# Patient Record
Sex: Male | Born: 1940 | Race: White | Hispanic: No | Marital: Married | Smoking: Current some day smoker
Health system: Southern US, Community
[De-identification: ages and names within clinical notes are randomized; demographics above are authoritative.]

## PROBLEM LIST (undated history)

## (undated) DIAGNOSIS — T8859XA Other complications of anesthesia, initial encounter: Secondary | ICD-10-CM

## (undated) DIAGNOSIS — T4145XA Adverse effect of unspecified anesthetic, initial encounter: Secondary | ICD-10-CM

## (undated) DIAGNOSIS — E119 Type 2 diabetes mellitus without complications: Secondary | ICD-10-CM

## (undated) DIAGNOSIS — G4730 Sleep apnea, unspecified: Secondary | ICD-10-CM

## (undated) DIAGNOSIS — K219 Gastro-esophageal reflux disease without esophagitis: Secondary | ICD-10-CM

## (undated) DIAGNOSIS — F32A Depression, unspecified: Secondary | ICD-10-CM

## (undated) DIAGNOSIS — M1990 Unspecified osteoarthritis, unspecified site: Secondary | ICD-10-CM

## (undated) DIAGNOSIS — I2510 Atherosclerotic heart disease of native coronary artery without angina pectoris: Secondary | ICD-10-CM

## (undated) DIAGNOSIS — F329 Major depressive disorder, single episode, unspecified: Secondary | ICD-10-CM

## (undated) DIAGNOSIS — M549 Dorsalgia, unspecified: Secondary | ICD-10-CM

## (undated) DIAGNOSIS — I214 Non-ST elevation (NSTEMI) myocardial infarction: Secondary | ICD-10-CM

## (undated) DIAGNOSIS — G8929 Other chronic pain: Secondary | ICD-10-CM

## (undated) DIAGNOSIS — I10 Essential (primary) hypertension: Secondary | ICD-10-CM

## (undated) DIAGNOSIS — M797 Fibromyalgia: Secondary | ICD-10-CM

## (undated) DIAGNOSIS — IMO0001 Reserved for inherently not codable concepts without codable children: Secondary | ICD-10-CM

## (undated) DIAGNOSIS — Z87891 Personal history of nicotine dependence: Secondary | ICD-10-CM

## (undated) DIAGNOSIS — E785 Hyperlipidemia, unspecified: Secondary | ICD-10-CM

## (undated) DIAGNOSIS — K760 Fatty (change of) liver, not elsewhere classified: Secondary | ICD-10-CM

## (undated) DIAGNOSIS — F419 Anxiety disorder, unspecified: Secondary | ICD-10-CM

## (undated) HISTORY — PX: OTHER SURGICAL HISTORY: SHX169

## (undated) HISTORY — PX: SHOULDER SURGERY: SHX246

## (undated) HISTORY — PX: ROTATOR CUFF REPAIR: SHX139

## (undated) HISTORY — DX: Depression, unspecified: F32.A

## (undated) HISTORY — PX: CHOLECYSTECTOMY: SHX55

## (undated) HISTORY — DX: Atherosclerotic heart disease of native coronary artery without angina pectoris: I25.10

## (undated) HISTORY — PX: PAROTIDECTOMY: SUR1003

## (undated) HISTORY — DX: Hyperlipidemia, unspecified: E78.5

## (undated) HISTORY — PX: TRANSURETHRAL RESECTION OF PROSTATE: SHX73

## (undated) HISTORY — PX: NASAL SINUS SURGERY: SHX719

## (undated) HISTORY — DX: Fatty (change of) liver, not elsewhere classified: K76.0

## (undated) HISTORY — DX: Unspecified osteoarthritis, unspecified site: M19.90

## (undated) HISTORY — DX: Major depressive disorder, single episode, unspecified: F32.9

---

## 2004-07-07 ENCOUNTER — Ambulatory Visit (HOSPITAL_COMMUNITY): Admission: RE | Admit: 2004-07-07 | Discharge: 2004-07-07 | Payer: Self-pay | Admitting: Orthopedic Surgery

## 2004-08-18 ENCOUNTER — Ambulatory Visit (HOSPITAL_COMMUNITY): Admission: RE | Admit: 2004-08-18 | Discharge: 2004-08-18 | Payer: Self-pay | Admitting: Orthopedic Surgery

## 2004-09-29 ENCOUNTER — Encounter: Admission: RE | Admit: 2004-09-29 | Discharge: 2004-09-29 | Payer: Self-pay | Admitting: Orthopedic Surgery

## 2004-10-12 ENCOUNTER — Encounter: Admission: RE | Admit: 2004-10-12 | Discharge: 2004-10-12 | Payer: Self-pay | Admitting: Orthopedic Surgery

## 2004-10-29 ENCOUNTER — Encounter: Admission: RE | Admit: 2004-10-29 | Discharge: 2004-10-29 | Payer: Self-pay | Admitting: Orthopedic Surgery

## 2005-04-01 ENCOUNTER — Ambulatory Visit (HOSPITAL_COMMUNITY): Admission: RE | Admit: 2005-04-01 | Discharge: 2005-04-01 | Payer: Self-pay | Admitting: Orthopedic Surgery

## 2006-01-03 ENCOUNTER — Observation Stay (HOSPITAL_COMMUNITY): Admission: AD | Admit: 2006-01-03 | Discharge: 2006-01-04 | Payer: Self-pay | Admitting: Neurological Surgery

## 2006-10-07 ENCOUNTER — Emergency Department (HOSPITAL_COMMUNITY): Admission: EM | Admit: 2006-10-07 | Discharge: 2006-10-07 | Payer: Self-pay | Admitting: Emergency Medicine

## 2007-04-05 ENCOUNTER — Ambulatory Visit: Payer: Self-pay | Admitting: Internal Medicine

## 2007-04-11 ENCOUNTER — Ambulatory Visit: Payer: Self-pay | Admitting: Gastroenterology

## 2007-04-11 ENCOUNTER — Encounter: Payer: Self-pay | Admitting: Gastroenterology

## 2007-04-15 ENCOUNTER — Emergency Department (HOSPITAL_COMMUNITY): Admission: EM | Admit: 2007-04-15 | Discharge: 2007-04-15 | Payer: Self-pay | Admitting: Family Medicine

## 2007-08-31 ENCOUNTER — Encounter (INDEPENDENT_AMBULATORY_CARE_PROVIDER_SITE_OTHER): Payer: Self-pay | Admitting: *Deleted

## 2007-08-31 ENCOUNTER — Observation Stay (HOSPITAL_COMMUNITY): Admission: EM | Admit: 2007-08-31 | Discharge: 2007-08-31 | Payer: Self-pay | Admitting: Emergency Medicine

## 2008-03-08 ENCOUNTER — Ambulatory Visit (HOSPITAL_BASED_OUTPATIENT_CLINIC_OR_DEPARTMENT_OTHER): Admission: RE | Admit: 2008-03-08 | Discharge: 2008-03-08 | Payer: Self-pay | Admitting: Otolaryngology

## 2008-03-08 ENCOUNTER — Encounter (INDEPENDENT_AMBULATORY_CARE_PROVIDER_SITE_OTHER): Payer: Self-pay | Admitting: Otolaryngology

## 2009-06-02 ENCOUNTER — Ambulatory Visit: Payer: Self-pay | Admitting: Surgery

## 2009-06-19 ENCOUNTER — Encounter: Admission: RE | Admit: 2009-06-19 | Discharge: 2009-06-19 | Payer: Self-pay | Admitting: Internal Medicine

## 2009-06-25 ENCOUNTER — Encounter: Admission: RE | Admit: 2009-06-25 | Discharge: 2009-06-25 | Payer: Self-pay | Admitting: Internal Medicine

## 2009-10-06 ENCOUNTER — Encounter: Admission: RE | Admit: 2009-10-06 | Discharge: 2009-10-06 | Payer: Self-pay | Admitting: Neurological Surgery

## 2010-04-16 ENCOUNTER — Encounter: Admission: RE | Admit: 2010-04-16 | Discharge: 2010-04-16 | Payer: Self-pay | Admitting: Internal Medicine

## 2010-08-12 ENCOUNTER — Emergency Department (HOSPITAL_COMMUNITY)
Admission: EM | Admit: 2010-08-12 | Discharge: 2010-08-12 | Disposition: A | Discharge status: Home / Self Care | Payer: Medicare Other | Attending: Emergency Medicine | Admitting: Emergency Medicine

## 2010-08-12 ENCOUNTER — Emergency Department (HOSPITAL_COMMUNITY): Payer: Medicare Other

## 2010-08-12 DIAGNOSIS — I4460 Unspecified fascicular block: Secondary | ICD-10-CM | POA: Insufficient documentation

## 2010-08-12 DIAGNOSIS — IMO0001 Reserved for inherently not codable concepts without codable children: Secondary | ICD-10-CM | POA: Insufficient documentation

## 2010-08-12 DIAGNOSIS — G319 Degenerative disease of nervous system, unspecified: Secondary | ICD-10-CM | POA: Insufficient documentation

## 2010-08-12 DIAGNOSIS — G4730 Sleep apnea, unspecified: Secondary | ICD-10-CM | POA: Insufficient documentation

## 2010-08-12 DIAGNOSIS — R42 Dizziness and giddiness: Secondary | ICD-10-CM | POA: Insufficient documentation

## 2010-08-12 DIAGNOSIS — E860 Dehydration: Secondary | ICD-10-CM | POA: Insufficient documentation

## 2010-08-12 LAB — CBC
HCT: 43.5 % (ref 39.0–52.0)
Hemoglobin: 15 g/dL (ref 13.0–17.0)
MCV: 90.6 fL (ref 78.0–100.0)
RDW: 13.3 % (ref 11.5–15.5)
WBC: 5.4 10*3/uL (ref 4.0–10.5)

## 2010-08-12 LAB — BASIC METABOLIC PANEL
BUN: 24 mg/dL — ABNORMAL HIGH (ref 6–23)
Calcium: 9.1 mg/dL (ref 8.4–10.5)
Chloride: 105 mEq/L (ref 96–112)
Creatinine, Ser: 0.95 mg/dL (ref 0.4–1.5)
GFR calc Af Amer: >60 mL/min (ref >60)
GFR calc non Af Amer: >60 mL/min (ref >60)

## 2010-08-12 LAB — URINALYSIS, ROUTINE W REFLEX MICROSCOPIC
Ketones, ur: NEGATIVE mg/dL
Protein, ur: NEGATIVE mg/dL
Urobilinogen, UA: 1 mg/dL (ref 0.0–1.0)

## 2010-08-12 LAB — DIFFERENTIAL
Basophils Absolute: 0 10*3/uL (ref 0.0–0.1)
Eosinophils Relative: 3 % (ref 0–5)
Lymphocytes Relative: 29 % (ref 12–46)
Lymphs Abs: 1.6 10*3/uL (ref 0.7–4.0)
Neutro Abs: 3.1 10*3/uL (ref 1.7–7.7)

## 2010-08-12 LAB — POCT CARDIAC MARKERS
CKMB, poc: <1 ng/mL — ABNORMAL LOW (ref 1.0–8.0)
Troponin i, poc: <0.05 ng/mL (ref 0.00–0.09)

## 2010-08-12 LAB — PROTIME-INR: INR: 0.97 (ref 0.00–1.49)

## 2010-08-12 LAB — GLUCOSE, CAPILLARY: Glucose-Capillary: 109 mg/dL — ABNORMAL HIGH (ref 70–99)

## 2010-10-13 NOTE — Discharge Summary (Signed)
NAME:  Timothy Cervantes, Timothy Cervantes              ACCOUNT NO.:  [id number]   MEDICAL RECORD NO.:  [id number]          PATIENT TYPE:  INP   LOCATION:  4729                         FACILITY:  [facility]   PHYSICIAN:  Thomasenia Bottoms, MDDATE OF BIRTH:  [birth year]   DATE OF ADMISSION:  08/31/2007  DATE OF DISCHARGE:  08/31/2007                               DISCHARGE SUMMARY   ADDENDUM   FOLLOWUP INSTRUCTIONS:  We will ask the patient's primary care physician  to follow up on the patient's echocardiogram, which is still pending at  the time of discharge.  The patient says he does have a known leaky  heart valve.      Thomasenia Bottoms, MD  Electronically Signed     CVC/MEDQ  D:  08/31/2007  T:  09/01/2007  Job:  [id number]

## 2010-10-13 NOTE — Op Note (Signed)
NAME:  Timothy Cervantes, Timothy Cervantes              ACCOUNT NO.:  [id number]   MEDICAL RECORD NO.:  [id number]          PATIENT TYPE:  AMB   LOCATION:  DSC                          FACILITY:  [facility]   PHYSICIAN:  Christopher E. 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   POSTOPERATIVE DIAGNOSIS:  Hoarseness with left true vocal cord lesion.   OPERATION PERFORMED:  Microlaryngoscopy with excision of left vocal cord  lesion.   SURGEON:  Kristine Garbe. Ezzard Standing, MD   ANESTHESIA:  General endotracheal.   COMPLICATIONS:  None.   CLINICAL NOTE:  The Timothy Cervantes is a 70 year old gentleman who  presents with sore throat and intermittent hoarseness.  He has a  significant smoking history.  On examination in the office, he has a  lesion arising from the left mid true vocal cord.  Remaining exam is  clear.  He does have history of reflux problems.  He is taken to the  operating room at this time for microlaryngoscopy and excision of left  vocal cord lesion.   DESCRIPTION OF PROCEDURE:  After adequate endotracheal anesthesia,  direct laryngoscopy was performed.  On direct laryngoscopy, the  endotracheal tube was partially extruded and the patient was reintubated  over a guide without difficulty.  Following this, direct laryngoscopy  was performed.  The laryngoscope was suspended.  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  This was excised from the vocal core using scissors.  It was sent  to pathology.  Hemostasis was obtained with cotton pledgets soaked in  adrenaline.  Remaining AE folds, false cords were clear.  The right  vocal cord was normal.  Anterior commissure was clear.  Subglottic area  was clear.  Both pyriform sinuses were clear.  Palpation at the base of  tongue  was soft.  Epiglottis and the base of tongue appeared normal.  This completed the procedure.  Haeden was awoke from anesthesia and  transferred to the recovery room, postop doing well.   DISPOSITION:  Timothy Cervantes is discharged home later this morning on antacid,  Nexium b.i.d. for the next month and Tylenol p.r.n. pain.  Timothy Cervantes will  follow up in my office in 10 days for recheck.           ______________________________  Kristine Garbe. Ezzard Standing, M.D.     CEN/MEDQ  D:  03/08/2008  T:  03/08/2008  Job:  [id number]

## 2010-10-13 NOTE — Discharge Summary (Signed)
NAME:  Timothy Cervantes, Timothy Cervantes              ACCOUNT NO.:  [id number]   MEDICAL RECORD NO.:  [id number]          PATIENT TYPE:  INP   LOCATION:  4729                         FACILITY:  [facility]   PHYSICIAN:  Thomasenia Bottoms, MDDATE OF BIRTH:  1941/01/27   DATE OF ADMISSION:  08/31/2007  DATE OF DISCHARGE:  08/31/2007                               DISCHARGE SUMMARY   SUMMARY OF HOSPITAL COURSE:  Mr. [name] is a 70 year old gentleman who  had some chest pressure on arrival, the patient was admitted to a  telemetry unit.  He was ruled out for MI by cardiac enzymes.  He had no  further chest pressure and did quite well from the cardiac standpoint.  He did not have any arrhythmias on telemetry.  The patient's second  complaint was testicular pain and ultrasound of his testicles was  ordered.  The ultrasound revealed a small right hydrocele and a small  right varicocele.  This was discussed with the urologist.  It was  recommended to the patient to take 3 weeks of p.o. Cipro and then  followup with urology for a followup ultrasound.  A low level  epididymitis could be the cause.  The ultrasound was negative for any  torsion or masses.  The patient did have a slight testicular injury  recently.   DISCHARGE DIAGNOSES:  1. Chest pressure, ruled out for MI.  2. Right testicular hydrocele and varicocele, possible mild      epididymitis.  3. Known sleep apnea.  4. Tobacco abuse.  5. Fibromyalgia.   MEDICATIONS:  At the time of discharge include:  1. Cipro extended release 500 mg for 3 weeks.  2. Wellbutrin XR 150 mg daily, which he takes for fibromyalgia.  3. Ambien 10 mg p.o. nightly.   DISCHARGE INSTRUCTIONS:  The patient was advised to quit smoking.   FOLLOWUP:  He should follow up with his [facility] physician  within 3 weeks to have a repeat ultrasound.  He should follow up with  Dr. Ricki Miller, his primary care physician.      Thomasenia Bottoms, MD  Electronically Signed     CVC/MEDQ  D:  08/31/2007  T:  09/01/2007  Job:  [id number]   cc:   [name], M.D.
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
## 2010-10-13 NOTE — H&P (Signed)
NAME:  Timothy Cervantes, [name]              ACCOUNT NO.:  [id number]   MEDICAL RECORD NO.:  [id number]          PATIENT TYPE:  INP   LOCATION:  4729                         FACILITY:  [facility]   PHYSICIAN:  Beckey Rutter, MD  DATE OF BIRTH:  02-14-1941   DATE OF ADMISSION:  08/31/2007  DATE OF DISCHARGE:  06/23/2007                              HISTORY & PHYSICAL   PRIMARY CARE PHYSICIAN:  Dr. Brennan Bailey.   CHIEF COMPLAINT:  Chest pain   HISTORY OF PRESENT ILLNESS:  This is a 70 year old Caucasian male with  past medical history significant for fibromyalgia and sleep apnea who  came today because of chest discomfort.  The chest pain started around  four hours ago and continued for three hours.  The pressure started  towards back going forwards.  This discomfort and pressure is associated  with some warm sensation on the upper part of his body.  No palpitation,  no sweating.  The pain is relieved currently, but the pain has not  changed after nitroglycerin was given.  There is no radiation factor.  The patient does not remember any significant activity he was doing when  the pain was started.  No similar complaint or pain like this  previously.   PAST MEDICAL HISTORY:  1. Fibromyalgia.  2. Sleep apnea.   MEDICATION ALLERGIES:  Not known to have medication allergy.   MEDICATIONS:  Wellbutrin and Ambien h.s.   SOCIAL HISTORY:  Current smoker.  No drug abuse.  Occasional drinker.   FAMILY HISTORY:  Noncontributory.   REVIEW OF SYSTEMS:  A 12-point review of systems is not significant  otherwise as per HPI.   PHYSICAL EXAMINATION:  VITAL SIGNS:  Temperature 98.4, blood pressure  116/65, pulse 58, respiratory rate is 18.  HEENT:  Head atraumatic, normocephalic.  Eyes: PERRL.  Mouth moist.  No  ulcer.  NECK:  Supple, no JVD.  Precordium first and second heart sound.  No  added sound appreciated.  LUNGS:  Bilateral decreased air entry.  ABDOMEN:  Soft, nontender.  EXTREMITIES:  No lower  extremity edema.  NEUROLOGICALLY:  Alert, oriented x3.  Given history, moving all his  extremities spontaneously.   LABORATORY DATA:  Labs and x-ray:  EKG showing sinus rhythm with  ventricular rate of 70 beats per minute.  Left axis deviation and  incomplete right bundle branch block.  The patient had nonspecific ST  and T-wave changes.  There is a previous EKG dated March 29, 2008.  That EKG was showing nonspecific ST and T-wave change.  D-dimer is 0.48.  chest x-ray showing mild airway thickening, suggest bronchitis or  reactive airway disease.  No pneumonia is identified.  White blood count  is 6.6, hemoglobin is 15.6, hematocrit is 45.0, platelet count is 264.  Troponin is less than 0.05, CK-MB is 2.1 and myoglobin is 51.1.  Sodium  is 138, potassium 3.8, chloride is 104, bicarb is 22, BUN is 22,  creatinine is 1.2, glucose 101.   ASSESSMENT:  This is 70 year old male with history of fibromyalgia and  sleep apnea who presented today with chest pressure.  The differential  include bronchitis or acute coronary syndromes.   PLAN:  1. The patient will be admitted for further assessment and management.  2. Will admit the patient to telemetry floor and he will have EKG      every 8 hours.  3. The patient will have cardiac enzymes cycled.  4. The patient will be given aspirin and sublingual nitroglycerin on a      p.r.n. basis.  5. We will obtain 2-D echo during hospitalization.  6. The patient will be continued on Wellbutrin during the hospital      stay.  7. For DVT prophylaxis will start Lovenox.  8. For GI prophylaxis will start Protonix.      Beckey Rutter, MD  Electronically Signed     EME/MEDQ  D:  08/31/2007  T:  08/31/2007  Job:  [id number]

## 2010-10-13 NOTE — Discharge Summary (Signed)
NAME:  Timothy Cervantes, EKSTEIN              ACCOUNT NO.:  [id number]   MEDICAL RECORD NO.:  [id number]          PATIENT TYPE:  INP   LOCATION:  4729                         FACILITY:  [facility]   PHYSICIAN:  Thomasenia Bottoms, MDDATE OF BIRTH:  1940-08-07   DATE OF ADMISSION:  08/31/2007  DATE OF DISCHARGE:  08/31/2007                               DISCHARGE SUMMARY   ADDENDUM   The patient did have a echocardiogram done during this hospital stay,  but the results are still pending at the time of discharge.  We will ask  Dr. Ricki Miller to follow up on his echocardiogram results at the time of post  discharge.  The patient does have a   Dictation ended at this point.      Thomasenia Bottoms, MD  Electronically Signed     CVC/MEDQ  D:  08/31/2007  T:  09/01/2007  Job:  [id number]

## 2010-10-16 NOTE — H&P (Signed)
NAME:  Timothy Cervantes              ACCOUNT NO.:  [id number]   MEDICAL RECORD NO.:  [id number]          PATIENT TYPE:  INP   LOCATION:  3027                         FACILITY:  [facility]   PHYSICIAN:  Stefani Dama, M.D.  DATE OF BIRTH:  07-Apr-1941   DATE OF ADMISSION:  01/03/2006  DATE OF DISCHARGE:                                HISTORY & PHYSICAL   ADMISSION DIAGNOSIS:  Thoracic and lumbar spondylosis, nephrolithiasis.   HISTORY OF PRESENT ILLNESS:  Timothy Cervantes is a 70 year old right-handed  individual who has been followed by me for problems with lumbar spondylosis  and lumbar radiculopathy involving the right lower extremity.  A week ago a  underwent an extraforaminal nerve block, which gave him good relief of his  right lumbar radicular pain.  On Saturday he developed rather acute onset of  spasm in the region of the right flank and he notes that the spasms have  been occurring in waves since that time.  He notes that he holds perfectly  still he can be comfortable for a brief period of time but then when the  spasms grasp him, they radiate up and down the flank on the right side.  He  notes that he has had some nausea but has not vomited.  Pain has been severe  and unrelenting.  It has been unresponsive to the use of cyclobenzaprine.  He contacted me this morning and I visited with him in the office and  obtained an AP and lateral radiograph of the thoracolumbar spine.  Radiographs demonstrate that he has spondylitic changes in the upper  thoracic spine, namely at T7, T8, T9 with some slight collapse that appears  chronic in nature.  There is spondylitic ridging from the ventral aspect of  the vertebral bodies.  I do not appreciate an acute fracture on these  radiographs.  In the lumbar spine off the lateral edge of the L2 vertebrae  there may be either a small stone or possibly a bony spur.  His alignment in  the coronal and sagittal planes is quite good otherwise, and I do not  see  any other acute process.  The patient is in severe and unrelenting pain.  He  is unable to stand straight and erect without great difficulty.  He has  acute tenderness to percussion.  He is now being admitted to undergo further  workup.   PAST MEDICAL HISTORY:  He has the recent diagnosis of diabetes mellitus that  is largely diet-controlled.  He has been on some oral hypoglycemics in the  recent past.  He tells me that he had about a 60-pound weight loss in the  past 8 months because of the diagnosis.  He does smoke about a half pack of  cigarettes a day, does not drink alcohol.   His systems review is notable for the pain in the right flank, chronic pain  in a right radicular distribution of the L5 nerve root, which is treated  intermittently with nerve blocks.   PHYSICAL EXAMINATION:  MUSCULOSKELETAL/NEUROLOGIC:  His physical exam at  this time reveals that he is  alert, oriented and in moderate to severe  distress, hunched over with back pain.  When he has a spasm of back pain, it  literally brings him to his knees.  The patient writhes with pain until the  spasm passes, which may last anywhere 15 seconds to a minute or so.  In  between spasms he notes that there is a chronic undulating pain in the  region of the right flank.  There is no direct tenderness to palpation  either along the spinous processes of the lumbar spine, but on percussion  there is direct tenderness in the flank on the right side.  The Left side  has no such tenderness.  His motor strength in the lower extremities is good  in the iliopsoas, the quadriceps, tibialis anterior and the gastrocs.  The  deep tendon reflexes are 2+ in the patellae, 1+ in the Achilles.  Babinski  is downgoing.  Upper extremity strength and reflexes are within limits of  normal.  LUNGS:  Clear to auscultation.  CARDIAC:  Heart has regular rate and rhythm.  ABDOMEN:  Soft.  Bowel sounds positive.  No masses are noted.  EXTREMITIES:   No cyanosis, clubbing or edema.   IMPRESSION:  The patient has evidence of right flank pain, which is quite  severe.  I strongly suspect this is related to nephrolithiasis.  The patient  will be admitted now for pain control with parenteral medications in  addition to undergoing a CT scan of the abdomen and pelvis to rule out a  kidney stone.  A UA will also be performed.  Will also obtain some baseline  blood work including a CBC and a CMET.  [facility].  The patient did have a visit with Dr. Retta Diones about 2 years ago for an  episode of painless hematuria.  Apparently his workup was negative at that  time.  We will see how he does with the parenteral control of pain and CT  scan.      Stefani Dama, M.D.  Electronically Signed     HJE/MEDQ  D:  01/03/2006  T:  01/03/2006  Job:  [id number]

## 2010-10-16 NOTE — Op Note (Signed)
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   PROCEDURE:  Left shoulder arthroscopy with debridement of the flap tear,  debridement of rotator cuff tear, debridement of partial subscapularis tear  and subacromial decompression.   SURGEON:  Nadara Mustard, MD   ANESTHESIA:  General plus interscalene block.   ESTIMATED BLOOD LOSS:  Minimal.   DISPOSITION:  To PACU in stable condition.  Plan for discharge to home and  followup in office in 2 weeks.   HISTORY OF PRESENT ILLNESS:  The patient is a 70 year old gentleman with  impingement symptoms of his left shoulder.  The patient has failed  conservative care presents at this time for arthroscopic intervention.  The  risks and benefits were discussed including infection, neurovascular injury,  persistent pain, and need for additional surgery.  The patient states he  understands and wishes proceed at this time.   DESCRIPTION OF PROCEDURE:  The patient was brought to OR room #5 and  underwent a general anesthetic after an interscalene block.  After adequate  level of anesthesia was obtained, the patient was placed in the beach-chair  position and his left upper extremity was prepped using DuraPrep and draped  into a sterile field.  The scope was inserted through the posterior portal  and an anterior portal was established from an outside-in technique  with an  18-gauge spinal and a plastic cannula was placed through the anterior  portal.  Visualization showed multiple areas of synovitis and partial  tearing of the rotator cuff.  This was debrided.  There was a good  attachment at the humeral head of the rotator cuff and this was stable.  The  patient had some tearing of the attachment of the biceps as well as a SLAP  lesion; this was debrided back to a healthy viable edge.  There was also  some partial tearing of the subscapularis and this was also debrided.  The  rotator interval was debrided of synovitis.  After hemostasis was obtained  with the electrocautery, the instruments were removed and the lateral portal  was established and a posterior portal was established for the subacromial  space.  Visualization showed a very hooked type 3 acromion with very tight  subacromial space.  A bursectomy was performed.  A subacromial decompression  was performed.  The electrocautery was used for hemostasis.  The instruments  were removed.  The portals were closed using 2-0 nylon.  The wounds were covered with Adaptic, orthopedic sponges, ABD dressing  and  Hypafix tape.  The patient was extubated, placed in a sling and taken to the  PACU in stable condition.  Plan to follow up in office in 2 weeks and change  the dressing in 2 days.      Nadara Mustard, MD  Electronically Signed     MVD/MEDQ  D:  04/01/2005  T:  04/02/2005  Job:  [id number]

## 2010-10-16 NOTE — Consult Note (Signed)
NAME:  Timothy Cervantes, Timothy Cervantes              ACCOUNT NO.:  [id number]   MEDICAL RECORD NO.:  [id number]          PATIENT TYPE:  INP   LOCATION:  3027                         FACILITY:  [facility]   PHYSICIAN:  Bertram Millard. Dahlstedt, M.D.DATE OF BIRTH:  10-03-1940   DATE OF CONSULTATION:  01/03/2006  DATE OF DISCHARGE:                                   CONSULTATION   REASON FOR CONSULTATION:  Right-sided back pain.   BRIEF HISTORY AND PHYSICAL:  A 70 year old male who was admitted to the  hospital today with significant severe right flank pain.  Has been 3 days in  duration.  It is crampy at times.  It is rarely associate with nausea and he  has not had any vomiting.  He has had no fever, chills, no gross hematuria,  no change in voiding pattern.   I saw him about 2 years ago.  At that time he had pain with gross hematuria.  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  These were dilated in the office.  He has actually done well since that time and has not had any recurrent  urinary symptomatology (he was having a slow stream).  He was admitted to  the hospital today by Dr. Danielle Dess for significant pain.  He has been placed  on a PCA pump.  He has had some resolution of his pain.  CT urogram was  performed to rule out renal calculi.   PAST MEDICAL HISTORY:  Significant for BPH.  He is status post TURP in  [place] many, many years ago.  He is left with a bladder neck  contracture.  This was dilated at the last time 2 years ago.  He has had  shoulder surgery on both rotator cuffs, cholecystectomy, right leg surgery  several times in the past, and prior lumbar laminectomy.  He has spondylosis  of the lumbar spine.  He has just completed a series of epidural steroids by  Dr. Danielle Dess.   MEDICATIONS:  At the time of this dictation include Toradol, morphine, PCA,  and Valium.   SOCIAL HISTORY:  The patient does not use alcohol.  He  does use tobacco.  He  is married.   He denies any current nausea or vomiting.  He has had no change in his  urinary pattern.  He has not had any change in his bowel pattern.   EXAMINATION:  Pleasant, middle-aged male.  He was slightly groggy but in no  distress.  He had no CVA tenderness.  There were no flank masses.  Abdomen  was flat, soft, nondistended, nontender.  No mass, no organomegaly.  No  rebound or guarding.  His skin was warm.   LABORATORY DATA:  Urinalysis was clear, BMET, and CBC were basically normal.   I reviewed the patient's CT urogram.  This revealed no evident  hydronephrosis, no renal or ureteral calculi.  Bladder appeared normal.  Perhaps some calcifications in his prostate.   IMPRESSION:  1. Right flank pain, not of genitourinary etiology.  2.  History of a BPH, status post transurethral resection of the prostate,      with history of bladder neck contracture, dilated in the past and      voiding well.   PLAN:  1. I reassured the patient that no urologic evaluation or intervention is      needed at the present time.  2. He will come back to the office p.r.n. time.      Bertram Millard. Dahlstedt, M.D.  Electronically Signed     SMD/MEDQ  D:  01/03/2006  T:  01/04/2006  Job:  [id number]   cc:   [name], M.D.  Stefani Dama, M.D.

## 2011-02-23 LAB — COMPREHENSIVE METABOLIC PANEL
BUN: 16
CO2: 25
Calcium: 9
Chloride: 107
Creatinine, Ser: 0.95
GFR calc non Af Amer: >60
Glucose, Bld: 104 — ABNORMAL HIGH
Total Bilirubin: 0.7

## 2011-02-23 LAB — CK TOTAL AND CKMB (NOT AT ARMC): Total CK: 156

## 2011-02-23 LAB — APTT: aPTT: 34

## 2011-02-23 LAB — PROTIME-INR
INR: 1
Prothrombin Time: 13.2

## 2011-02-23 LAB — DIFFERENTIAL
Basophils Absolute: 0
Eosinophils Absolute: 0.2
Eosinophils Relative: 2
Lymphocytes Relative: 36
Lymphs Abs: 2.3
Neutrophils Relative %: 54

## 2011-02-23 LAB — POCT I-STAT, CHEM 8
BUN: 22
Creatinine, Ser: 1.2
Hemoglobin: 16.3
Potassium: 3.8
Sodium: 138
TCO2: 24

## 2011-02-23 LAB — POCT CARDIAC MARKERS
CKMB, poc: 2.1
Myoglobin, poc: 51.1
Operator id: 192351

## 2011-02-23 LAB — CBC
HCT: 43.7
MCV: 91.8
Platelets: 233
Platelets: 264
RDW: 13
RDW: 13.1
WBC: 5.3
WBC: 6.6

## 2011-02-23 LAB — CARDIAC PANEL(CRET KIN+CKTOT+MB+TROPI): CK, MB: 3.1

## 2011-02-23 LAB — TROPONIN I: Troponin I: <0.01

## 2011-05-05 IMAGING — CT CT ABD-PELV W/ CM
1 series · 16 of 32 positions shown, 20 images · IV contrast (omnipaque)
Comparison: 01/03/2006

CLINICAL DATA: Severe abdominal pain.  Right lower quadrant pain.
Evaluate for diverticulitis.

CT ABDOMEN AND PELVIS WITH CONTRAST
TECHNIQUE: Multidetector CT imaging of the abdomen and pelvis was
performed following the standard protocol during bolus
administration of intravenous contrast.
Contrast: 125 ml Omnipaque 300 IV.

[Series 2: renal delay · axial · delayed · 0.86mm/px · z∈[-199,-19]mm · 16 of 41 slices shown, 20 images]
[im 3/41  soft-tissue]
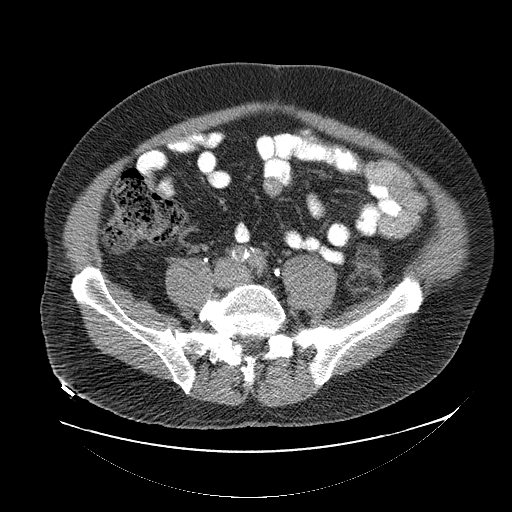
[im 3/41  bone]
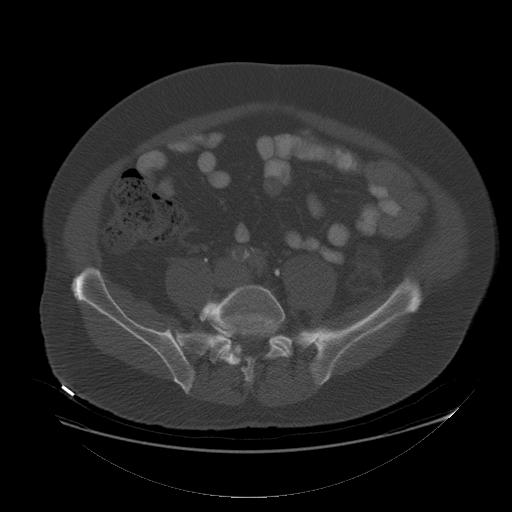
[im 6/41  soft-tissue]
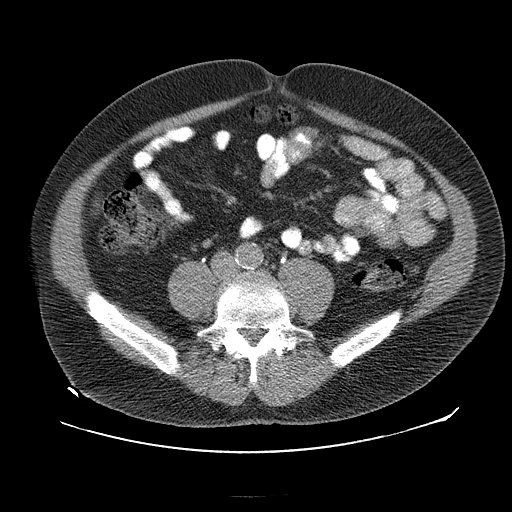
[im 8/41  soft-tissue]
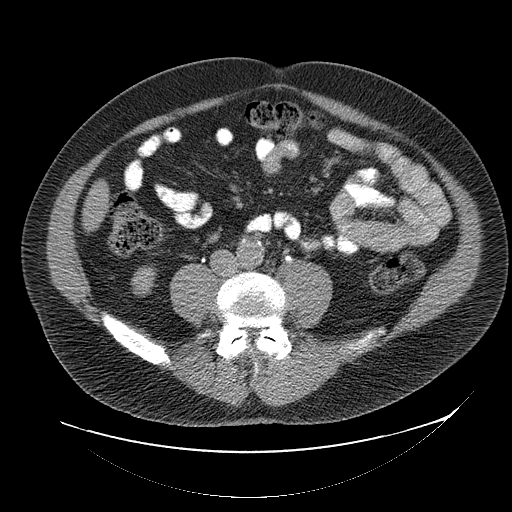
[im 11/41  soft-tissue]
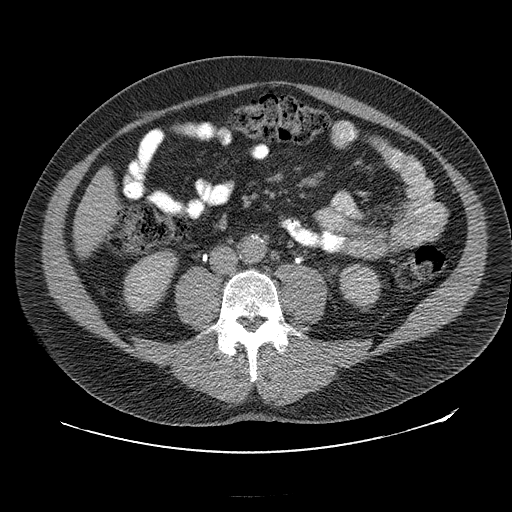
[im 13/41  soft-tissue]
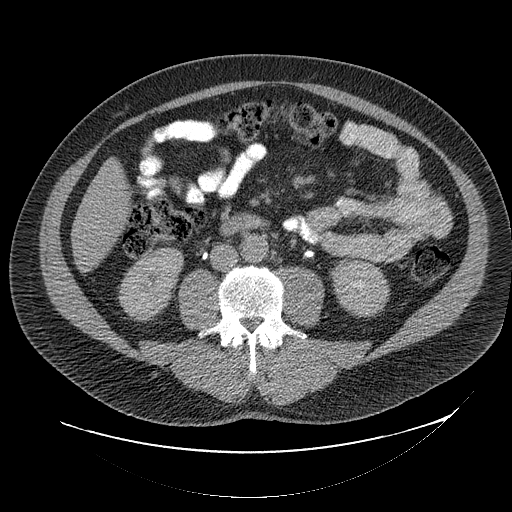
[im 16/41  soft-tissue]
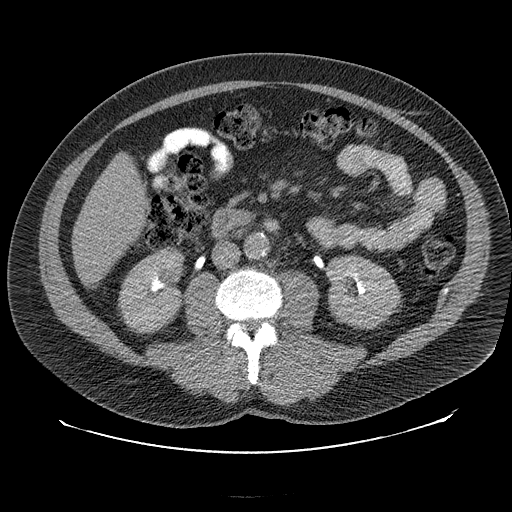
[im 19/41  soft-tissue]
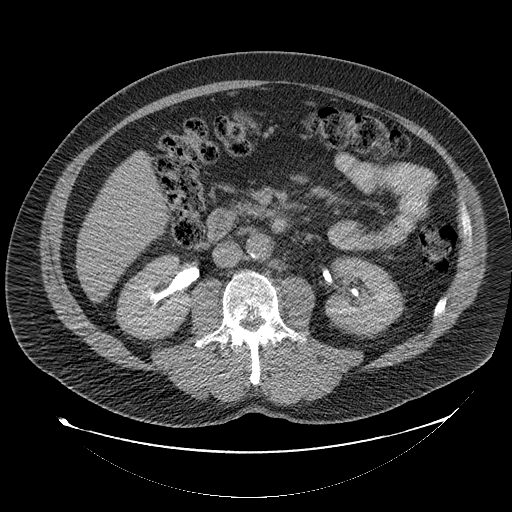
[im 22/41  soft-tissue]
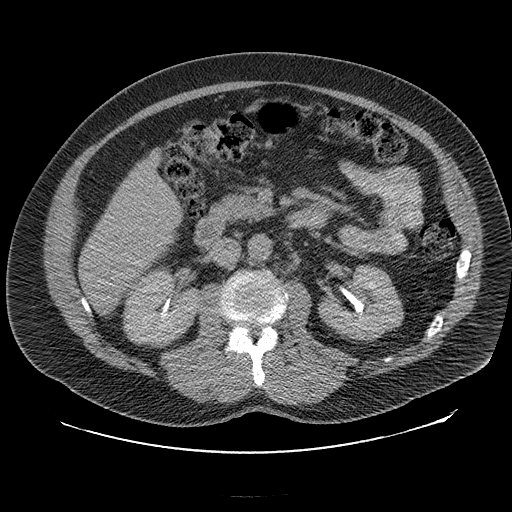
[im 25/41  soft-tissue]
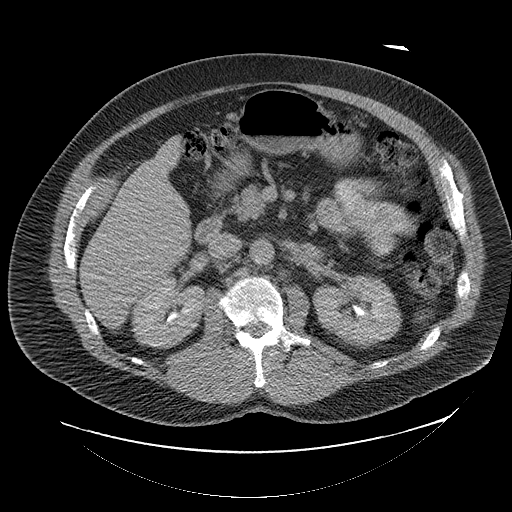
[im 25/41  bone]
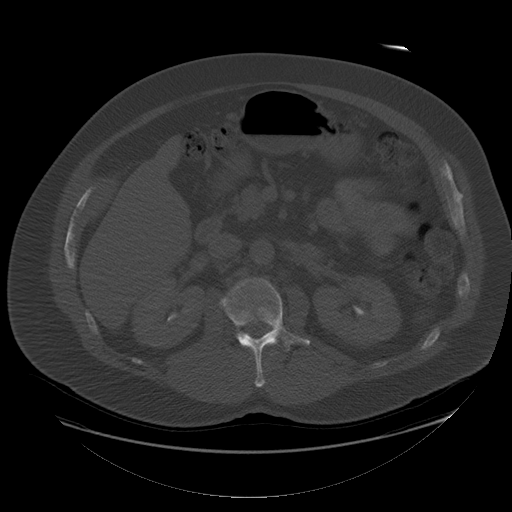
[im 28/41  soft-tissue]
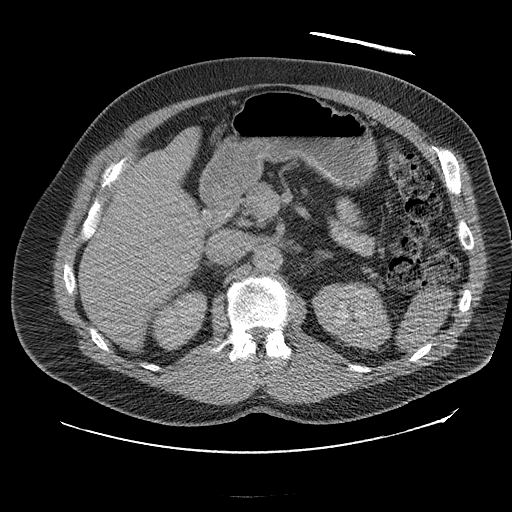
[im 30/41  soft-tissue]
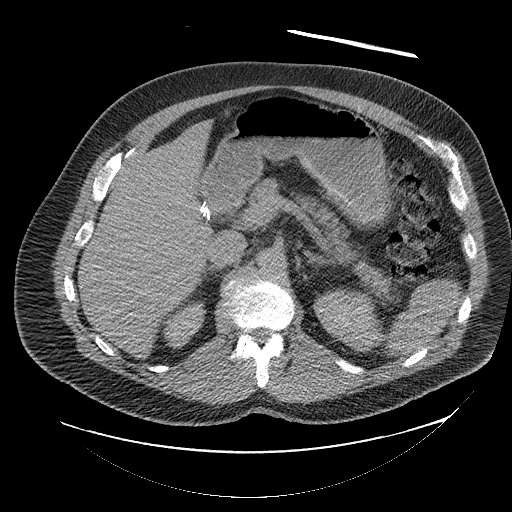
[im 33/41  soft-tissue]
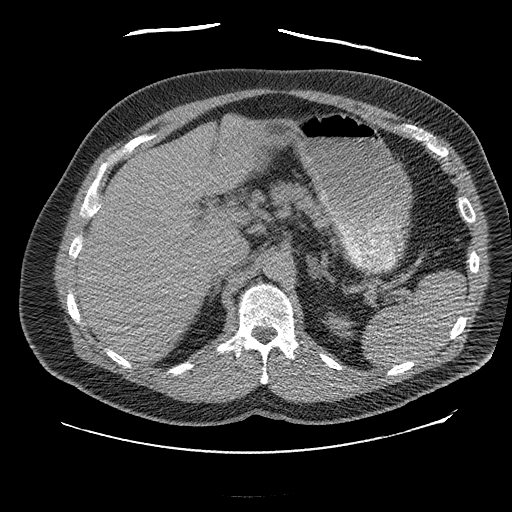
[im 35/41  soft-tissue]
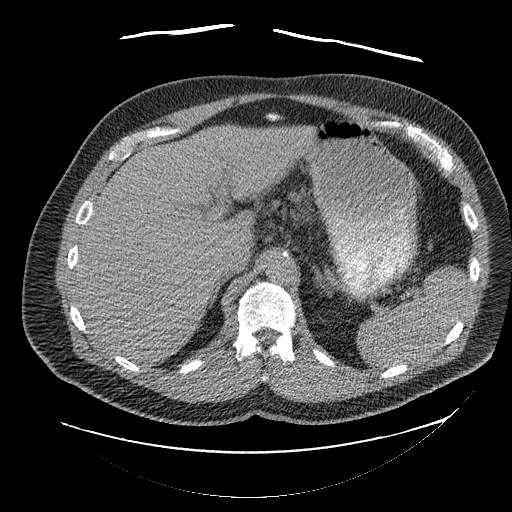
[im 35/41  lung]
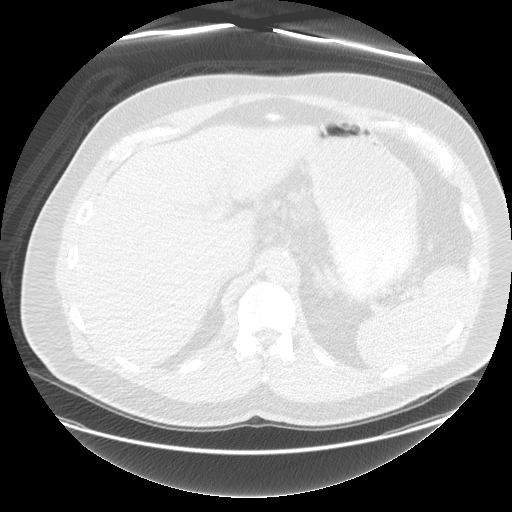
[im 37/41  lung]
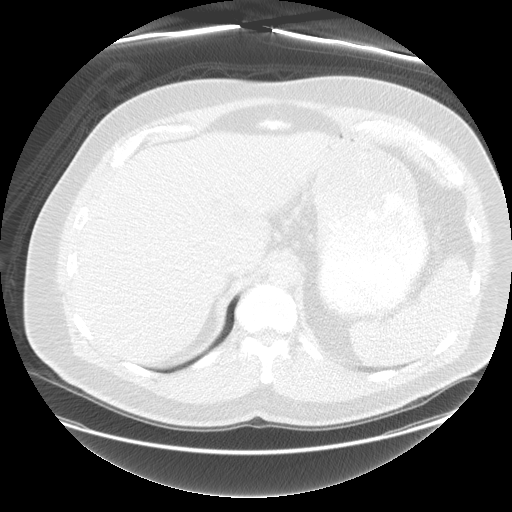
[im 38/41  soft-tissue]
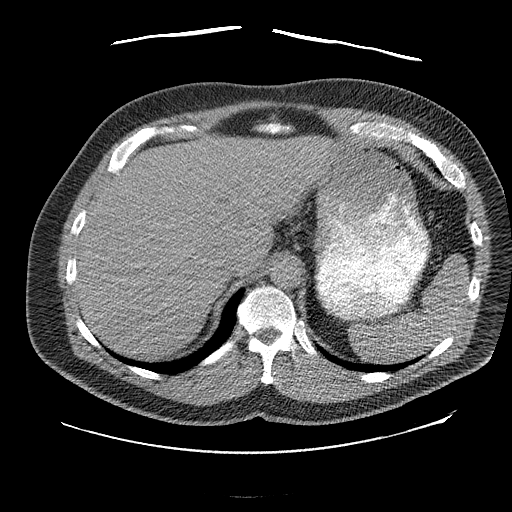
[im 38/41  lung]
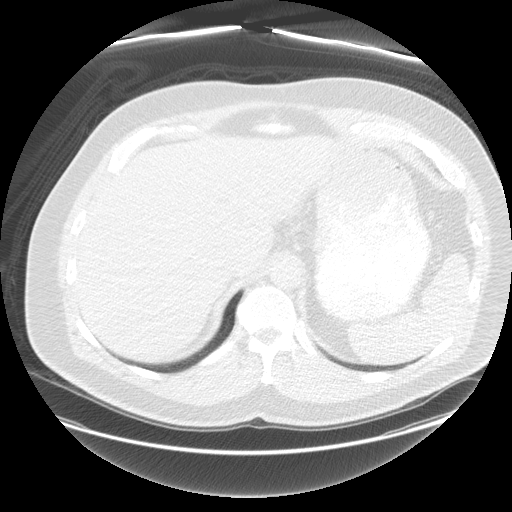
[im 39/41  lung]
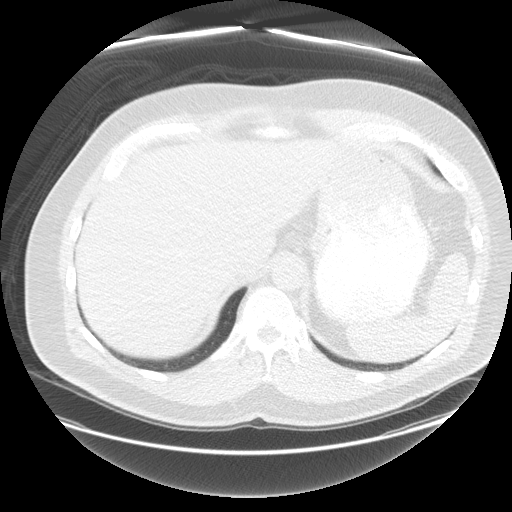

[16 of 32 positions shown; findings below may reference images not displayed]

FINDINGS: Lung bases are clear.  No effusions.  Heart is normal
size.

Prior cholecystectomy.  Slight fatty infiltration of the liver
suspected.  No focal lesion.  Spleen, pancreas, adrenals and
kidneys are unremarkable.

Appendix is visualized and is normal.  There is moderate stool
throughout the colon.  Colon otherwise unremarkable.  No
diverticulosis or diverticulitis.  Small bowel is unremarkable.

Urinary bladder, seminal vesicles and prostate are unremarkable.
No free fluid, free air or adenopathy.  Aorta is normal caliber.

No acute bony abnormality.
IMPRESSION: Prior cholecystectomy.

Moderate stool burden.

Suspect slight fatty infiltration of the liver.

No acute findings.

## 2012-03-31 ENCOUNTER — Encounter: Payer: Self-pay | Admitting: Gastroenterology

## 2012-04-14 ENCOUNTER — Other Ambulatory Visit: Payer: Self-pay | Admitting: Neurological Surgery

## 2012-04-14 DIAGNOSIS — M47816 Spondylosis without myelopathy or radiculopathy, lumbar region: Secondary | ICD-10-CM

## 2012-04-19 ENCOUNTER — Ambulatory Visit
Admission: RE | Admit: 2012-04-19 | Discharge: 2012-04-19 | Disposition: A | Discharge status: Home / Self Care | Payer: Medicare Other | Source: Ambulatory Visit | Attending: Neurological Surgery | Admitting: Neurological Surgery

## 2012-04-19 DIAGNOSIS — M47816 Spondylosis without myelopathy or radiculopathy, lumbar region: Secondary | ICD-10-CM

## 2012-04-19 MED ORDER — GADOBENATE DIMEGLUMINE 529 MG/ML IV SOLN
20.0000 mL | Freq: Once | INTRAVENOUS | Status: AC | PRN
  Administered 2012-04-19: 20 mL via INTRAVENOUS

## 2012-04-20 ENCOUNTER — Other Ambulatory Visit: Payer: Medicare Other

## 2012-05-10 ENCOUNTER — Encounter: Payer: Self-pay | Admitting: Cardiovascular Disease

## 2012-07-29 DIAGNOSIS — I214 Non-ST elevation (NSTEMI) myocardial infarction: Secondary | ICD-10-CM

## 2012-07-29 HISTORY — DX: Non-ST elevation (NSTEMI) myocardial infarction: I21.4

## 2012-08-23 ENCOUNTER — Other Ambulatory Visit: Payer: Self-pay

## 2012-08-23 ENCOUNTER — Emergency Department (HOSPITAL_COMMUNITY): Payer: Medicare Other

## 2012-08-23 ENCOUNTER — Encounter (HOSPITAL_COMMUNITY)
Admission: EM | Disposition: A | Discharge status: Home / Self Care | Payer: Self-pay | Source: Home / Self Care | Attending: Cardiothoracic Surgery

## 2012-08-23 ENCOUNTER — Inpatient Hospital Stay (HOSPITAL_COMMUNITY)
Admission: EM | Admit: 2012-08-23 | Discharge: 2012-08-31 | DRG: 234 | Disposition: A | Discharge status: Home / Self Care | Payer: Medicare Other | Attending: Cardiothoracic Surgery | Admitting: Cardiothoracic Surgery

## 2012-08-23 ENCOUNTER — Other Ambulatory Visit: Payer: Self-pay | Admitting: *Deleted

## 2012-08-23 ENCOUNTER — Encounter (HOSPITAL_COMMUNITY): Payer: Self-pay | Admitting: *Deleted

## 2012-08-23 DIAGNOSIS — I2510 Atherosclerotic heart disease of native coronary artery without angina pectoris: Secondary | ICD-10-CM

## 2012-08-23 DIAGNOSIS — Y9210 Unspecified residential institution as the place of occurrence of the external cause: Secondary | ICD-10-CM | POA: Diagnosis not present

## 2012-08-23 DIAGNOSIS — G8929 Other chronic pain: Secondary | ICD-10-CM | POA: Diagnosis present

## 2012-08-23 DIAGNOSIS — I200 Unstable angina: Secondary | ICD-10-CM | POA: Diagnosis present

## 2012-08-23 DIAGNOSIS — I472 Ventricular tachycardia, unspecified: Secondary | ICD-10-CM | POA: Diagnosis not present

## 2012-08-23 DIAGNOSIS — Z87891 Personal history of nicotine dependence: Secondary | ICD-10-CM

## 2012-08-23 DIAGNOSIS — M1990 Unspecified osteoarthritis, unspecified site: Secondary | ICD-10-CM | POA: Diagnosis present

## 2012-08-23 DIAGNOSIS — I959 Hypotension, unspecified: Secondary | ICD-10-CM | POA: Diagnosis not present

## 2012-08-23 DIAGNOSIS — Z8249 Family history of ischemic heart disease and other diseases of the circulatory system: Secondary | ICD-10-CM

## 2012-08-23 DIAGNOSIS — K929 Disease of digestive system, unspecified: Secondary | ICD-10-CM | POA: Diagnosis not present

## 2012-08-23 DIAGNOSIS — I498 Other specified cardiac arrhythmias: Secondary | ICD-10-CM | POA: Diagnosis not present

## 2012-08-23 DIAGNOSIS — R110 Nausea: Secondary | ICD-10-CM | POA: Diagnosis not present

## 2012-08-23 DIAGNOSIS — I2582 Chronic total occlusion of coronary artery: Secondary | ICD-10-CM | POA: Diagnosis present

## 2012-08-23 DIAGNOSIS — Z8052 Family history of malignant neoplasm of bladder: Secondary | ICD-10-CM

## 2012-08-23 DIAGNOSIS — I10 Essential (primary) hypertension: Secondary | ICD-10-CM | POA: Diagnosis present

## 2012-08-23 DIAGNOSIS — E119 Type 2 diabetes mellitus without complications: Secondary | ICD-10-CM | POA: Diagnosis present

## 2012-08-23 DIAGNOSIS — M797 Fibromyalgia: Secondary | ICD-10-CM | POA: Diagnosis present

## 2012-08-23 DIAGNOSIS — I4949 Other premature depolarization: Secondary | ICD-10-CM | POA: Diagnosis not present

## 2012-08-23 DIAGNOSIS — Z801 Family history of malignant neoplasm of trachea, bronchus and lung: Secondary | ICD-10-CM

## 2012-08-23 DIAGNOSIS — M549 Dorsalgia, unspecified: Secondary | ICD-10-CM | POA: Diagnosis present

## 2012-08-23 DIAGNOSIS — I255 Ischemic cardiomyopathy: Secondary | ICD-10-CM | POA: Diagnosis present

## 2012-08-23 DIAGNOSIS — F329 Major depressive disorder, single episode, unspecified: Secondary | ICD-10-CM | POA: Diagnosis present

## 2012-08-23 DIAGNOSIS — F32A Depression, unspecified: Secondary | ICD-10-CM | POA: Diagnosis present

## 2012-08-23 DIAGNOSIS — R079 Chest pain, unspecified: Secondary | ICD-10-CM

## 2012-08-23 DIAGNOSIS — I4729 Other ventricular tachycardia: Secondary | ICD-10-CM | POA: Diagnosis not present

## 2012-08-23 DIAGNOSIS — D62 Acute posthemorrhagic anemia: Secondary | ICD-10-CM | POA: Diagnosis not present

## 2012-08-23 DIAGNOSIS — K5900 Constipation, unspecified: Secondary | ICD-10-CM | POA: Diagnosis not present

## 2012-08-23 DIAGNOSIS — I491 Atrial premature depolarization: Secondary | ICD-10-CM | POA: Diagnosis present

## 2012-08-23 DIAGNOSIS — Z79899 Other long term (current) drug therapy: Secondary | ICD-10-CM

## 2012-08-23 DIAGNOSIS — I214 Non-ST elevation (NSTEMI) myocardial infarction: Principal | ICD-10-CM | POA: Diagnosis present

## 2012-08-23 DIAGNOSIS — Y832 Surgical operation with anastomosis, bypass or graft as the cause of abnormal reaction of the patient, or of later complication, without mention of misadventure at the time of the procedure: Secondary | ICD-10-CM | POA: Diagnosis not present

## 2012-08-23 DIAGNOSIS — I2584 Coronary atherosclerosis due to calcified coronary lesion: Secondary | ICD-10-CM | POA: Diagnosis present

## 2012-08-23 DIAGNOSIS — Z951 Presence of aortocoronary bypass graft: Secondary | ICD-10-CM

## 2012-08-23 DIAGNOSIS — Z7982 Long term (current) use of aspirin: Secondary | ICD-10-CM

## 2012-08-23 DIAGNOSIS — K560 Paralytic ileus: Secondary | ICD-10-CM | POA: Diagnosis not present

## 2012-08-23 HISTORY — DX: Essential (primary) hypertension: I10

## 2012-08-23 HISTORY — DX: Unspecified osteoarthritis, unspecified site: M19.90

## 2012-08-23 HISTORY — DX: Other chronic pain: G89.29

## 2012-08-23 HISTORY — DX: Personal history of nicotine dependence: Z87.891

## 2012-08-23 HISTORY — DX: Dorsalgia, unspecified: M54.9

## 2012-08-23 HISTORY — DX: Fibromyalgia: M79.7

## 2012-08-23 HISTORY — PX: LEFT HEART CATHETERIZATION WITH CORONARY ANGIOGRAM: SHX5451

## 2012-08-23 LAB — CBC
HCT: 42.4 % (ref 39.0–52.0)
HCT: 39.6 % (ref 39.0–52.0)
Hemoglobin: 14.5 g/dL (ref 13.0–17.0)
Hemoglobin: 13.3 g/dL (ref 13.0–17.0)
MCH: 30.1 pg (ref 26.0–34.0)
MCH: 30.3 pg (ref 26.0–34.0)
MCHC: 33.6 g/dL (ref 30.0–36.0)
MCV: 88 fL (ref 78.0–100.0)
MCV: 90.2 fL (ref 78.0–100.0)
Platelets: 200 10*3/uL (ref 150–400)
RBC: 4.82 MIL/uL (ref 4.22–5.81)
RBC: 4.39 MIL/uL (ref 4.22–5.81)
RDW: 13.5 % (ref 11.5–15.5)
WBC: 9.3 10*3/uL (ref 4.0–10.5)

## 2012-08-23 LAB — GLUCOSE, CAPILLARY
Glucose-Capillary: 244 mg/dL — ABNORMAL HIGH (ref 70–99)
Glucose-Capillary: 147 mg/dL — ABNORMAL HIGH (ref 70–99)
Glucose-Capillary: 122 mg/dL — ABNORMAL HIGH (ref 70–99)
Glucose-Capillary: 161 mg/dL — ABNORMAL HIGH (ref 70–99)

## 2012-08-23 LAB — POCT I-STAT TROPONIN I: Troponin i, poc: 0.01 ng/mL (ref 0.00–0.08)

## 2012-08-23 LAB — COMPREHENSIVE METABOLIC PANEL
ALT: 58 U/L — ABNORMAL HIGH (ref 0–53)
BUN: 26 mg/dL — ABNORMAL HIGH (ref 6–23)
CO2: 24 mEq/L (ref 19–32)
Calcium: 9.7 mg/dL (ref 8.4–10.5)
Creatinine, Ser: 1.06 mg/dL (ref 0.50–1.35)
GFR calc Af Amer: 81 mL/min — ABNORMAL LOW (ref >90)
GFR calc non Af Amer: 70 mL/min — ABNORMAL LOW (ref >90)
Glucose, Bld: 259 mg/dL — ABNORMAL HIGH (ref 70–99)

## 2012-08-23 LAB — TROPONIN I: Troponin I: <0.3 ng/mL (ref <0.30)

## 2012-08-23 LAB — APTT: aPTT: 28 seconds (ref 24–37)

## 2012-08-23 LAB — PROTIME-INR: INR: 0.94 (ref 0.00–1.49)

## 2012-08-23 SURGERY — LEFT HEART CATHETERIZATION WITH CORONARY ANGIOGRAM
Anesthesia: LOCAL

## 2012-08-23 MED ORDER — ASPIRIN EC 81 MG PO TBEC
81.0000 mg | DELAYED_RELEASE_TABLET | Freq: Every day | ORAL | Status: DC
Start: 2012-08-24 — End: 2012-08-25
  Administered 2012-08-24: 81 mg via ORAL
  Filled 2012-08-23 (×2): qty 1

## 2012-08-23 MED ORDER — SODIUM CHLORIDE 0.9 % IJ SOLN
3.0000 mL | Freq: Two times a day (BID) | INTRAMUSCULAR | Status: DC
  Administered 2012-08-24: 3 mL via INTRAVENOUS

## 2012-08-23 MED ORDER — PANTOPRAZOLE SODIUM 40 MG PO TBEC
40.0000 mg | DELAYED_RELEASE_TABLET | Freq: Every day | ORAL | Status: DC
  Administered 2012-08-23 – 2012-08-24 (×2): 40 mg via ORAL
  Filled 2012-08-23 (×2): qty 1

## 2012-08-23 MED ORDER — HEPARIN (PORCINE) IN NACL 2-0.9 UNIT/ML-% IJ SOLN
INTRAMUSCULAR | Status: AC
  Filled 2012-08-23: qty 1000

## 2012-08-23 MED ORDER — ZOLPIDEM TARTRATE 5 MG PO TABS
5.0000 mg | ORAL_TABLET | Freq: Every evening | ORAL | Status: DC | PRN
  Administered 2012-08-23 – 2012-08-24 (×2): 5 mg via ORAL
  Filled 2012-08-23 (×2): qty 1

## 2012-08-23 MED ORDER — ONDANSETRON HCL 4 MG/2ML IJ SOLN
4.0000 mg | Freq: Once | INTRAMUSCULAR | Status: DC
  Filled 2012-08-23: qty 2

## 2012-08-23 MED ORDER — NITROGLYCERIN 0.4 MG SL SUBL: 0.4000 mg | SUBLINGUAL_TABLET | SUBLINGUAL | Status: DC | PRN

## 2012-08-23 MED ORDER — VILAZODONE HCL 40 MG PO TABS
40.0000 mg | ORAL_TABLET | Freq: Every day | ORAL | Status: DC
  Administered 2012-08-23 – 2012-08-24 (×2): 40 mg via ORAL
  Filled 2012-08-23 (×3): qty 1

## 2012-08-23 MED ORDER — VERAPAMIL HCL 2.5 MG/ML IV SOLN
INTRAVENOUS | Status: AC
  Filled 2012-08-23: qty 2

## 2012-08-23 MED ORDER — SODIUM CHLORIDE 0.9 % IV SOLN: 1000.0000 mL | INTRAVENOUS | Status: DC

## 2012-08-23 MED ORDER — TRAZODONE HCL 100 MG PO TABS
100.0000 mg | ORAL_TABLET | Freq: Every evening | ORAL | Status: DC | PRN
  Filled 2012-08-23: qty 1

## 2012-08-23 MED ORDER — ONDANSETRON HCL 4 MG/2ML IJ SOLN: 4.0000 mg | Freq: Four times a day (QID) | INTRAMUSCULAR | Status: DC | PRN

## 2012-08-23 MED ORDER — ACETAMINOPHEN 325 MG PO TABS: 650.0000 mg | ORAL_TABLET | ORAL | Status: DC | PRN

## 2012-08-23 MED ORDER — NITROGLYCERIN IN D5W 200-5 MCG/ML-% IV SOLN: 5.0000 ug/min | INTRAVENOUS | Status: DC

## 2012-08-23 MED ORDER — SODIUM CHLORIDE 0.9 % IV SOLN
250.0000 mL | INTRAVENOUS | Status: DC | PRN
  Administered 2012-08-23: 250 mL via INTRAVENOUS

## 2012-08-23 MED ORDER — OXYCODONE HCL 5 MG PO TABS: 5.0000 mg | ORAL_TABLET | ORAL | Status: DC | PRN

## 2012-08-23 MED ORDER — HEPARIN (PORCINE) IN NACL 100-0.45 UNIT/ML-% IJ SOLN
1950.0000 [IU]/h | INTRAMUSCULAR | Status: DC
  Administered 2012-08-23: 1300 [IU]/h via INTRAVENOUS
  Filled 2012-08-23 (×4): qty 250

## 2012-08-23 MED ORDER — MORPHINE SULFATE 2 MG/ML IJ SOLN
2.0000 mg | Freq: Once | INTRAMUSCULAR | Status: AC
  Administered 2012-08-23: 2 mg via INTRAVENOUS
  Filled 2012-08-23: qty 1

## 2012-08-23 MED ORDER — SODIUM CHLORIDE 0.9 % IV SOLN: INTRAVENOUS | Status: AC

## 2012-08-23 MED ORDER — HEPARIN BOLUS VIA INFUSION
4000.0000 [IU] | Freq: Once | INTRAVENOUS | Status: AC
  Administered 2012-08-23: 4000 [IU] via INTRAVENOUS

## 2012-08-23 MED ORDER — ATROPINE SULFATE 1 MG/ML IJ SOLN
INTRAMUSCULAR | Status: AC
  Filled 2012-08-23: qty 1

## 2012-08-23 MED ORDER — OXYCODONE-ACETAMINOPHEN 10-325 MG PO TABS: 1.0000 | ORAL_TABLET | ORAL | Status: DC | PRN

## 2012-08-23 MED ORDER — LIDOCAINE HCL (PF) 1 % IJ SOLN
INTRAMUSCULAR | Status: AC
  Filled 2012-08-23: qty 30

## 2012-08-23 MED ORDER — ASPIRIN 81 MG PO CHEW: 324.0000 mg | CHEWABLE_TABLET | Freq: Once | ORAL | Status: DC

## 2012-08-23 MED ORDER — METOPROLOL TARTRATE 12.5 MG HALF TABLET
12.5000 mg | ORAL_TABLET | Freq: Two times a day (BID) | ORAL | Status: DC
  Administered 2012-08-24 (×2): 12.5 mg via ORAL
  Filled 2012-08-23 (×5): qty 1

## 2012-08-23 MED ORDER — SODIUM CHLORIDE 0.9 % IJ SOLN: 3.0000 mL | INTRAMUSCULAR | Status: DC | PRN

## 2012-08-23 MED ORDER — OXYCODONE-ACETAMINOPHEN 5-325 MG PO TABS
1.0000 | ORAL_TABLET | ORAL | Status: DC | PRN
  Administered 2012-08-23 – 2012-08-24 (×3): 2 via ORAL
  Filled 2012-08-23 (×4): qty 2

## 2012-08-23 MED ORDER — MORPHINE SULFATE 4 MG/ML IJ SOLN: 4.0000 mg | Freq: Once | INTRAMUSCULAR | Status: DC

## 2012-08-23 MED ORDER — BUPROPION HCL ER (XL) 300 MG PO TB24
300.0000 mg | ORAL_TABLET | Freq: Every day | ORAL | Status: DC
  Administered 2012-08-24: 300 mg via ORAL
  Filled 2012-08-23 (×3): qty 1

## 2012-08-23 MED ORDER — FENTANYL CITRATE 0.05 MG/ML IJ SOLN
INTRAMUSCULAR | Status: AC
  Filled 2012-08-23: qty 2

## 2012-08-23 MED ORDER — HEPARIN (PORCINE) IN NACL 100-0.45 UNIT/ML-% IJ SOLN
1000.0000 [IU]/h | INTRAMUSCULAR | Status: DC
  Administered 2012-08-23: 1000 [IU]/h via INTRAVENOUS
  Filled 2012-08-23: qty 250

## 2012-08-23 MED ORDER — VITAMIN D3 25 MCG (1000 UNIT) PO TABS
1000.0000 [IU] | ORAL_TABLET | Freq: Every day | ORAL | Status: DC
  Administered 2012-08-24: 1000 [IU] via ORAL
  Filled 2012-08-23 (×3): qty 1

## 2012-08-23 MED ORDER — INSULIN ASPART 100 UNIT/ML ~~LOC~~ SOLN
0.0000 [IU] | Freq: Three times a day (TID) | SUBCUTANEOUS | Status: DC
  Administered 2012-08-23 – 2012-08-24 (×2): 2 [IU] via SUBCUTANEOUS
  Administered 2012-08-24: 3 [IU] via SUBCUTANEOUS

## 2012-08-23 MED ORDER — DOPAMINE-DEXTROSE 3.2-5 MG/ML-% IV SOLN
INTRAVENOUS | Status: AC
  Filled 2012-08-23: qty 250

## 2012-08-23 MED ORDER — OXYCODONE-ACETAMINOPHEN 5-325 MG PO TABS: 1.0000 | ORAL_TABLET | ORAL | Status: DC | PRN

## 2012-08-23 MED ORDER — NITROGLYCERIN IN D5W 200-5 MCG/ML-% IV SOLN
2.0000 ug/min | Freq: Once | INTRAVENOUS | Status: AC
  Administered 2012-08-23: 10 ug/min via INTRAVENOUS
  Filled 2012-08-23: qty 250

## 2012-08-23 MED ORDER — SODIUM CHLORIDE 0.9 % IV SOLN
INTRAVENOUS | Status: DC
  Administered 2012-08-24: 16:00:00 via INTRAVENOUS

## 2012-08-23 MED ORDER — ATORVASTATIN CALCIUM 80 MG PO TABS
80.0000 mg | ORAL_TABLET | Freq: Every day | ORAL | Status: DC
  Administered 2012-08-23 – 2012-08-24 (×2): 80 mg via ORAL
  Filled 2012-08-23 (×3): qty 1

## 2012-08-23 MED ORDER — SODIUM CHLORIDE 0.9 % IV SOLN: INTRAVENOUS | Status: DC

## 2012-08-23 MED ORDER — ATROPINE SULFATE 0.4 MG/ML IJ SOLN
0.4000 mg | Freq: Once | INTRAMUSCULAR | Status: AC
  Administered 2012-08-23: 0.4 mg via INTRAVENOUS
  Filled 2012-08-23: qty 1

## 2012-08-23 MED ORDER — MIDAZOLAM HCL 2 MG/2ML IJ SOLN
INTRAMUSCULAR | Status: AC
  Filled 2012-08-23: qty 2

## 2012-08-23 MED ORDER — DOPAMINE-DEXTROSE 3.2-5 MG/ML-% IV SOLN
2.0000 ug/kg/min | INTRAVENOUS | Status: DC
Start: 2012-08-23 — End: 2012-08-25
  Administered 2012-08-23: 2 ug/kg/min via INTRAVENOUS

## 2012-08-23 NOTE — ED Provider Notes (Signed)
History     CSN: [id number]  Arrival date & time 08/23/12  1151   First MD Initiated Contact with Patient 08/23/12 1152      Chief Complaint  Patient presents with  . Chest Pain    (Consider location/radiation/quality/duration/timing/severity/associated sxs/prior treatment) HPI Comments: Patient is a 72 year old man who had started to clean out a. He felt badly and went inside, and had profuse sweating. Then he developed substernal chest pain that radiated into his jaw and left arm. EMS was clamped and they gave him aspirin 324 mg and 2 sublingual nitros. He had some relief of pain, but still feels substernal chest pain. There's no prior history of heart disease. He does have diabetes and hypertension, followed by Dr. [name].  Patient is a 72 y.o. male presenting with chest pain. The history is provided by the patient. No language interpreter was used.  Chest Pain Pain location:  Substernal area Pain quality comment:  Severe Pain radiates to:  L jaw, R jaw, L shoulder and L arm Pain radiates to the back: no   Pain severity:  Severe Timing:  Intermittent Progression:  Worsening Chronicity:  New Relieved by:  Nitroglycerin, aspirin and oxygen Worsened by:  Nothing tried Associated symptoms: diaphoresis, shortness of breath and weakness   Associated symptoms comment:  Sweating    Past Medical History  Diagnosis Date  . Diabetes mellitus without complication   . Hypertension   . Back pain, chronic     No past surgical history on file.  No family history on file.  History  Substance Use Topics  . Smoking status: Former Games developer  . Smokeless tobacco: Not on file  . Alcohol Use: No      Review of Systems  Constitutional: Positive for diaphoresis.  HENT: Negative.   Eyes: Negative.   Respiratory: Positive for shortness of breath.   Cardiovascular: Positive for chest pain.  Gastrointestinal: Negative.   Genitourinary: Negative.   Musculoskeletal: Negative.    Skin: Negative.   Neurological: Positive for weakness.  Psychiatric/Behavioral: Negative.     Allergies  Review of patient's allergies indicates not on file.  Home Medications  No current outpatient prescriptions on file.  BP 90/58  Pulse 66  Temp(Src) 97.6 F (36.4 C) (Oral)  Resp 20  SpO2 100%  Physical Exam  Nursing note and vitals reviewed. Constitutional: He is oriented to person, place, and time. He appears well-developed and well-nourished.  In acute distress with chest pain.  HENT:  Head: Normocephalic and atraumatic.  Right Ear: External ear normal.  Left Ear: External ear normal.  Mouth/Throat: Oropharynx is clear and moist.  Eyes: Conjunctivae and EOM are normal. Pupils are equal, round, and reactive to light.  Neck: Normal range of motion. Neck supple.  Cardiovascular: Normal rate, regular rhythm and normal heart sounds.   Pulmonary/Chest: Effort normal and breath sounds normal.  Abdominal: Soft. Bowel sounds are normal.  Musculoskeletal: Normal range of motion. He exhibits no edema and no tenderness.  Neurological: He is alert and oriented to person, place, and time. He has normal reflexes.  No sensory or motor deficit.  Skin: Skin is warm and dry.  Psychiatric: He has a normal mood and affect. His behavior is normal.    ED Course  CRITICAL CARE Performed by: Osvaldo Human Authorized by: Osvaldo Human Total critical care time: 30 minutes Critical care was necessary to treat or prevent imminent or life-threatening deterioration of the following conditions: 72 yo man with severe chest  pain, Hx diabetes and hypertension, treated with IV morpine and Zofran, IV nitroglycerin and IV heparin, call to Cardiology to take pt to Cath Lab. Critical care was time spent personally by me on the following activities: discussions with consultants, evaluation of patient's response to treatment, examination of patient, obtaining history from patient or surrogate,  ordering and performing treatments and interventions, ordering and review of laboratory studies, ordering and review of radiographic studies, pulse oximetry and re-evaluation of patient's condition.   (including critical care time)  Labs Reviewed  CBC WITH DIFFERENTIAL  BASIC METABOLIC PANEL    Date: 08/23/2012  Rate:63  Rhythm: normal sinus rhythm  QRS Axis: left  Intervals: normal  ST/T Wave abnormalities: normal  Conduction Disutrbances:left anterior fascicular block  Narrative Interpretation: Abnormal EKG  Old EKG Reviewed: unchanged  12:35 PM Pt seen --> physical exam performed.  EKG was non-acute, but pt's symptoms suggest NSTEMI.  IV NTG, IV heparin, IV morphine and Zofran ordered.  Lab workup ordered.  PCXR shows no widening of mediastinum, doubt aortic dissection.  12:50 PM Call to Daun Peacock of [facility].  They will be down to see pt.  1:17 PM Pain getting better, but still in pain.  Repeat Morphine 4 mg IV ordered.  Ward Givens, PA-C for [facility] is here with pt, arranging for trip to cath lab.  TNI 0.01.   1. Chest pain   2. Unstable angina          Carleene Cooper III, MD 08/23/12 1321

## 2012-08-23 NOTE — Progress Notes (Signed)
Utilization Review Completed.Timothy Cervantes T3/26/2014  

## 2012-08-23 NOTE — H&P (Signed)
Patient ID: Timothy Cervantes MRN: [id number], DOB/AGE: 1940-08-14   Admit date: 08/23/2012  Primary Physician: [name], MD Primary Cardiologist: New - seen by C. Clifton James, MD  Pt. Profile:  72 y/o male w/o prior cardiac hx who presents with severe chest pain.  Problem List  Past Medical History  Diagnosis Date  . Diabetes mellitus without complication     a. Dx in 2010  . Hypertension     a. Dx in 2010  . Back pain, chronic   . DJD (degenerative joint disease)   . History of tobacco abuse     a. 57 year hx, quit 03/2012.  [name] Depression   . Chronic pain   . Fibromyalgia     Past Surgical History  Procedure Laterality Date  . Right ankle surgery    . Cholecystectomy    . Bilateral rotator cuff repair    . Transurethral resection of prostate    . Vocal cord tumor resection    . Skin cancer of left face - resected      Allergies  No Known Allergies  HPI  72 y/o male with the above problem list.  He has no prior cardiac hx.  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 He took off his jacket and continue to work but remained very diaphoretic and subsequently developed jaw discomfort which moved into his chest and subsequently his left arm. He went inside and took 2 baby aspirin and then sat down however discomfort persisted. He then called 911. Upon EMS arrival, he was given additional aspirin as well as sublingual nitroglycerin without significant improvement in symptoms. He was taken to the [facility] and placed on nitroglycerin infusion which is been titrated to 30 mcg per minute period he initially got some relief however discomfort now is rated as a 6/10. ECG throughout his discomfort has been without acute ST or T changes. His initial troponin is normal. Chest x-ray is without acute findings.  Home Medications  Prior to Admission medications   Medication Sig Start Date End Date  Taking? Authorizing Provider  amLODipine-valsartan (EXFORGE) 5-320 MG per tablet Take 1 tablet by mouth daily.   Yes Historical Provider, MD  buprenorphine (BUTRANS - DOSED MCG/HR) 10 MCG/HR PTWK Place 10 mcg onto the skin once a week.   Yes Historical Provider, MD  buPROPion (WELLBUTRIN XL) 300 MG 24 hr tablet Take 300 mg by mouth daily.   Yes Historical Provider, MD  cholecalciferol (VITAMIN D) 1000 UNITS tablet Take 1,000 Units by mouth daily.   Yes Historical Provider, MD  metFORMIN (GLUCOPHAGE) 500 MG tablet Take 500 mg by mouth daily.   Yes Historical Provider, MD  oxyCODONE-acetaminophen (PERCOCET) 10-325 MG per tablet Take 1 tablet by mouth every 4 (four) hours as needed for pain.   Yes Historical Provider, MD  pantoprazole (PROTONIX) 40 MG tablet Take 40 mg by mouth daily.   Yes Historical Provider, MD  TRAZODONE HCL PO Take by mouth. Patient takes 1 table at night for sleep   Yes Historical Provider, MD  Vilazodone HCl (VIIBRYD) 40 MG TABS Take 40 mg by mouth daily.   Yes Historical Provider, MD   Family History  Family History  Problem Relation Age of Onset  . Heart attack Father     died @ 53  . Lung cancer Mother     died @ 51  .  Other Sister     A&[employer] in her 45's  . Bladder Cancer Brother     alive   Social History  History   Social History  . Marital Status: Married    Spouse Name: N/A    Number of Children: N/A  . Years of Education: N/A   Occupational History  . Not on file.   Social History Main Topics  . Smoking status: Former Smoker -- 1.00 packs/day for 57 years  . Smokeless tobacco: Not on file     Comment: smoked from the age of 46.  Quit 03/2012  . Alcohol Use: No  . Drug Use: No  . Sexually Active: Not on file   Other Topics Concern  . Not on file   Social History Narrative   Lives in [place] with wife.  Retired.    Review of Systems General:  No chills, fever, night sweats or weight changes.  Cardiovascular:  +++ chest pain, dyspnea, jaw pain,  diaphoresis.  No edema, orthopnea, palpitations, paroxysmal nocturnal dyspnea. Dermatological: No rash, lesions/masses Respiratory: No cough, +++ dyspnea Urologic: No hematuria, dysuria Abdominal:   No nausea, vomiting, diarrhea, bright red blood per rectum, melena, or hematemesis Neurologic:  No visual changes, wkns, changes in mental status. All other systems reviewed and are otherwise negative except as noted above.  Physical Exam  Blood pressure 106/65, pulse 74, temperature 97.6 F (36.4 C), temperature source Oral, resp. rate 25, height 6\' 1"  (1.854 m), weight 250 lb (113.399 kg), SpO2 100.00%.  General: Pleasant, c/o c/p. Psych: Normal affect. Neuro: Alert and oriented X 3. Moves all extremities spontaneously. HEENT: Normal  Neck: Supple without bruits or JVD. Lungs:  Resp regular and unlabored, CTA. Heart: RRR no s3, s4, or murmurs. Abdomen: Soft, non-tender, non-distended, BS + x 4.  Extremities: No clubbing, cyanosis or edema. DP/PT/Radials 2+ and equal bilaterally.  Labs  Troponin i, poc: 0.01  Radiology/Studies  Dg Chest Portable 1 View  08/23/2012  *RADIOLOGY REPORT*  Clinical Data: Chest pain.  Labored breathing.  PORTABLE CHEST - 1 VIEW  Comparison: 08/31/2007  Findings: Heart is mildly enlarged.  No confluent airspace opacities or effusions.  No acute bony abnormality.  IMPRESSION: Borderline heart size.   Original Report Authenticated By: Charlett Nose, M.D.    ECG  rsr 63, lad, no acute st/t changes.  ASSESSMENT AND PLAN  1. Unstable angina: Patient presents with a 2-1/2 hour history of ongoing chest discomfort with associated symptoms and radiation to the left arm. Although his ECG is nonischemic in initial troponin is normal, with his history of hypertension, diabetes, and family history of coronary artery disease, as well as significant symptoms we are taking him to the cardiac catheterization laboratory urgently for diagnostic catheterization. We'll plan to  add aspirin, high-dose statin, beta blocker, plus/minus P2Y12 inhibitor pending anatomy.  2. Hypertension: Blood pressure currently soft in the setting of IV nitroglycerin ongoing symptoms. Follow.  3. Lipid status: Currently unknown. At high-dose statin.  4. Diabetes mellitus: He uses an oral agent at home. Will add sliding scale insulin.  5. Degenerative joint disease with chronic back pain and fibromyalgia: Continue home pain medications.  Signed, Nicolasa Ducking, NP 08/23/2012, 1:43 PM  I have personally seen and examined this patient with Ward Givens, NP. I agree with the assessment and plan as outlined above. Urgent cath now for ongoing chest/jaw pain. EKG without ischemic changes.   Adaleah Forget 1:51 PM 08/23/2012

## 2012-08-23 NOTE — Progress Notes (Signed)
eLink Physician-Brief Progress Note Patient Name: Timothy Cervantes DOB: Jan 28, 1941 MRN: [id number]  Date of Service  08/23/2012   HPI/Events of Note   Bradycardia / hypotension  eICU Interventions   Nitroglycerin gtt off NS 500 x 1 Atropine 0.4 mg x 1  Dopamine gtt Cardiology paged by RN    Intervention Category Major Interventions: Arrhythmia - evaluation and management  Connie Lasater 08/23/2012, 6:04 PM

## 2012-08-23 NOTE — Interval H&P Note (Signed)
History and Physical Interval Note:  08/23/2012 1:52 PM  Timothy Cervantes  has presented today for urgent cardiac cath with the diagnosis of Chest pain  The various methods of treatment have been discussed with the patient and family. After consideration of risks, benefits and other options for treatment, the patient has consented to  Procedure(s): LEFT HEART CATHETERIZATION WITH CORONARY ANGIOGRAM (N/A) as a surgical intervention .  The patient's history has been reviewed, patient examined, no change in status, stable for surgery.  I have reviewed the patient's chart and labs.  Questions were answered to the patient's satisfaction.     MCALHANY,CHRISTOPHER

## 2012-08-23 NOTE — Progress Notes (Addendum)
Called to see patient due to episode of hypotension and HR down into the 50's. Patient developed diaphoresis and nausea just like had at home earlier today. Hed was noted to have HR down to 50 (running 70-80s earlier) and BP in the 60's. ELink rx'd atropine 0.4mg , NS bolus, stopped NTG gtt. BP came up to 95-110 systolic and patient currently without symptoms. He denies any chest pain. Tele tracings reviewed - no heart block or pauses, just sinus bradycardia. Pt examined by Dr. Patty Sermons without acute findings. Will continue to monitor, run fluids at 114ml/hr for 6 hours then drop back down to 86ml/hour (this will need to be saline locked in the AM if BP/HR remain stable). Continue IV heparin, ASA, BB (as BP allows), statin.   Janey Petron PA-C  Exam presently unremarkable, no chest pain or diaphoresis now. Agree with plan as outlined above.

## 2012-08-23 NOTE — ED Notes (Signed)
Per report from [facility] pt was at home vacuuming a boat.  He states that he began to have Central chest pain radiating to bilateral jaws and L arm.  He was noted to be diaphoretic on [facility] arrival.  EMS completed an EKG which was negative for STEMI.  Aspirin 324 mg PO, and 2 SL NTG and a 16 ga IV L forearm.  On arrival pt denies CP 0/10 (initially 8/10 prior to meds) but states that his L arm and jaws still hurt.

## 2012-08-23 NOTE — Progress Notes (Addendum)
ANTICOAGULATION CONSULT NOTE - Initial Consult  Pharmacy Consult for Heparin Indication: 3 vessel CAD, pending TCTS assessment  No Known Allergies  Patient Measurements: Height: 6\' 1"  (185.4 cm) Weight: 255 lb 11.7 oz (116 kg) IBW/kg (Calculated) : 79.9 Heparin Dosing Weight: 105 Kg  Vital Signs: Temp: 98.2 F (36.8 C) (03/26 1612) Temp src: Oral (03/26 1612) BP: 129/74 mmHg (03/26 1612) Pulse Rate: 87 (03/26 1612)  Labs:  Recent Labs  08/23/12 1242  HGB 14.5  HCT 42.4  PLT 253  APTT 28  LABPROT 12.5  INR 0.94  CREATININE 1.06    Estimated Creatinine Clearance: 87.7 ml/min (by C-G formula based on Cr of 1.06).   Medical History: Past Medical History  Diagnosis Date  . Diabetes mellitus without complication     a. Dx in 2010  . Hypertension     a. Dx in 2010  . Back pain, chronic   . DJD (degenerative joint disease)   . History of tobacco abuse     a. 57 year hx, quit 03/2012.  [name] Chronic pain   . Fibromyalgia   . Anginal pain   . Chronic back pain     Medications:  Prescriptions prior to admission  Medication Sig Dispense Refill  . amLODipine-valsartan (EXFORGE) 5-320 MG per tablet Take 1 tablet by mouth daily.      . buprenorphine (BUTRANS - DOSED MCG/HR) 10 MCG/HR PTWK Place 10 mcg onto the skin once a week.      [name] buPROPion (WELLBUTRIN XL) 300 MG 24 hr tablet Take 300 mg by mouth daily.      . cholecalciferol (VITAMIN D) 1000 UNITS tablet Take 1,000 Units by mouth daily.      . metFORMIN (GLUCOPHAGE) 500 MG tablet Take 500 mg by mouth daily.      [name] oxyCODONE-acetaminophen (PERCOCET) 10-325 MG per tablet Take 1 tablet by mouth every 4 (four) hours as needed for pain.      . pantoprazole (PROTONIX) 40 MG tablet Take 40 mg by mouth daily.      . TRAZODONE HCL PO Take by mouth. Patient takes 1 table at night for sleep      . Vilazodone HCl (VIIBRYD) 40 MG TABS Take 40 mg by mouth daily.        Assessment: Timothy Cervantes was admitted with chest pain,  and is now s/p radial cath, which revealed severe triple vessel CAD. Noted plans for CABG assessment, and orders to start heparin 8h after TR band off. Spoke with RN who informed me the band was off ~1700, and there was no bleeding at the site. Scr is nml, H/H and plts are nml at baseline. Noted patient received Heparin 4000 units followed by a 1000 unit/hr infusion pre-cath, no heparin levels were done d/t patient taken to cath lab. No prior use of anticoagulants noted, INR 0.94 at baseline.   Goal of Therapy:  Heparin level 0.3-0.7 units/ml Monitor platelets by anticoagulation protocol: Yes   Plan:  Start heparin infusion at 1300 units/hr at 0100 3/27 Check anti-Xa level in 6 hours and daily while on heparin Continue to monitor H&H and platelets  Thanks, Rohen Kimes K. Allena Katz, PharmD, BCPS.  Clinical Pharmacist Pager [phone]. 08/23/2012 4:38 PM

## 2012-08-23 NOTE — Progress Notes (Signed)
Pt with sudden onset diaphoresis, HR down to 50['s bp down, elink MD notified, denies any chest pain. Question vagal response

## 2012-08-23 NOTE — Consult Note (Signed)
[address]          [place] [address]       [phone]       Timothy Cervantes [facility] Medical Record #[id number] Date of Birth: 1941/02/01  Referring: No ref. provider found Primary Care: [name], MD  Chief Complaint:    Chief Complaint  Patient presents with  . Chest Pain    History of Present Illness:     72 yo WM with history of DM, HTN, long term tobacco abuse who presented to the ED with c/o chest and jaw pain. Patient noted the onset of symptoms while exerting himself this morning cleaning out a boat. He was especially concerned because of the pain in his arm and severe diaphoresis. He said a few episodes of similar discomfort but not as severe over the past several months. EKG without ischemic changes. First troponin negative despite ongoing severe chest pain for 4 hours. Urgent cardiac cath was performed today.    the patient is now in intensive care unit resting comfortably without chest pain.    Current Activity/ Functional Status: Patient is independent with mobility/ambulation, transfers, ADL's, IADL's.   Zubrod Score: At the time of surgery this patient's most appropriate activity status/level should be described as: [x]  Normal activity, no symptoms []  Symptoms, fully ambulatory []  Symptoms, in bed less than or equal to 50% of the time []  Symptoms, in bed greater than 50% of the time but less than 100% []  Bedridden []  Moribund  Past Medical History  Diagnosis Date  . Diabetes mellitus without complication     a. Dx in 2010  . Hypertension     a. Dx in 2010  . Back pain, chronic   . DJD (degenerative joint disease)   . History of tobacco abuse     a. 57 year hx, quit 03/2012.  [name] Chronic pain   . Fibromyalgia   . Anginal pain   . Chronic back pain     Past Surgical History  Procedure Laterality Date  . Right ankle surgery    . Cholecystectomy    . Bilateral rotator cuff repair    . Transurethral  resection of prostate    . Vocal cord tumor resection    . Skin cancer of left face - resected Left     parotid gland resection-non cancerous    History  Smoking status  . Former Smoker -- 1.00 packs/day for 57 years  . Types: Cigarettes  Smokeless tobacco  . Current User  . Types: Chew    Comment: smoked from the age of 89.  Quit 03/2012    History  Alcohol Use No    History   Social History  . Marital Status: Married    Spouse Name: N/A    Number of Children: N/A  . Years of Education: N/A   Occupational History  . Retiresd, Systems developer   Social History Main Topics  . Smoking status: Former Smoker -- 1.00 packs/day for 57 years    Types: Cigarettes  . Smokeless tobacco: Current User    Types: Chew     Comment: smoked from the age of 54.  Quit 03/2012  . Alcohol Use: No  . Drug Use: No  . Sexually Active: Not on file          Social History Narrative   Lives in [place] with wife.  Retired.  No Known Allergies  Current Facility-Administered Medications  Medication Dose Route Frequency Provider Last Rate Last Dose  . 0.9 %  sodium chloride infusion  250 mL Intravenous PRN Ok Anis, NP      . 0.9 %  sodium chloride infusion   Intravenous Continuous Kathleene Hazel, MD 75 mL/hr at 08/23/12 1510 300 mL at 08/23/12 1510  . acetaminophen (TYLENOL) tablet 650 mg  650 mg Oral Q4H PRN Ok Anis, NP      . acetaminophen (TYLENOL) tablet 650 mg  650 mg Oral Q4H PRN Kathleene Hazel, MD      . Melene Muller ON 08/24/2012] aspirin EC tablet 81 mg  81 mg Oral Daily Ok Anis, NP      . atorvastatin (LIPITOR) tablet 80 mg  80 mg Oral q1800 Ok Anis, NP      . buPROPion (WELLBUTRIN XL) 24 hr tablet 300 mg  300 mg Oral Daily Ok Anis, NP      . cholecalciferol (VITAMIN D) tablet 1,000 Units  1,000 Units Oral Daily Ok Anis, NP      . Melene Muller ON 08/24/2012] heparin ADULT infusion 100 units/mL (25000  units/250 mL)  1,300 Units/hr Intravenous Continuous Christella Hartigan, RPH 13 mL/hr at 08/23/12 1717 1,300 Units/hr at 08/23/12 1717  . insulin aspart (novoLOG) injection 0-15 Units  0-15 Units Subcutaneous TID WC Ok Anis, NP   2 Units at 08/23/12 1716  . metoprolol tartrate (LOPRESSOR) tablet 12.5 mg  12.5 mg Oral BID Ok Anis, NP      . nitroGLYCERIN (NITROSTAT) SL tablet 0.4 mg  0.4 mg Sublingual Q5 Min x 3 PRN Ok Anis, NP      . nitroGLYCERIN 0.2 mg/mL in dextrose 5 % infusion  5 mcg/min Intravenous Titrated Carleene Cooper III, MD      . ondansetron [facility]) injection 4 mg  4 mg Intravenous Q6H PRN Ok Anis, NP      . ondansetron (ZOFRAN) injection 4 mg  4 mg Intravenous Q6H PRN Kathleene Hazel, MD      . oxyCODONE-acetaminophen (PERCOCET/ROXICET) 5-325 MG per tablet 1 tablet  1 tablet Oral Q4H PRN Ok Anis, NP       And  . oxyCODONE (Oxy IR/ROXICODONE) immediate release tablet 5 mg  5 mg Oral Q4H PRN Ok Anis, NP      . oxyCODONE-acetaminophen (PERCOCET/ROXICET) 5-325 MG per tablet 1-2 tablet  1-2 tablet Oral Q4H PRN Kathleene Hazel, MD      . pantoprazole (PROTONIX) EC tablet 40 mg  40 mg Oral Daily Ok Anis, NP   40 mg at 08/23/12 1716  . sodium chloride 0.9 % injection 3 mL  3 mL Intravenous Q12H Ok Anis, NP      . sodium chloride 0.9 % injection 3 mL  3 mL Intravenous PRN Ok Anis, NP      . traZODone (DESYREL) tablet 100 mg  100 mg Oral QHS PRN Ok Anis, NP      . Vilazodone HCl (VIIBRYD) TABS 40 mg  40 mg Oral Daily Ok Anis, NP      . zolpidem (AMBIEN) tablet 5 mg  5 mg Oral QHS PRN Kathleene Hazel, MD        Prescriptions prior to admission  Medication Sig Dispense Refill  . amLODipine-valsartan (EXFORGE) 5-320 MG per tablet Take 1 tablet by mouth daily.      [name]  buprenorphine (BUTRANS - DOSED MCG/HR) 10 MCG/HR PTWK Place 10 mcg onto the skin  once a week.      [name] buPROPion (WELLBUTRIN XL) 300 MG 24 hr tablet Take 300 mg by mouth daily.      . cholecalciferol (VITAMIN D) 1000 UNITS tablet Take 1,000 Units by mouth daily.      . metFORMIN (GLUCOPHAGE) 500 MG tablet Take 500 mg by mouth daily.      [name] oxyCODONE-acetaminophen (PERCOCET) 10-325 MG per tablet Take 1 tablet by mouth every 4 (four) hours as needed for pain.      . pantoprazole (PROTONIX) 40 MG tablet Take 40 mg by mouth daily.      . TRAZODONE HCL PO Take by mouth. Patient takes 1 table at night for sleep      . Vilazodone HCl (VIIBRYD) 40 MG TABS Take 40 mg by mouth daily.        Family History  Problem Relation Age of Onset  . Heart attack Father     died @ 89  . Lung cancer Mother     died @ 56  . Other Sister     A&W in her 33's  . Bladder Cancer Brother     alive     Review of Systems:     Cardiac Review of Systems: Y or N  Chest Pain [  y  ]  Resting SOB [ n  ] Exertional SOB  [n  ]  Orthopnea [ n ]   Pedal Edema [n   ]    Palpitations [ n ] Syncope  [ n ]   Presyncope [ n  ]  General Review of Systems: [Y] = yes [  ]=no Constitional: recent weight change [ n ]; anorexia [  ]; fatigue [  ]; nausea [  ]; night sweats [  ]; fever [  ]; or chills [  ] diaphoresis Mahler.Beck ]                                                             Dental: poor dentition[ n ]; Last Dentist visit:   Eye : blurred vision [  ]; diplopia [   ]; vision changes [  ];  Amaurosis fugax[  ]; Resp: cough [  ];  wheezing[  ];  hemoptysis[ n ]; shortness of breath[ n ]; paroxysmal nocturnal dyspnea[  ]; dyspnea on exertion[  ]; or orthopnea[  ];  GI:  gallstones[  ], vomiting[  ];  dysphagia[  ]; melena[  ];  hematochezia [  ]; heartburn[  ];   Hx of  Colonoscopy[ y ]; GU: kidney stones [  ]; hematuria[history of  ];   dysuria [  ];  nocturia[  ];  history of     obstruction [  ]; urinary frequency [yes with nocturia  ]             Skin: rash, swelling[  ];, hair loss[  ];  peripheral edema[   ];  or itching[  ]; Musculosketetal: myalgias[  ];  joint swelling[  ];  joint erythema[  ];  joint pain[  ];  back pain[  ];  Heme/Lymph: bruising[  ];  bleeding[  ];  anemia[  ];  Neuro: TIA[  ];  headaches[  ];  stroke[  ];  vertigo[  ];  seizures[  ];   paresthesias[  ];  difficulty walking[  n];  Psych:depression[  ]; anxiety[  ];  Endocrine: diabetes[ y ];  thyroid dysfunction[n  ];  Immunizations: Flu Cove.Etienne  ]; Pneumococcal[ n ];  Other:  Physical Exam: BP 112/67  Pulse 86  Temp(Src) 98.2 F (36.8 C) (Oral)  Resp 20  Ht 6\' 1"  (1.854 m)  Wt 255 lb 11.7 oz (116 kg)  BMI 33.75 kg/m2  SpO2 100%  General appearance: alert, cooperative, appears stated age and no distress Neurologic: intact Heart: regular rate and rhythm, S1, S2 normal, no murmur, click, rub or gallop and normal apical impulse Lungs: clear to auscultation bilaterally and normal percussion bilaterally Abdomen: soft, non-tender; bowel sounds normal; no masses,  no organomegaly Extremities: extremities normal, atraumatic, no cyanosis or edema and Homans sign is negative, no sign of DVT no carotid briuts, full dp, pt pulses bilaterial, rt radial cath site ok   Diagnostic Studies & Laboratory data:     Recent Radiology Findings:   Dg Chest Portable 1 View  08/23/2012  *RADIOLOGY REPORT*  Clinical Data: Chest pain.  Labored breathing.  PORTABLE CHEST - 1 VIEW  Comparison: 08/31/2007  Findings: Heart is mildly enlarged.  No confluent airspace opacities or effusions.  No acute bony abnormality.  IMPRESSION: Borderline heart size.   Original Report Authenticated By: Charlett Nose, M.D.       Recent Lab Findings: Lab Results  Component Value Date   WBC 9.3 08/23/2012   HGB 13.3 08/23/2012   HCT 39.6 08/23/2012   PLT 200 08/23/2012   GLUCOSE 259* 08/23/2012   ALT 58* 08/23/2012   AST 42* 08/23/2012   NA 138 08/23/2012   K 4.5 08/23/2012   CL 102 08/23/2012   CREATININE 1.06 08/23/2012   BUN 26* 08/23/2012   CO2 24 08/23/2012    INR 0.94 08/23/2012   Lab Results  Component Value Date   CKTOTAL 124 08/31/2007   CKMB 3.1 08/31/2007   TROPONINI  Value: 0.01        NO INDICATION OF MYOCARDIAL INJURY. 08/31/2007   CATH:  Hemodynamic Findings:  Central aortic pressure: 108/68  Left ventricular pressure: 110/3/8  Angiographic Findings:  Left main: No obstructive disease.  Left Anterior Descending Artery: Large caliber vessel that courses to the apex. The proximal vessel is severely calcified and has diffuse 70% stenosis. The mid vessel is calcified and has a focal 90% stenosis. The remainder of the mid and distal vessel. The diagonal is moderate in caliber and has ostial 70% stenosis.  Circumflex Artery: Large caliber vessel with mild proximal stenosis. The first obtuse marginal branch is moderate in caliber with 99% proximal stenosis. There is a moderate caliber posterolateral branch with no disease noted.  Right Coronary Artery: 100% proximal occlusion, chronic appearing.  Left Ventricular Angiogram: LVEF=55-60%.  Impression:  1. Severe triple vessel CAD  2. Unstable angina  3. Preserved LV systolic function   Assessment / Plan:    Preinfarction angina with chest pain and diaphoresis with 3 vessel coronary artery disease negative troponin Diabetes mellitus without complication Hypertension History of tobacco abuse a. 57 year hx, quit 03/2012. Fibromyalgia  I discussed with the patient the findings of significant 3 vessel coronary artery disease and unstable anginal symptoms, because of the significant stenoses which are markedly symptomatic I agree with recommendation by cardiology to proceed with coronary artery bypass grafting. The risks and options  have been discussed with the patient in detail. He and his wife's questions have been answered we'll plan to proceed on Friday morning.  The goals risks and alternatives of the planned surgical procedure CABG  have been discussed with the patient in detail. The risks  of the procedure including death, infection, stroke, myocardial infarction, bleeding, blood transfusion have all been discussed specifically.  I have quoted Mellody Memos a 3 % of perioperative mortality and a complication rate as high as 25%. The patient's questions have been answered.ESAI STECKLEIN is willing  to proceed with the planned procedure.  Delight Ovens MD  Beeper [phone] Office [phone] 08/23/2012 5:18 PM

## 2012-08-23 NOTE — CV Procedure (Signed)
    Cardiac Catheterization Operative Report  Timothy Cervantes [id number] 3/26/20142:42 PM [name], MD  Procedure Performed:  1. Left Heart Catheterization 2. Selective Coronary Angiography 3. Left ventricular angiogram  Operator: Verne Carrow, MD  Arterial access site:  Right radial artery.   Indication:  72 yo WM with history of DM, HTN, long term tobacco abuse who presented to the ED with c/o chest and jaw pain. EKG without ischemic changes. First troponin negative despite ongoing severe chest pain for 4 hours. Urgent cardiac cath to exclude obstructive CAD.                            Procedure Details: The risks, benefits, complications, treatment options, and expected outcomes were discussed with the patient. The patient and/or family concurred with the proposed plan, giving informed consent. The patient was brought to the cath lab after IV hydration was begun and oral premedication was given. The patient was further sedated with Versed and Fentanyl. The right wrist was assessed with an Allens test which was positive. The right wrist was prepped and draped in a sterile fashion. 1% lidocaine was used for local anesthesia. Using the modified Seldinger access technique, a 6 French sheath was placed in the right radial artery. 3 mg Verapamil was given through the sheath. He had been given 4000 units IV heparin in the ED. Standard diagnostic catheters were used to perform selective coronary angiography. A pigtail catheter was used to perform a left ventricular angiogram. The sheath was removed from the right radial artery and a Terumo hemostasis band was applied at the arteriotomy site on the right wrist.    There were no immediate complications. The patient was taken to the recovery area in stable condition.   Hemodynamic Findings: Central aortic pressure: 108/68 Left ventricular pressure: 110/3/8  Angiographic Findings:  Left main:  No obstructive disease.   Left  Anterior Descending Artery: Large caliber vessel that courses to the apex. The proximal vessel is severely calcified and has diffuse 70% stenosis. The mid vessel is calcified and has a focal 90% stenosis. The remainder of the mid and distal vessel. The diagonal is moderate in caliber and has ostial 70% stenosis.   Circumflex Artery: Large caliber vessel with mild proximal stenosis. The first obtuse marginal branch is moderate in caliber with 99% proximal stenosis. There is a moderate caliber posterolateral branch with no disease noted.   Right Coronary Artery: 100% proximal occlusion, chronic appearing.   Left Ventricular Angiogram: LVEF=55-60%.   Impression: 1. Severe triple vessel CAD 2. Unstable angina 3. Preserved LV systolic function  Recommendations: He will need consideration for surgical revascularization. Will consult CT surgery for CABG. Will start IV heparin. Will start metoprolol, ASA, statin. Echocardiogram.        Complications:  None. The patient tolerated the procedure well.

## 2012-08-24 ENCOUNTER — Inpatient Hospital Stay (HOSPITAL_COMMUNITY): Payer: Medicare Other

## 2012-08-24 DIAGNOSIS — I369 Nonrheumatic tricuspid valve disorder, unspecified: Secondary | ICD-10-CM

## 2012-08-24 DIAGNOSIS — I10 Essential (primary) hypertension: Secondary | ICD-10-CM

## 2012-08-24 DIAGNOSIS — E119 Type 2 diabetes mellitus without complications: Secondary | ICD-10-CM

## 2012-08-24 DIAGNOSIS — I2510 Atherosclerotic heart disease of native coronary artery without angina pectoris: Secondary | ICD-10-CM

## 2012-08-24 DIAGNOSIS — Z01810 Encounter for preprocedural cardiovascular examination: Secondary | ICD-10-CM

## 2012-08-24 DIAGNOSIS — I214 Non-ST elevation (NSTEMI) myocardial infarction: Secondary | ICD-10-CM

## 2012-08-24 LAB — HEMOGLOBIN A1C
Hgb A1c MFr Bld: 7.4 % — ABNORMAL HIGH (ref <5.7)
Mean Plasma Glucose: 166 mg/dL — ABNORMAL HIGH (ref <117)

## 2012-08-24 LAB — CBC
MCH: 30.2 pg (ref 26.0–34.0)
Platelets: 195 10*3/uL (ref 150–400)
RBC: 4.2 MIL/uL — ABNORMAL LOW (ref 4.22–5.81)
WBC: 9.6 10*3/uL (ref 4.0–10.5)

## 2012-08-24 LAB — COMPREHENSIVE METABOLIC PANEL
ALT: 51 U/L (ref 0–53)
ALT: 49 U/L (ref 0–53)
AST: 61 U/L — ABNORMAL HIGH (ref 0–37)
Albumin: 3 g/dL — ABNORMAL LOW (ref 3.5–5.2)
Alkaline Phosphatase: 40 U/L (ref 39–117)
Alkaline Phosphatase: 44 U/L (ref 39–117)
BUN: 24 mg/dL — ABNORMAL HIGH (ref 6–23)
BUN: 20 mg/dL (ref 6–23)
CO2: 23 mEq/L (ref 19–32)
CO2: 23 mEq/L (ref 19–32)
Calcium: 8.5 mg/dL (ref 8.4–10.5)
Calcium: 8.6 mg/dL (ref 8.4–10.5)
Chloride: 106 mEq/L (ref 96–112)
Creatinine, Ser: 0.83 mg/dL (ref 0.50–1.35)
GFR calc Af Amer: >90 mL/min (ref >90)
GFR calc Af Amer: >90 mL/min (ref >90)
GFR calc non Af Amer: 87 mL/min — ABNORMAL LOW (ref >90)
GFR calc non Af Amer: 88 mL/min — ABNORMAL LOW (ref >90)
Glucose, Bld: 150 mg/dL — ABNORMAL HIGH (ref 70–99)
Glucose, Bld: 166 mg/dL — ABNORMAL HIGH (ref 70–99)
Potassium: 4.1 mEq/L (ref 3.5–5.1)
Potassium: 4 mEq/L (ref 3.5–5.1)
Sodium: 137 mEq/L (ref 135–145)
Sodium: 139 mEq/L (ref 135–145)
Total Bilirubin: 1.4 mg/dL — ABNORMAL HIGH (ref 0.3–1.2)
Total Protein: 5.9 g/dL — ABNORMAL LOW (ref 6.0–8.3)

## 2012-08-24 LAB — URINALYSIS, ROUTINE W REFLEX MICROSCOPIC
Bilirubin Urine: NEGATIVE
Glucose, UA: NEGATIVE mg/dL
Hgb urine dipstick: NEGATIVE
Ketones, ur: NEGATIVE mg/dL
Leukocytes, UA: NEGATIVE
Nitrite: NEGATIVE
Protein, ur: NEGATIVE mg/dL
Specific Gravity, Urine: 1.022 (ref 1.005–1.030)
Urobilinogen, UA: 1 mg/dL (ref 0.0–1.0)
pH: 6.5 (ref 5.0–8.0)

## 2012-08-24 LAB — POCT I-STAT 3, ART BLOOD GAS (G3+)
Bicarbonate: 21.9 mEq/L (ref 20.0–24.0)
TCO2: 23 mmol/L (ref 0–100)
pCO2 arterial: 33.2 mmHg — ABNORMAL LOW (ref 35.0–45.0)
pH, Arterial: 7.429 (ref 7.350–7.450)

## 2012-08-24 LAB — TYPE AND SCREEN
ABO/RH(D): A POS
Antibody Screen: NEGATIVE

## 2012-08-24 LAB — HEPARIN LEVEL (UNFRACTIONATED)
Heparin Unfractionated: 0.2 IU/mL — ABNORMAL LOW (ref 0.30–0.70)
Heparin Unfractionated: 0.59 IU/mL (ref 0.30–0.70)

## 2012-08-24 LAB — LIPID PANEL: Cholesterol: 159 mg/dL (ref 0–200)

## 2012-08-24 LAB — ABO/RH: ABO/RH(D): A POS

## 2012-08-24 MED ORDER — PHENYLEPHRINE HCL 10 MG/ML IJ SOLN
30.0000 ug/min | INTRAVENOUS | Status: AC
  Administered 2012-08-25: 10 ug/min via INTRAVENOUS
  Filled 2012-08-24: qty 2

## 2012-08-24 MED ORDER — SODIUM CHLORIDE 0.9 % IV SOLN
INTRAVENOUS | Status: AC
  Administered 2012-08-25: 12:00:00 via INTRAVENOUS
  Administered 2012-08-25: 69.8 mL/h via INTRAVENOUS
  Filled 2012-08-24: qty 40

## 2012-08-24 MED ORDER — CHLORHEXIDINE GLUCONATE 4 % EX LIQD
60.0000 mL | Freq: Once | CUTANEOUS | Status: AC
  Administered 2012-08-24: 4 via TOPICAL
  Filled 2012-08-24: qty 60

## 2012-08-24 MED ORDER — SODIUM CHLORIDE 0.9 % IV SOLN
INTRAVENOUS | Status: AC
  Administered 2012-08-25: 2.1 [IU]/h via INTRAVENOUS
  Filled 2012-08-24: qty 1

## 2012-08-24 MED ORDER — CHLORHEXIDINE GLUCONATE 4 % EX LIQD
60.0000 mL | Freq: Once | CUTANEOUS | Status: AC
  Administered 2012-08-25: 4 via TOPICAL
  Filled 2012-08-24: qty 60

## 2012-08-24 MED ORDER — NITROGLYCERIN IN D5W 200-5 MCG/ML-% IV SOLN
2.0000 ug/min | INTRAVENOUS | Status: DC
  Filled 2012-08-24: qty 250

## 2012-08-24 MED ORDER — BISACODYL 5 MG PO TBEC
5.0000 mg | DELAYED_RELEASE_TABLET | Freq: Once | ORAL | Status: AC
  Administered 2012-08-24: 5 mg via ORAL
  Filled 2012-08-24: qty 1

## 2012-08-24 MED ORDER — MAGNESIUM SULFATE 50 % IJ SOLN
40.0000 meq | INTRAMUSCULAR | Status: DC
  Filled 2012-08-24: qty 10

## 2012-08-24 MED ORDER — METOPROLOL TARTRATE 12.5 MG HALF TABLET
12.5000 mg | ORAL_TABLET | Freq: Once | ORAL | Status: AC
  Administered 2012-08-25: 12.5 mg via ORAL
  Filled 2012-08-24: qty 1

## 2012-08-24 MED ORDER — DEXTROSE 5 % IV SOLN
750.0000 mg | INTRAVENOUS | Status: DC
  Filled 2012-08-24: qty 750

## 2012-08-24 MED ORDER — DEXMEDETOMIDINE HCL IN NACL 400 MCG/100ML IV SOLN
0.1000 ug/kg/h | INTRAVENOUS | Status: AC
  Administered 2012-08-25: 14:00:00 via INTRAVENOUS
  Administered 2012-08-25: 0.3 ug/kg/h via INTRAVENOUS
  Filled 2012-08-24: qty 100

## 2012-08-24 MED ORDER — PHENOL 1.4 % MT LIQD
1.0000 | OROMUCOSAL | Status: DC | PRN
  Filled 2012-08-24: qty 177

## 2012-08-24 MED ORDER — AMIODARONE HCL 200 MG PO TABS
200.0000 mg | ORAL_TABLET | Freq: Two times a day (BID) | ORAL | Status: DC
  Administered 2012-08-24 (×2): 200 mg via ORAL
  Filled 2012-08-24 (×5): qty 1

## 2012-08-24 MED ORDER — OXYMETAZOLINE HCL 0.05 % NA SOLN
1.0000 | Freq: Two times a day (BID) | NASAL | Status: DC
  Administered 2012-08-24 (×3): 1 via NASAL
  Filled 2012-08-24 (×2): qty 15

## 2012-08-24 MED ORDER — VANCOMYCIN HCL 10 G IV SOLR
1500.0000 mg | INTRAVENOUS | Status: AC
  Administered 2012-08-25: 1500 mg via INTRAVENOUS
  Filled 2012-08-24: qty 1500

## 2012-08-24 MED ORDER — TEMAZEPAM 15 MG PO CAPS: 15.0000 mg | ORAL_CAPSULE | Freq: Once | ORAL | Status: AC | PRN

## 2012-08-24 MED ORDER — EPINEPHRINE HCL 1 MG/ML IJ SOLN
0.5000 ug/min | INTRAVENOUS | Status: DC
  Filled 2012-08-24: qty 4

## 2012-08-24 MED ORDER — DOPAMINE-DEXTROSE 3.2-5 MG/ML-% IV SOLN
2.0000 ug/kg/min | INTRAVENOUS | Status: DC
  Filled 2012-08-24: qty 250

## 2012-08-24 MED ORDER — PLASMA-LYTE 148 IV SOLN
INTRAVENOUS | Status: AC
  Administered 2012-08-25: 09:00:00
  Filled 2012-08-24: qty 2.5

## 2012-08-24 MED ORDER — POTASSIUM CHLORIDE 2 MEQ/ML IV SOLN
80.0000 meq | INTRAVENOUS | Status: DC
  Filled 2012-08-24: qty 40

## 2012-08-24 MED ORDER — ALBUTEROL SULFATE (5 MG/ML) 0.5% IN NEBU
2.5000 mg | INHALATION_SOLUTION | Freq: Once | RESPIRATORY_TRACT | Status: AC
  Administered 2012-08-24: 2.5 mg via RESPIRATORY_TRACT

## 2012-08-24 MED ORDER — DEXTROSE 5 % IV SOLN
1.5000 g | INTRAVENOUS | Status: AC
  Administered 2012-08-25: 1.5 g via INTRAVENOUS
  Administered 2012-08-25: .75 g via INTRAVENOUS
  Filled 2012-08-24: qty 1.5

## 2012-08-24 NOTE — Progress Notes (Signed)
VASCULAR LAB PRELIMINARY  PRELIMINARY  PRELIMINARY  PRELIMINARY  Pre-op Cardiac Surgery  Carotid Findings:  Bilateral:  No evidence of hemodynamically significant internal carotid artery stenosis.   Vertebral artery flow is antegrade.    Mylo Choi , RVT 08/24/2012 12:00 PM   Upper Extremity Right Left  Brachial Pressures    Radial Waveforms    Ulnar Waveforms    Palmar Arch (Allen's Test)     Findings:      Lower  Extremity Right Left  Dorsalis Pedis    Anterior Tibial    Posterior Tibial    Ankle/Brachial Indices      Findings:

## 2012-08-24 NOTE — Progress Notes (Signed)
PFT completed at bedside. Unconfirmed results placed in Progress Notes of Shadow Chart. Pt declined Pre-Op teaching.

## 2012-08-24 NOTE — Progress Notes (Signed)
ANTICOAGULATION CONSULT NOTE  Pharmacy Consult for Heparin Indication: 3v CAD awaiting CABG  No Known Allergies  Patient Measurements: Height: 6\' 1"  (185.4 cm) Weight: 255 lb 11.7 oz (116 kg) IBW/kg (Calculated) : 79.9 Heparin Dosing Weight: 105 Kg  Vital Signs: Temp: 98.3 F (36.8 C) (03/26 2342) Temp src: Oral (03/26 2342) BP: 97/84 mmHg (03/27 0300) Pulse Rate: 76 (03/27 0300)  Labs:  Recent Labs  08/23/12 1242 08/23/12 1644 08/23/12 2307 08/24/12 0340  HGB 14.5 13.3  --  12.7*  HCT 42.4 39.6  --  38.1*  PLT 253 200  --  195  APTT 28  --   --   --   LABPROT 12.5  --   --   --   INR 0.94  --   --   --   HEPARINUNFRC  --   --   --  0.11*  CREATININE 1.06 0.89  --   --   TROPONINI  --  <0.30 3.15*  --     Estimated Creatinine Clearance: 104.5 ml/min (by C-G formula based on Cr of 0.89).  Assessment: 73 yo male with CAD s/p cath, awaiting CABG Friday, for heparin   Goal of Therapy:  Heparin level 0.3-0.7 units/ml Monitor platelets by anticoagulation protocol: Yes   Plan:  Increase Heparin 1650 units/hr Check heparin level in 8 hours.  Geannie Risen, PharmD, BCPS 08/24/2012 4:15 AM

## 2012-08-24 NOTE — Progress Notes (Signed)
CRITICAL VALUE ALERT  Critical value received: troponin  3.15  Date of notification:  08/24/12  Time of notification:  0045  Critical value read back:yes  Nurse who received alert:  Burna Cash RN  MD notified (1st page):  Dr. Conchita Paris  Time of first page:  0045  MD notified (2nd page):  Time of second page:  Responding MD:    Time MD responded:

## 2012-08-24 NOTE — Progress Notes (Addendum)
Patient ID: Timothy Cervantes, male   DOB: [birth year], 72 y.o.   MRN: [id number] TCTS DAILY PROGRESS NOTE                   [address]            [place] [address]          [phone]      1 Day Post-Op Procedure(s) (LRB): LEFT HEART CATHETERIZATION WITH CORONARY ANGIOGRAM (N/A)  Total Length of Stay:  LOS: 1 day   Subjective: On NTG drip this am No chest pain today, transient on dopamine yesterday, now off  Objective: Vital signs in last 24 hours: Temp:  [97.6 F (36.4 C)-98.5 F (36.9 C)] 98.1 F (36.7 C) (03/27 0749) Pulse Rate:  [55-90] 74 (03/27 0800) Cardiac Rhythm:  [-] Normal sinus rhythm (03/27 0600) Resp:  [12-26] 12 (03/27 0800) BP: (73-140)/(31-84) 103/53 mmHg (03/27 0800) SpO2:  [95 %-100 %] 100 % (03/27 0800) Weight:  [250 lb (113.399 kg)-255 lb 15.3 oz (116.1 kg)] 255 lb 15.3 oz (116.1 kg) (03/27 0400)  Filed Weights   08/23/12 1253 08/23/12 1612 08/24/12 0400  Weight: 250 lb (113.399 kg) 255 lb 11.7 oz (116 kg) 255 lb 15.3 oz (116.1 kg)    Weight change:    Hemodynamic parameters for last 24 hours:    Intake/Output from previous day: 03/26 0701 - 03/27 0700 In: 2505 [P.O.:600; I.V.:1905] Out: 1550 [Urine:1550]  Intake/Output this shift: Total I/O In: 91.5 [I.V.:91.5] Out: 200 [Urine:200]  Current Meds: Scheduled Meds: . amiodarone  200 mg Oral BID  . aspirin EC  81 mg Oral Daily  . atorvastatin  80 mg Oral q1800  . buPROPion  300 mg Oral Daily  . cholecalciferol  1,000 Units Oral Daily  . insulin aspart  0-15 Units Subcutaneous TID WC  . metoprolol tartrate  12.5 mg Oral BID  . oxymetazoline  1 spray Each Nare BID  . pantoprazole  40 mg Oral Daily  . sodium chloride  3 mL Intravenous Q12H  . Vilazodone HCl  40 mg Oral Daily   Continuous Infusions: . sodium chloride 75 mL/hr at 08/24/12 0400  . DOPamine 2 mcg/kg/min (08/23/12 1810)  . heparin 1,650 Units/hr (08/24/12 0800)  . nitroGLYCERIN 33.3 mcg/min (08/24/12  0400)   PRN Meds:.sodium chloride, acetaminophen, acetaminophen, nitroGLYCERIN, ondansetron (ZOFRAN) IV, ondansetron (ZOFRAN) IV, oxyCODONE, oxyCODONE-acetaminophen, oxyCODONE-acetaminophen, sodium chloride, traZODone, zolpidem  General appearance: alert and cooperative Neurologic: intact Heart: regular rate and rhythm, S1, S2 normal, no murmur, click, rub or gallop Lungs: clear to auscultation bilaterally Abdomen: soft, non-tender; bowel sounds normal; no masses,  no organomegaly Extremities: extremities normal, atraumatic, no cyanosis or edema and Homans sign is negative, no sign of DVT  Lab Results: CBC: Recent Labs  08/23/12 1644 08/24/12 0340  WBC 9.3 9.6  HGB 13.3 12.7*  HCT 39.6 38.1*  PLT 200 195   BMET:  Recent Labs  08/23/12 1242 08/23/12 1644 08/24/12 0340  NA 138  --  137  K 4.5  --  4.1  CL 102  --  105  CO2 24  --  23  GLUCOSE 259*  --  150*  BUN 26*  --  24*  CREATININE 1.06 0.89 0.84  CALCIUM 9.7  --  8.5    PT/INR:  Recent Labs  08/23/12 1242  LABPROT 12.5  INR 0.94   Radiology: Dg Chest Portable 1 View  08/23/2012  *RADIOLOGY REPORT*  Clinical Data: Chest pain.  Labored breathing.  PORTABLE CHEST - 1 VIEW  Comparison: 08/31/2007  Findings: Heart is mildly enlarged.  No confluent airspace opacities or effusions.  No acute bony abnormality.  IMPRESSION: Borderline heart size.   Original Report Authenticated By: Charlett Nose, M.D.    Lab Results  Component Value Date   CKTOTAL 124 08/31/2007   CKMB 3.1 08/31/2007   TROPONINI 8.76* 08/24/2012    Assessment/Plan: S/P Procedure(s) (LRB): LEFT HEART CATHETERIZATION WITH CORONARY ANGIOGRAM (N/A) For CABG in am, no further questions. Troponin elevated to 8.76, evidence of non st elevation mi preop     Timothy Cervantes B 08/24/2012 8:45 AM

## 2012-08-24 NOTE — Progress Notes (Signed)
[id number] Did not walk with pt as he has been having some CP. Discussed activity and how important walking is after surgery. Pt states he is motivated to walk. Reviewed sternal precautions with pt. Pt does not IS yet but discussed importance. Quit smoking over a year ago but has been chewing. Pt knows he is to quit this now. Gave [facility] booklet. Pt does not want to see preop video. We will follow up after surgery. Luetta Nutting RN BSN

## 2012-08-24 NOTE — Progress Notes (Signed)
ANTICOAGULATION CONSULT NOTE - Follow Up Consult  Pharmacy Consult for Heparin Indication: 3V CAD awaiting CABG  No Known Allergies  Patient Measurements: Height: 6\' 1"  (185.4 cm) Weight: 255 lb 15.3 oz (116.1 kg) IBW/kg (Calculated) : 79.9 Heparin Dosing Weight: 105 kg  Vital Signs: Temp: 98.2 F (36.8 C) (03/27 1203) Temp src: Oral (03/27 1203) BP: 114/63 mmHg (03/27 1400) Pulse Rate: 71 (03/27 1400)  Labs:  Recent Labs  08/23/12 1242 08/23/12 1644 08/23/12 2307 08/24/12 0340 08/24/12 1240  HGB 14.5 13.3  --  12.7*  --   HCT 42.4 39.6  --  38.1*  --   PLT 253 200  --  195  --   APTT 28  --   --   --   --   LABPROT 12.5  --   --   --   --   INR 0.94  --   --   --   --   HEPARINUNFRC  --   --   --  0.11* 0.20*  CREATININE 1.06 0.89  --  0.84 0.83  TROPONINI  --  <0.30 3.15* 8.76*  --     Estimated Creatinine Clearance: 112.2 ml/min (by C-G formula based on Cr of 0.83).   Medications:  Infusions:  . [EXPIRED] sodium chloride 100 mL/hr at 08/24/12 0000  . sodium chloride 75 mL/hr at 08/24/12 0400  . DOPamine 2 mcg/kg/min (08/23/12 1810)  . heparin 1,650 Units/hr (08/24/12 1200)  . nitroGLYCERIN 10 mcg/min (08/24/12 1400)  . [DISCONTINUED] sodium chloride    . [DISCONTINUED] sodium chloride 300 mL (08/23/12 1510)  . [DISCONTINUED] heparin 1,000 Units/hr (08/23/12 1316)    Assessment: 72 year old male with CAD awaiting CABG.  His heparin level remains subtherapeutic but is approaching goal.  Goal of Therapy:  Heparin level 0.3-0.7 units/ml Monitor platelets by anticoagulation protocol: Yes   Plan:  Increase Heparin to 1950 units/hr Check Heparin level in 6 hours.  Estella Husk, Pharm.D., BCPS Clinical Pharmacist  Phone [phone] Pager [phone] 08/24/2012, 2:48 PM

## 2012-08-24 NOTE — Progress Notes (Addendum)
    SUBJECTIVE: Mild chest pain overnight. Pain free this am. No SOB.   Tele: NSR. Review shows PACs with short runs of SVT, some PVCs with short runs of NSVT.   BP 103/36  Pulse 77  Temp(Src) 98.5 F (36.9 C) (Oral)  Resp 19  Ht 6\' 1"  (1.854 m)  Wt 255 lb 15.3 oz (116.1 kg)  BMI 33.78 kg/m2  SpO2 97%  Intake/Output Summary (Last 24 hours) at 08/24/12 0744 Last data filed at 08/24/12 0700  Gross per 24 hour  Intake   2505 ml  Output   1550 ml  Net    955 ml    PHYSICAL EXAM General: Well developed, well nourished, in no acute distress. Alert and oriented x 3.  Psych:  Good affect, responds appropriately Neck: No JVD. No masses noted.  Lungs: Clear bilaterally with no wheezes or rhonci noted.  Heart: RRR with no murmurs noted. Abdomen: Bowel sounds are present. Soft, non-tender.  Extremities: No lower extremity edema.   LABS: Basic Metabolic Panel:  Recent Labs  40/98/11 1242 08/23/12 1644 08/24/12 0340  NA 138  --  137  K 4.5  --  4.1  CL 102  --  105  CO2 24  --  23  GLUCOSE 259*  --  150*  BUN 26*  --  24*  CREATININE 1.06 0.89 0.84  CALCIUM 9.7  --  8.5   CBC:  Recent Labs  08/23/12 1644 08/24/12 0340  WBC 9.3 9.6  HGB 13.3 12.7*  HCT 39.6 38.1*  MCV 90.2 90.7  PLT 200 195   Cardiac Enzymes:  Recent Labs  08/23/12 1644 08/23/12 2307 08/24/12 0340  TROPONINI <0.30 3.15* 8.76*   Fasting Lipid Panel:  Recent Labs  08/24/12 0340  CHOL 159  HDL 49  LDLCALC 88  TRIG 108  CHOLHDL 3.2    Current Meds: . aspirin EC  81 mg Oral Daily  . atorvastatin  80 mg Oral q1800  . buPROPion  300 mg Oral Daily  . cholecalciferol  1,000 Units Oral Daily  . insulin aspart  0-15 Units Subcutaneous TID WC  . metoprolol tartrate  12.5 mg Oral BID  . oxymetazoline  1 spray Each Nare BID  . pantoprazole  40 mg Oral Daily  . sodium chloride  3 mL Intravenous Q12H  . Vilazodone HCl  40 mg Oral Daily   EKG: NSR, old inferior infarct.   ASSESSMENT  AND PLAN:  1. 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 Troponin is elevated. He has been seen by Dr. Tyrone Sage with CT surgery and plans are in place for multi-vessel CABG on Friday 08/25/12. Will continue ASA, statin, beta blocker, IV heparin, IV NTG. One episode of bradycardia yesterday but stable overnight.  Some ectopy with short runs of VT and SVT. Will start amiodarone pre-op.   2. DM: SSI  3. HTN: BP controlled.     Timothy Cervantes  3/27/20147:44 AM

## 2012-08-24 NOTE — Plan of Care (Signed)
Problem: Consults Goal: Cardiac Surgery Patient Education ( See Patient Education module for education specifics.) Outcome: Progressing Pre-op and post-procedure teaching attempted throughout the day. However, patient is reluctant to listen and does not want to watch pre-op video at all today, stating that he has heard it from multiple disciplinary since yesterday and does not want to continue to hear it again.  Incentive spirometry worked with patient since this morning, however, patient does not do it Q2H as instructed. Pt kept saying he will do well working on the I/S after surgery. RN reinforced the need to do I/S today and provided information pertinent of today's pre-op workup and for optimal postop recovery. Patient stated "I am aware of it all". Will continue to provide education.

## 2012-08-24 NOTE — Progress Notes (Signed)
Echocardiogram 2D Echocardiogram has been performed.  Timothy Cervantes 08/24/2012, 11:59 AM

## 2012-08-24 NOTE — Progress Notes (Signed)
ANTICOAGULATION CONSULT NOTE - Follow Up    HL = 0.59 (goal 0.3 - 0.7 units/mL) Heparin weight = 105 kg   Assessment: 71 YOM on IV heparin while awaiting CABG.  Heparin level is therapeutic; no bleeding reported.   Plan: - Continue heparin gtt at 1950 units/hr - F/U AM labs     Lota Leamer D. Laney Potash, PharmD, BCPS Pager:  [phone] 08/24/2012, 10:23 PM

## 2012-08-24 NOTE — Progress Notes (Signed)
VASCULAR LAB PRELIMINARY  PRELIMINARY  PRELIMINARY  PRELIMINARY  Pre-op Cardiac Surgery  Carotid Findings:  Bilateral:  No evidence of hemodynamically significant internal carotid artery stenosis.   Vertebral artery flow is antegrade.       Upper Extremity Right Left  Brachial Pressures 116 Triphasic 128 Tri[hasic  Radial Waveforms Triphasic Triphasic  Ulnar Waveforms Triphasic Triphasic  Palmar Arch (Allen's Test) Normal Normal   Findings:   Doppler waveforms remained normal bilaterally with both radial and ulnar compressions.134   Lower  Extremity Right Left  Dorsalis Pedis 134 Biphasic 141 Biphasic      Posterior Tibial 144 Triphasic 135 Triphasic  Ankle/Brachial Indices 1.26 1.24    Findings:  ABIs and Doppler waveforms are within normal limits bilaterally at rest.   Eunice Oldaker, RVS 08/24/2012 4:41 PM

## 2012-08-25 ENCOUNTER — Encounter (HOSPITAL_COMMUNITY): Payer: Self-pay | Admitting: Anesthesiology

## 2012-08-25 ENCOUNTER — Encounter (HOSPITAL_COMMUNITY)
Admission: EM | Disposition: A | Discharge status: Home / Self Care | Payer: Self-pay | Source: Home / Self Care | Attending: Cardiothoracic Surgery

## 2012-08-25 ENCOUNTER — Inpatient Hospital Stay (HOSPITAL_COMMUNITY): Payer: Medicare Other | Admitting: Anesthesiology

## 2012-08-25 ENCOUNTER — Inpatient Hospital Stay (HOSPITAL_COMMUNITY): Payer: Medicare Other

## 2012-08-25 DIAGNOSIS — I2510 Atherosclerotic heart disease of native coronary artery without angina pectoris: Secondary | ICD-10-CM

## 2012-08-25 HISTORY — PX: CORONARY ARTERY BYPASS GRAFT: SHX141

## 2012-08-25 HISTORY — PX: INTRAOPERATIVE TRANSESOPHAGEAL ECHOCARDIOGRAM: SHX5062

## 2012-08-25 LAB — BASIC METABOLIC PANEL
BUN: 15 mg/dL (ref 6–23)
CO2: 23 mEq/L (ref 19–32)
Calcium: 8.7 mg/dL (ref 8.4–10.5)
Chloride: 105 mEq/L (ref 96–112)
Creatinine, Ser: 0.88 mg/dL (ref 0.50–1.35)
GFR calc Af Amer: >90 mL/min (ref >90)
GFR calc non Af Amer: 86 mL/min — ABNORMAL LOW (ref >90)
Glucose, Bld: 136 mg/dL — ABNORMAL HIGH (ref 70–99)
Potassium: 4.3 mEq/L (ref 3.5–5.1)
Sodium: 137 mEq/L (ref 135–145)

## 2012-08-25 LAB — POCT I-STAT 4, (NA,K, GLUC, HGB,HCT)
Glucose, Bld: 129 mg/dL — ABNORMAL HIGH (ref 70–99)
Glucose, Bld: 113 mg/dL — ABNORMAL HIGH (ref 70–99)
Glucose, Bld: 171 mg/dL — ABNORMAL HIGH (ref 70–99)
Glucose, Bld: 120 mg/dL — ABNORMAL HIGH (ref 70–99)
HCT: 32 % — ABNORMAL LOW (ref 39.0–52.0)
Hemoglobin: 10.5 g/dL — ABNORMAL LOW (ref 13.0–17.0)
Potassium: 5.1 mEq/L (ref 3.5–5.1)
Potassium: 4.1 mEq/L (ref 3.5–5.1)
Sodium: 135 mEq/L (ref 135–145)
Sodium: 137 mEq/L (ref 135–145)
Sodium: 140 mEq/L (ref 135–145)

## 2012-08-25 LAB — POCT I-STAT, CHEM 8
BUN: 17 mg/dL (ref 6–23)
Calcium, Ion: 1.17 mmol/L (ref 1.13–1.30)
Chloride: 107 mEq/L (ref 96–112)
Potassium: 4.3 mEq/L (ref 3.5–5.1)

## 2012-08-25 LAB — PLATELET COUNT: Platelets: 159 10*3/uL (ref 150–400)

## 2012-08-25 LAB — POCT I-STAT 3, ART BLOOD GAS (G3+)
Acid-base deficit: 3 mmol/L — ABNORMAL HIGH (ref 0.0–2.0)
Acid-base deficit: 1 mmol/L (ref 0.0–2.0)
Bicarbonate: 24.4 mEq/L — ABNORMAL HIGH (ref 20.0–24.0)
Bicarbonate: 23.7 mEq/L (ref 20.0–24.0)
Bicarbonate: 23.1 mEq/L (ref 20.0–24.0)
O2 Saturation: 100 %
O2 Saturation: 97 %
Patient temperature: 37.5
Patient temperature: 37.6
TCO2: 26 mmol/L (ref 0–100)
TCO2: 25 mmol/L (ref 0–100)
TCO2: 25 mmol/L (ref 0–100)
TCO2: 24 mmol/L (ref 0–100)
pCO2 arterial: 49.3 mmHg — ABNORMAL HIGH (ref 35.0–45.0)
pCO2 arterial: 46.4 mmHg — ABNORMAL HIGH (ref 35.0–45.0)
pCO2 arterial: 42.2 mmHg (ref 35.0–45.0)
pH, Arterial: 7.349 — ABNORMAL LOW (ref 7.350–7.450)
pO2, Arterial: 301 mmHg — ABNORMAL HIGH (ref 80.0–100.0)
pO2, Arterial: 86 mmHg (ref 80.0–100.0)

## 2012-08-25 LAB — CREATININE, SERUM
Creatinine, Ser: 0.83 mg/dL (ref 0.50–1.35)
GFR calc Af Amer: >90 mL/min (ref >90)
GFR calc non Af Amer: 88 mL/min — ABNORMAL LOW (ref >90)

## 2012-08-25 LAB — GLUCOSE, CAPILLARY: Glucose-Capillary: 161 mg/dL — ABNORMAL HIGH (ref 70–99)

## 2012-08-25 LAB — PROTIME-INR: INR: 1.29 (ref 0.00–1.49)

## 2012-08-25 LAB — CBC
HCT: 38.6 % — ABNORMAL LOW (ref 39.0–52.0)
HCT: 36.5 % — ABNORMAL LOW (ref 39.0–52.0)
HCT: 35.9 % — ABNORMAL LOW (ref 39.0–52.0)
Hemoglobin: 12.2 g/dL — ABNORMAL LOW (ref 13.0–17.0)
Hemoglobin: 12.6 g/dL — ABNORMAL LOW (ref 13.0–17.0)
Hemoglobin: 12.3 g/dL — ABNORMAL LOW (ref 13.0–17.0)
MCH: 30.4 pg (ref 26.0–34.0)
MCHC: 34.3 g/dL (ref 30.0–36.0)
MCV: 91.3 fL (ref 78.0–100.0)
MCV: 88.9 fL (ref 78.0–100.0)
Platelets: 120 10*3/uL — ABNORMAL LOW (ref 150–400)
RBC: 4.23 MIL/uL (ref 4.22–5.81)
RBC: 4.1 MIL/uL — ABNORMAL LOW (ref 4.22–5.81)
RBC: 4.04 MIL/uL — ABNORMAL LOW (ref 4.22–5.81)
RDW: 13.5 % (ref 11.5–15.5)
WBC: 6.6 10*3/uL (ref 4.0–10.5)
WBC: 11.2 10*3/uL — ABNORMAL HIGH (ref 4.0–10.5)
WBC: 9.5 10*3/uL (ref 4.0–10.5)

## 2012-08-25 LAB — HEMOGLOBIN AND HEMATOCRIT, BLOOD
HCT: 31 % — ABNORMAL LOW (ref 39.0–52.0)
Hemoglobin: 10.5 g/dL — ABNORMAL LOW (ref 13.0–17.0)

## 2012-08-25 LAB — POCT I-STAT GLUCOSE
Glucose, Bld: 126 mg/dL — ABNORMAL HIGH (ref 70–99)
Operator id: 282221

## 2012-08-25 LAB — APTT: aPTT: 32 seconds (ref 24–37)

## 2012-08-25 LAB — MAGNESIUM: Magnesium: 2.6 mg/dL — ABNORMAL HIGH (ref 1.5–2.5)

## 2012-08-25 SURGERY — CORONARY ARTERY BYPASS GRAFTING (CABG)
Anesthesia: General | Site: Chest | Wound class: Clean

## 2012-08-25 MED ORDER — SODIUM CHLORIDE 0.9 % IV SOLN
1.0000 g/h | INTRAVENOUS | Status: DC
  Filled 2012-08-25: qty 20

## 2012-08-25 MED ORDER — OXYCODONE HCL 5 MG PO TABS
5.0000 mg | ORAL_TABLET | ORAL | Status: DC | PRN
  Administered 2012-08-26 – 2012-08-28 (×9): 10 mg via ORAL
  Filled 2012-08-25 (×9): qty 2

## 2012-08-25 MED ORDER — NITROGLYCERIN IN D5W 200-5 MCG/ML-% IV SOLN: 0.0000 ug/min | INTRAVENOUS | Status: DC

## 2012-08-25 MED ORDER — BUPROPION HCL ER (XL) 300 MG PO TB24
300.0000 mg | ORAL_TABLET | Freq: Every day | ORAL | Status: DC
  Administered 2012-08-26 – 2012-08-31 (×6): 300 mg via ORAL
  Filled 2012-08-25 (×6): qty 1

## 2012-08-25 MED ORDER — LACTATED RINGERS IV SOLN
INTRAVENOUS | Status: DC | PRN
  Administered 2012-08-25 (×2): via INTRAVENOUS

## 2012-08-25 MED ORDER — HEPARIN SODIUM (PORCINE) 1000 UNIT/ML IJ SOLN
INTRAMUSCULAR | Status: DC | PRN
  Administered 2012-08-25: 36000 [IU] via INTRAVENOUS

## 2012-08-25 MED ORDER — METOPROLOL TARTRATE 25 MG/10 ML ORAL SUSPENSION
12.5000 mg | Freq: Two times a day (BID) | ORAL | Status: DC
  Filled 2012-08-25 (×13): qty 5

## 2012-08-25 MED ORDER — ARTIFICIAL TEARS OP OINT
TOPICAL_OINTMENT | OPHTHALMIC | Status: DC | PRN
  Administered 2012-08-25: 1 via OPHTHALMIC

## 2012-08-25 MED ORDER — PANTOPRAZOLE SODIUM 40 MG PO TBEC
40.0000 mg | DELAYED_RELEASE_TABLET | Freq: Every day | ORAL | Status: DC
  Administered 2012-08-27: 40 mg via ORAL
  Filled 2012-08-25: qty 1

## 2012-08-25 MED ORDER — FENTANYL CITRATE 0.05 MG/ML IJ SOLN
INTRAMUSCULAR | Status: DC | PRN
  Administered 2012-08-25: 250 ug via INTRAVENOUS
  Administered 2012-08-25: 200 ug via INTRAVENOUS
  Administered 2012-08-25: 250 ug via INTRAVENOUS
  Administered 2012-08-25: 100 ug via INTRAVENOUS
  Administered 2012-08-25: 150 ug via INTRAVENOUS
  Administered 2012-08-25: 50 ug via INTRAVENOUS
  Administered 2012-08-25 (×2): 250 ug via INTRAVENOUS
  Administered 2012-08-25: 100 ug via INTRAVENOUS

## 2012-08-25 MED ORDER — LACTATED RINGERS IV SOLN: INTRAVENOUS | Status: DC

## 2012-08-25 MED ORDER — SODIUM CHLORIDE 0.9 % IV SOLN
INTRAVENOUS | Status: DC
  Filled 2012-08-25: qty 1

## 2012-08-25 MED ORDER — ALBUMIN HUMAN 5 % IV SOLN
INTRAVENOUS | Status: DC | PRN
  Administered 2012-08-25 (×2): via INTRAVENOUS

## 2012-08-25 MED ORDER — SODIUM CHLORIDE 0.9 % IJ SOLN
3.0000 mL | Freq: Two times a day (BID) | INTRAMUSCULAR | Status: DC
  Administered 2012-08-26 – 2012-08-28 (×4): 3 mL via INTRAVENOUS

## 2012-08-25 MED ORDER — LACTATED RINGERS IV SOLN
INTRAVENOUS | Status: DC | PRN
  Administered 2012-08-25 (×2): via INTRAVENOUS

## 2012-08-25 MED ORDER — MIDAZOLAM HCL 5 MG/5ML IJ SOLN
INTRAMUSCULAR | Status: DC | PRN
  Administered 2012-08-25 (×2): 2 mg via INTRAVENOUS
  Administered 2012-08-25: 4 mg via INTRAVENOUS
  Administered 2012-08-25: 2 mg via INTRAVENOUS
  Administered 2012-08-25: 3 mg via INTRAVENOUS
  Administered 2012-08-25 (×2): 2 mg via INTRAVENOUS
  Administered 2012-08-25: 3 mg via INTRAVENOUS
  Administered 2012-08-25: 2 mg via INTRAVENOUS

## 2012-08-25 MED ORDER — MORPHINE SULFATE 2 MG/ML IJ SOLN
1.0000 mg | INTRAMUSCULAR | Status: AC | PRN
  Filled 2012-08-25 (×2): qty 2

## 2012-08-25 MED ORDER — ONDANSETRON HCL 4 MG/2ML IJ SOLN
4.0000 mg | Freq: Four times a day (QID) | INTRAMUSCULAR | Status: DC | PRN
  Administered 2012-08-25: 4 mg via INTRAVENOUS
  Filled 2012-08-25: qty 2

## 2012-08-25 MED ORDER — ASPIRIN EC 325 MG PO TBEC
325.0000 mg | DELAYED_RELEASE_TABLET | Freq: Every day | ORAL | Status: DC
  Administered 2012-08-27: 325 mg via ORAL
  Filled 2012-08-25 (×3): qty 1

## 2012-08-25 MED ORDER — MAGNESIUM SULFATE 40 MG/ML IJ SOLN
4.0000 g | Freq: Once | INTRAMUSCULAR | Status: AC
  Administered 2012-08-25: 4 g via INTRAVENOUS
  Filled 2012-08-25: qty 100

## 2012-08-25 MED ORDER — SODIUM CHLORIDE 0.9 % IJ SOLN
OROMUCOSAL | Status: DC | PRN
  Administered 2012-08-25: 11:00:00 via TOPICAL

## 2012-08-25 MED ORDER — ACETAMINOPHEN 500 MG PO TABS
1000.0000 mg | ORAL_TABLET | Freq: Four times a day (QID) | ORAL | Status: DC
  Administered 2012-08-26 – 2012-08-28 (×9): 1000 mg via ORAL
  Filled 2012-08-25 (×12): qty 2

## 2012-08-25 MED ORDER — METOPROLOL TARTRATE 1 MG/ML IV SOLN: 2.5000 mg | INTRAVENOUS | Status: DC | PRN

## 2012-08-25 MED ORDER — PROTAMINE SULFATE 10 MG/ML IV SOLN
INTRAVENOUS | Status: DC | PRN
  Administered 2012-08-25 (×2): 10 mg via INTRAVENOUS
  Administered 2012-08-25: 90 mg via INTRAVENOUS
  Administered 2012-08-25 (×5): 10 mg via INTRAVENOUS
  Administered 2012-08-25: 50 mg via INTRAVENOUS
  Administered 2012-08-25 (×4): 10 mg via INTRAVENOUS
  Administered 2012-08-25 (×2): 25 mg via INTRAVENOUS

## 2012-08-25 MED ORDER — SODIUM CHLORIDE 0.9 % IJ SOLN: 3.0000 mL | INTRAMUSCULAR | Status: DC | PRN

## 2012-08-25 MED ORDER — ACETAMINOPHEN 160 MG/5ML PO SOLN: 975.0000 mg | Freq: Four times a day (QID) | ORAL | Status: DC

## 2012-08-25 MED ORDER — FAMOTIDINE IN NACL 20-0.9 MG/50ML-% IV SOLN
20.0000 mg | Freq: Two times a day (BID) | INTRAVENOUS | Status: AC
  Administered 2012-08-25: 20 mg via INTRAVENOUS

## 2012-08-25 MED ORDER — DOCUSATE SODIUM 100 MG PO CAPS
200.0000 mg | ORAL_CAPSULE | Freq: Every day | ORAL | Status: DC
  Administered 2012-08-26 – 2012-08-27 (×2): 200 mg via ORAL
  Filled 2012-08-25 (×2): qty 2

## 2012-08-25 MED ORDER — ALBUMIN HUMAN 5 % IV SOLN
250.0000 mL | INTRAVENOUS | Status: AC | PRN
  Administered 2012-08-25 – 2012-08-26 (×4): 250 mL via INTRAVENOUS
  Filled 2012-08-25: qty 500

## 2012-08-25 MED ORDER — ACETAMINOPHEN 10 MG/ML IV SOLN
1000.0000 mg | Freq: Once | INTRAVENOUS | Status: AC
  Administered 2012-08-25: 1000 mg via INTRAVENOUS
  Filled 2012-08-25: qty 100

## 2012-08-25 MED ORDER — MIDAZOLAM HCL 2 MG/2ML IJ SOLN: 2.0000 mg | INTRAMUSCULAR | Status: DC | PRN

## 2012-08-25 MED ORDER — FENTANYL CITRATE 0.05 MG/ML IJ SOLN
INTRAMUSCULAR | Status: AC
  Filled 2012-08-25: qty 2

## 2012-08-25 MED ORDER — DEXMEDETOMIDINE HCL IN NACL 200 MCG/50ML IV SOLN
0.1000 ug/kg/h | INTRAVENOUS | Status: DC
  Filled 2012-08-25 (×3): qty 50

## 2012-08-25 MED ORDER — VECURONIUM BROMIDE 10 MG IV SOLR
INTRAVENOUS | Status: DC | PRN
  Administered 2012-08-25 (×2): 5 mg via INTRAVENOUS
  Administered 2012-08-25: 10 mg via INTRAVENOUS

## 2012-08-25 MED ORDER — 0.9 % SODIUM CHLORIDE (POUR BTL) OPTIME
TOPICAL | Status: DC | PRN
  Administered 2012-08-25: 1000 mL

## 2012-08-25 MED ORDER — MIDAZOLAM HCL 2 MG/2ML IJ SOLN
INTRAMUSCULAR | Status: AC
  Filled 2012-08-25: qty 2

## 2012-08-25 MED ORDER — ASPIRIN 81 MG PO CHEW
324.0000 mg | CHEWABLE_TABLET | Freq: Every day | ORAL | Status: DC
  Administered 2012-08-26: 324 mg
  Filled 2012-08-25: qty 3

## 2012-08-25 MED ORDER — BISACODYL 10 MG RE SUPP: 10.0000 mg | Freq: Every day | RECTAL | Status: DC

## 2012-08-25 MED ORDER — ATORVASTATIN CALCIUM 80 MG PO TABS
80.0000 mg | ORAL_TABLET | Freq: Every day | ORAL | Status: DC
  Administered 2012-08-26 – 2012-08-30 (×5): 80 mg via ORAL
  Filled 2012-08-25 (×6): qty 1

## 2012-08-25 MED ORDER — PHENYLEPHRINE HCL 10 MG/ML IJ SOLN
0.0000 ug/min | INTRAVENOUS | Status: DC
  Filled 2012-08-25: qty 2

## 2012-08-25 MED ORDER — DEXTROSE 5 % IV SOLN
1.5000 g | Freq: Two times a day (BID) | INTRAVENOUS | Status: DC
  Filled 2012-08-25 (×2): qty 1.5

## 2012-08-25 MED ORDER — HEMOSTATIC AGENTS (NO CHARGE) OPTIME
TOPICAL | Status: DC | PRN
  Administered 2012-08-25: 1 via TOPICAL

## 2012-08-25 MED ORDER — SODIUM CHLORIDE 0.9 % IV SOLN
INTRAVENOUS | Status: DC
  Administered 2012-08-26: 20 mL/h via INTRAVENOUS

## 2012-08-25 MED ORDER — ROCURONIUM BROMIDE 100 MG/10ML IV SOLN
INTRAVENOUS | Status: DC | PRN
  Administered 2012-08-25 (×4): 50 mg via INTRAVENOUS

## 2012-08-25 MED ORDER — LACTATED RINGERS IV SOLN: 500.0000 mL | Freq: Once | INTRAVENOUS | Status: AC | PRN

## 2012-08-25 MED ORDER — MORPHINE SULFATE 2 MG/ML IJ SOLN
2.0000 mg | INTRAMUSCULAR | Status: DC | PRN
  Administered 2012-08-25 (×3): 2 mg via INTRAVENOUS
  Administered 2012-08-25: 4 mg via INTRAVENOUS
  Administered 2012-08-26 (×2): 2 mg via INTRAVENOUS
  Administered 2012-08-26: 5 mg via INTRAVENOUS
  Administered 2012-08-26: 1 mg via INTRAVENOUS
  Administered 2012-08-26 (×2): 2 mg via INTRAVENOUS
  Administered 2012-08-27: 4 mg via INTRAVENOUS
  Filled 2012-08-25 (×6): qty 1
  Filled 2012-08-25 (×2): qty 2

## 2012-08-25 MED ORDER — METOPROLOL TARTRATE 12.5 MG HALF TABLET
12.5000 mg | ORAL_TABLET | Freq: Two times a day (BID) | ORAL | Status: DC
  Administered 2012-08-26 – 2012-08-31 (×10): 12.5 mg via ORAL
  Filled 2012-08-25 (×13): qty 1

## 2012-08-25 MED ORDER — POTASSIUM CHLORIDE 10 MEQ/50ML IV SOLN: 10.0000 meq | INTRAVENOUS | Status: AC

## 2012-08-25 MED ORDER — SODIUM CHLORIDE 0.9 % IV SOLN: 250.0000 mL | INTRAVENOUS | Status: DC

## 2012-08-25 MED ORDER — VANCOMYCIN HCL IN DEXTROSE 1-5 GM/200ML-% IV SOLN
1000.0000 mg | Freq: Once | INTRAVENOUS | Status: AC
  Administered 2012-08-25: 1000 mg via INTRAVENOUS
  Filled 2012-08-25: qty 200

## 2012-08-25 MED ORDER — LACTATED RINGERS IV SOLN
INTRAVENOUS | Status: DC | PRN
  Administered 2012-08-25: 07:00:00 via INTRAVENOUS

## 2012-08-25 MED ORDER — INSULIN REGULAR BOLUS VIA INFUSION
0.0000 [IU] | Freq: Three times a day (TID) | INTRAVENOUS | Status: DC
  Filled 2012-08-25: qty 10

## 2012-08-25 MED ORDER — DEXTROSE 5 % IV SOLN
1.5000 g | Freq: Two times a day (BID) | INTRAVENOUS | Status: AC
  Administered 2012-08-25 – 2012-08-27 (×4): 1.5 g via INTRAVENOUS
  Filled 2012-08-25 (×4): qty 1.5

## 2012-08-25 MED ORDER — SODIUM CHLORIDE 0.45 % IV SOLN
INTRAVENOUS | Status: DC
  Administered 2012-08-25: 15:00:00 via INTRAVENOUS

## 2012-08-25 MED ORDER — PROPOFOL 10 MG/ML IV BOLUS
INTRAVENOUS | Status: DC | PRN
  Administered 2012-08-25: 75 mg via INTRAVENOUS

## 2012-08-25 MED ORDER — BISACODYL 5 MG PO TBEC
10.0000 mg | DELAYED_RELEASE_TABLET | Freq: Every day | ORAL | Status: DC
  Administered 2012-08-26 – 2012-08-27 (×2): 10 mg via ORAL
  Filled 2012-08-25 (×2): qty 2

## 2012-08-25 SURGICAL SUPPLY — 107 items
ATTRACTOMAT 16X20 MAGNETIC DRP (DRAPES) ×3 IMPLANT
BAG DECANTER FOR FLEXI CONT (MISCELLANEOUS) ×3 IMPLANT
BANDAGE ELASTIC 4 VELCRO ST LF (GAUZE/BANDAGES/DRESSINGS) ×6 IMPLANT
BANDAGE ELASTIC 6 VELCRO ST LF (GAUZE/BANDAGES/DRESSINGS) ×6 IMPLANT
BANDAGE GAUZE ELAST BULKY 4 IN (GAUZE/BANDAGES/DRESSINGS) ×6 IMPLANT
BLADE STERNUM SYSTEM 6 (BLADE) ×3 IMPLANT
BLADE SURG ROTATE 9660 (MISCELLANEOUS) IMPLANT
CANISTER SUCTION 2500CC (MISCELLANEOUS) ×3 IMPLANT
CANN PRFSN .5XCNCT 15X34-48 (MISCELLANEOUS) ×2
CANNULA AORTIC HI-FLOW 6.5M20F (CANNULA) ×3 IMPLANT
CANNULA PRFSN .5XCNCT 15X34-48 (MISCELLANEOUS) ×2 IMPLANT
CANNULA VEN 2 STAGE (MISCELLANEOUS) ×1
CATH CPB KIT GERHARDT (MISCELLANEOUS) ×3 IMPLANT
CATH THORACIC 28FR (CATHETERS) ×3 IMPLANT
CATH THORACIC 36FR (CATHETERS) IMPLANT
CATH THORACIC 36FR RT ANG (CATHETERS) IMPLANT
CLIP TI MEDIUM 24 (CLIP) IMPLANT
CLIP TI WIDE RED SMALL 24 (CLIP) IMPLANT
CLOTH BEACON ORANGE TIMEOUT ST (SAFETY) ×3 IMPLANT
COVER SURGICAL LIGHT HANDLE (MISCELLANEOUS) ×3 IMPLANT
CRADLE DONUT ADULT HEAD (MISCELLANEOUS) ×3 IMPLANT
DRAIN CHANNEL 28F RND 3/8 FF (WOUND CARE) ×3 IMPLANT
DRAPE CARDIOVASCULAR INCISE (DRAPES) ×1
DRAPE SLUSH/WARMER DISC (DRAPES) ×3 IMPLANT
DRAPE SRG 135X102X78XABS (DRAPES) ×2 IMPLANT
DRSG COVADERM 4X14 (GAUZE/BANDAGES/DRESSINGS) IMPLANT
ELECT BLADE 4.0 EZ CLEAN MEGAD (MISCELLANEOUS) ×3
ELECT REM PT RETURN 9FT ADLT (ELECTROSURGICAL) ×6
ELECTRODE BLDE 4.0 EZ CLN MEGD (MISCELLANEOUS) ×2 IMPLANT
ELECTRODE REM PT RTRN 9FT ADLT (ELECTROSURGICAL) ×4 IMPLANT
GLOVE BIO SURGEON STRL SZ 6 (GLOVE) IMPLANT
GLOVE BIO SURGEON STRL SZ 6.5 (GLOVE) ×18 IMPLANT
GLOVE BIO SURGEON STRL SZ7 (GLOVE) ×3 IMPLANT
GLOVE BIO SURGEON STRL SZ7.5 (GLOVE) ×6 IMPLANT
GLOVE BIOGEL PI IND STRL 6 (GLOVE) IMPLANT
GLOVE BIOGEL PI IND STRL 6.5 (GLOVE) ×10 IMPLANT
GLOVE BIOGEL PI IND STRL 7.0 (GLOVE) IMPLANT
GLOVE BIOGEL PI INDICATOR 6 (GLOVE)
GLOVE BIOGEL PI INDICATOR 6.5 (GLOVE) ×5
GLOVE BIOGEL PI INDICATOR 7.0 (GLOVE)
GLOVE SURG SS PI 6.5 STRL IVOR (GLOVE) ×3 IMPLANT
GOWN STRL NON-REIN LRG LVL3 (GOWN DISPOSABLE) ×12 IMPLANT
HEMOSTAT POWDER SURGIFOAM 1G (HEMOSTASIS) ×9 IMPLANT
HEMOSTAT SURGICEL 2X14 (HEMOSTASIS) ×3 IMPLANT
INSERT FOGARTY 61MM (MISCELLANEOUS) IMPLANT
INSERT FOGARTY XLG (MISCELLANEOUS) IMPLANT
KIT BASIN OR (CUSTOM PROCEDURE TRAY) ×3 IMPLANT
KIT ROOM TURNOVER OR (KITS) ×3 IMPLANT
KIT SUCTION CATH 14FR (SUCTIONS) ×6 IMPLANT
KIT VASOVIEW W/TROCAR VH 2000 (KITS) ×3 IMPLANT
LEAD PACING MYOCARDI (MISCELLANEOUS) ×3 IMPLANT
MARKER GRAFT CORONARY BYPASS (MISCELLANEOUS) ×9 IMPLANT
NS IRRIG 1000ML POUR BTL (IV SOLUTION) ×15 IMPLANT
PACK OPEN HEART (CUSTOM PROCEDURE TRAY) ×3 IMPLANT
PAD ARMBOARD 7.5X6 YLW CONV (MISCELLANEOUS) ×6 IMPLANT
PENCIL BUTTON HOLSTER BLD 10FT (ELECTRODE) ×3 IMPLANT
PUNCH AORTIC ROTATE 4.0MM (MISCELLANEOUS) IMPLANT
PUNCH AORTIC ROTATE 4.5MM 8IN (MISCELLANEOUS) ×3 IMPLANT
PUNCH AORTIC ROTATE 5MM 8IN (MISCELLANEOUS) IMPLANT
SET CARDIOPLEGIA MPS 5001102 (MISCELLANEOUS) ×3 IMPLANT
SOLUTION ANTI FOG 6CC (MISCELLANEOUS) IMPLANT
SPONGE GAUZE 4X4 12PLY (GAUZE/BANDAGES/DRESSINGS) ×9 IMPLANT
SPONGE LAP 18X18 X RAY DECT (DISPOSABLE) IMPLANT
SPONGE LAP 4X18 X RAY DECT (DISPOSABLE) IMPLANT
SUT BONE WAX W31G (SUTURE) ×3 IMPLANT
SUT MNCRL AB 4-0 PS2 18 (SUTURE) IMPLANT
SUT PROLENE 3 0 SH DA (SUTURE) IMPLANT
SUT PROLENE 3 0 SH1 36 (SUTURE) ×3 IMPLANT
SUT PROLENE 4 0 RB 1 (SUTURE)
SUT PROLENE 4 0 SH DA (SUTURE) IMPLANT
SUT PROLENE 4 0 TF (SUTURE) ×6 IMPLANT
SUT PROLENE 4-0 RB1 .5 CRCL 36 (SUTURE) IMPLANT
SUT PROLENE 5 0 C 1 36 (SUTURE) IMPLANT
SUT PROLENE 6 0 C 1 30 (SUTURE) IMPLANT
SUT PROLENE 6 0 CC (SUTURE) ×6 IMPLANT
SUT PROLENE 7 0 BV 1 (SUTURE) IMPLANT
SUT PROLENE 7 0 BV1 MDA (SUTURE) ×6 IMPLANT
SUT PROLENE 7.0 RB 3 (SUTURE) IMPLANT
SUT PROLENE 8 0 BV175 6 (SUTURE) ×6 IMPLANT
SUT SILK  1 MH (SUTURE)
SUT SILK 1 MH (SUTURE) IMPLANT
SUT SILK 2 0 SH CR/8 (SUTURE) IMPLANT
SUT SILK 3 0 SH CR/8 (SUTURE) IMPLANT
SUT STEEL 6MS V (SUTURE) ×3 IMPLANT
SUT STEEL STERNAL CCS#1 18IN (SUTURE) IMPLANT
SUT STEEL SZ 6 DBL 3X14 BALL (SUTURE) ×3 IMPLANT
SUT VIC AB 1 CTX 18 (SUTURE) ×6 IMPLANT
SUT VIC AB 1 CTX 36 (SUTURE)
SUT VIC AB 1 CTX36XBRD ANBCTR (SUTURE) IMPLANT
SUT VIC AB 2-0 CT1 27 (SUTURE) ×1
SUT VIC AB 2-0 CT1 TAPERPNT 27 (SUTURE) ×2 IMPLANT
SUT VIC AB 2-0 CTX 27 (SUTURE) IMPLANT
SUT VIC AB 3-0 SH 27 (SUTURE)
SUT VIC AB 3-0 SH 27X BRD (SUTURE) IMPLANT
SUT VIC AB 3-0 X1 27 (SUTURE) ×3 IMPLANT
SUT VICRYL 4-0 PS2 18IN ABS (SUTURE) IMPLANT
SUTURE E-PAK OPEN HEART (SUTURE) ×3 IMPLANT
SYSTEM SAHARA CHEST DRAIN ATS (WOUND CARE) ×3 IMPLANT
TAPE CLOTH SURG 4X10 WHT LF (GAUZE/BANDAGES/DRESSINGS) ×3 IMPLANT
TOWEL OR 17X24 6PK STRL BLUE (TOWEL DISPOSABLE) ×6 IMPLANT
TOWEL OR 17X26 10 PK STRL BLUE (TOWEL DISPOSABLE) ×6 IMPLANT
TRAY FOLEY IC TEMP SENS 14FR (CATHETERS) ×3 IMPLANT
TUBE FEEDING 8FR 16IN STR KANG (MISCELLANEOUS) ×3 IMPLANT
TUBE SUCT INTRACARD DLP 20F (MISCELLANEOUS) ×3 IMPLANT
TUBING INSUFFLATION 10FT LAP (TUBING) ×3 IMPLANT
UNDERPAD 30X30 INCONTINENT (UNDERPADS AND DIAPERS) ×3 IMPLANT
WATER STERILE IRR 1000ML POUR (IV SOLUTION) ×6 IMPLANT

## 2012-08-25 NOTE — Progress Notes (Signed)
Patient ID: Timothy Cervantes, male   DOB: 06-Mar-1941, 72 y.o.   MRN: [id number]                   [address]            [place] [address]          [phone]     Day of Surgery Procedure(s) (LRB): CORONARY ARTERY BYPASS GRAFTING (CABG) (N/A) INTRAOPERATIVE TRANSESOPHAGEAL ECHOCARDIOGRAM (N/A)  Total Length of Stay:  LOS: 2 days  BP 105/60  Pulse 80  Temp(Src) 99.3 F (37.4 C) (Oral)  Resp 18  Ht 6\' 1"  (1.854 m)  Wt 255 lb 8.2 oz (115.9 kg)  BMI 33.72 kg/m2  SpO2 98%  .Intake/Output     03/27 0701 - 03/28 0700 03/28 0701 - 03/29 0700   P.O. 660    I.V. (mL/kg) 2225.8 (19.2) 3108.1 (26.8)   Blood  1263   NG/GT  30   IV Piggyback  1300   Total Intake(mL/kg) 2885.8 (24.9) 5701.1 (49.2)   Urine (mL/kg/hr) 4400 (1.6) 2400 (1.8)   Emesis/NG output  50 (0)   Blood  1500 (1.1)   Chest Tube  85 (0.1)   Total Output 4400 4035   Net -1514.3 +1666.1          . sodium chloride 20 mL/hr at 08/25/12 1515  . sodium chloride 20 mL/hr at 08/25/12 1430  . [START ON 08/26/2012] sodium chloride    . dexmedetomidine Stopped (08/25/12 1600)  . insulin (NOVOLIN-R) infusion 0.8 Units/hr (08/25/12 1800)  . lactated ringers 20 mL/hr at 08/25/12 1600  . nitroGLYCERIN Stopped (08/25/12 1446)  . phenylephrine (NEO-SYNEPHRINE) Adult infusion Stopped (08/25/12 1833)     Lab Results  Component Value Date   WBC 11.2* 08/25/2012   HGB 12.9* 08/25/2012   HCT 38.0* 08/25/2012   PLT 117* 08/25/2012   GLUCOSE 120* 08/25/2012   CHOL 159 08/24/2012   TRIG 108 08/24/2012   HDL 49 08/24/2012   LDLCALC 88 08/24/2012   ALT 49 08/24/2012   AST 61* 08/24/2012   NA 140 08/25/2012   K 4.1 08/25/2012   CL 105 08/25/2012   CREATININE 0.88 08/25/2012   BUN 15 08/25/2012   CO2 23 08/25/2012   INR 1.29 08/25/2012   HGBA1C 7.4* 08/24/2012   Waking up, abg good, plan extubation Not bleeding   Delight Ovens MD  Beeper [phone] Office [phone] 08/25/2012 6:43 PM

## 2012-08-25 NOTE — Anesthesia Preprocedure Evaluation (Addendum)
Anesthesia Evaluation  Patient identified by MRN, date of birth, ID band Patient awake    Reviewed: Allergy & Precautions, H&P , NPO status , Patient's Chart, lab work & pertinent test results, reviewed documented beta blocker date and time   Airway Mallampati: II TM Distance: >3 FB Neck ROM: Full    Dental  (+) Teeth Intact and Dental Advisory Given   Pulmonary  breath sounds clear to auscultation        Cardiovascular Rhythm:Regular Rate:Normal     Neuro/Psych    GI/Hepatic   Endo/Other    Renal/GU      Musculoskeletal   Abdominal   Peds  Hematology   Anesthesia Other Findings   Reproductive/Obstetrics                           Anesthesia Physical Anesthesia Plan  ASA: IV  Anesthesia Plan: General   Post-op Pain Management:    Induction: Intravenous  Airway Management Planned: Oral ETT  Additional Equipment: Arterial line, PA Cath, CVP and TEE  Intra-op Plan:   Post-operative Plan: Post-operative intubation/ventilation  Informed Consent:   Dental advisory given  Plan Discussed with: CRNA and Surgeon  Anesthesia Plan Comments: (3 V CAD admitted with non-stemi and preserved LV function Htn Smoker Type 2 DM glucose 136 Fibromyalgia  Plan GA   Kipp Brood, MD)       Anesthesia Quick Evaluation

## 2012-08-25 NOTE — Progress Notes (Signed)
  Echocardiogram Echocardiogram Transesophageal has been performed.  Timothy Cervantes, Timothy Cervantes 08/25/2012, 8:28 AM

## 2012-08-25 NOTE — Preoperative (Signed)
Beta Blockers   Reason not to administer Beta Blockers:Not Applicable 

## 2012-08-25 NOTE — Anesthesia Postprocedure Evaluation (Signed)
  Anesthesia Post-op Note  Patient: Timothy Cervantes  Procedure(s) Performed: Procedure(s) with comments: CORONARY ARTERY BYPASS GRAFTING (CABG) (N/A) - Coronary artery bypass graft times five using left internal mammary artery and bilateral saphenous leg vein using endoscope. INTRAOPERATIVE TRANSESOPHAGEAL ECHOCARDIOGRAM (N/A)  Patient Location: SICU  Anesthesia Type:General  Level of Consciousness: sedated and Patient remains intubated per anesthesia plan  Airway and Oxygen Therapy: Patient remains intubated per anesthesia plan and Patient placed on Ventilator (see vital sign flow sheet for setting)  Post-op Pain: none  Post-op Assessment: Post-op Vital signs reviewed and Patient's Cardiovascular Status Stable  Post-op Vital Signs: stable  Complications: No apparent anesthesia complications

## 2012-08-25 NOTE — Anesthesia Procedure Notes (Signed)
Procedure Name: Intubation Date/Time: 08/25/2012 7:40 AM Performed by: Gayla Medicus Pre-anesthesia Checklist: Patient identified, Timeout performed, Emergency Drugs available, Suction available and Patient being monitored Patient Re-evaluated:Patient Re-evaluated prior to inductionOxygen Delivery Method: Circle system utilized Preoxygenation: Pre-oxygenation with 100% oxygen Intubation Type: IV induction Ventilation: Two handed mask ventilation required and Oral airway inserted - appropriate to patient size Laryngoscope Size: Mac and 4 Grade View: Grade III Tube type: Oral Tube size: 8.0 mm Number of attempts: 1 Airway Equipment and Method: Stylet Placement Confirmation: ETT inserted through vocal cords under direct vision,  positive ETCO2 and breath sounds checked- equal and bilateral Secured at: 22 cm Tube secured with: Tape Dental Injury: Teeth and Oropharynx as per pre-operative assessment

## 2012-08-25 NOTE — Transfer of Care (Signed)
Immediate Anesthesia Transfer of Care Note  Patient: Timothy Cervantes  Procedure(s) Performed: Procedure(s) with comments: CORONARY ARTERY BYPASS GRAFTING (CABG) (N/A) - Coronary artery bypass graft times five using left internal mammary artery and bilateral saphenous leg vein using endoscope. INTRAOPERATIVE TRANSESOPHAGEAL ECHOCARDIOGRAM (N/A)  Patient Location: SICU  Anesthesia Type:General  Level of Consciousness: sedated and Patient remains intubated per anesthesia plan  Airway & Oxygen Therapy: Patient remains intubated per anesthesia plan and Patient placed on Ventilator (see vital sign flow sheet for setting)  Post-op Assessment: Report given to PACU RN and Post -op Vital signs reviewed and stable  Post vital signs: Reviewed and stable  Complications: No apparent anesthesia complications

## 2012-08-25 NOTE — Procedures (Signed)
Extubation Procedure Note  Patient Details:   Name: Timothy Cervantes DOB: May 28, 1941 MRN: [id number]   Airway Documentation:     Evaluation  O2 sats: stable throughout Complications: No apparent complications Patient did tolerate procedure well. Bilateral Breath Sounds: Clear   Yes, PT extubated to a 4lpm, PT able to vocalize. Will continue to monitor pt progress.   Melanee Spry 08/25/2012, 6:51 PM

## 2012-08-25 NOTE — Brief Op Note (Addendum)
08/23/2012 - 08/25/2012  12:07 PM  PATIENT:  Timothy Cervantes  72 y.o. male  PRE-OPERATIVE DIAGNOSIS:  CAD with recent MI  POST-OPERATIVE DIAGNOSIS:  CAD, with evidence of transmural inferior MI (see photo in paper chart)  PROCEDURE:  Procedure(s): CORONARY ARTERY BYPASS GRAFTING (CABG)X 5 LIMA-LAD; SEQ SVG-OM-CX; SVG-DIG; SVG-RCA INTRAOPERATIVE TRANSESOPHAGEAL ECHOCARDIOGRAM [facility] RIGHT LEG AND LEFT THIGH  SURGEON:  Surgeon(s): Delight Ovens, MD  PHYSICIAN ASSISTANT: Nitesh GOLD PA-C  ANESTHESIA:   general  PATIENT CONDITION:  ICU - intubated and hemodynamically stable.  PRE-OPERATIVE WEIGHT: 115.9kg  COMPLICATIONS: NO KNOWN

## 2012-08-25 NOTE — CV Procedure (Signed)
Intraoperative Transesophageal Echocardiography Note:  Mr. Timothy Cervantes is a 72 year old male with a history of diabetes, hypertension, and a long smoking history who was admitted to [facility] with complaints of chest and jaw pain. He subsequently was diagnosed with a non-STEMI. Cardiac catheterization revealed severe three-vessel coronary disease with preserved left ventricular function. He is now scheduled to undergo coronary artery bypass grafting by Dr. Tyrone Sage. Intraoperative transesophageal echocardiography was requested to evaluate the right and left ventricular function, to assess for any valvular pathology, and to assist with any de-airing procedures.  The patient was brought to the operating room at [facility] and general anesthesia was induced without difficulty. Following endotracheal intubation and orogastric suctioning, the transesophageal echocardiography probe was inserted into the esophagus without difficulty.  Impression: Pre-bypass findings:  1. Aortic valve: The aortic valve was trileaflet. The leaflets were mildly thickened but opened normally and there was no aortic insufficiency.  2. Mitral valve: The mitral leaflets coapted well without prolapse or fluttering. There was trace mitral insufficiency.   3. Left ventricle: There was moderate to severe concentric left ventricular hypertrophy. Left ventricular wall thickness measured 1.25-1.3 cm at end diastole at the mid-papillary level in the transgastric short axis view. Left ventricular end-diastolic diameter measured 44 mm at the mid-papillary level view at end diastole. There appeared to be normal left ventricular systolic function with no regional wall motion at about is that could be appreciated. Left ventricular end ejection fraction was estimated at 55%. There was no thrombus noted in the left ventricular apex.  4. Right ventricle: The right  ventricular cavity appeared to be within normal limits of  size. There appeared to be adequate contractility of the right ventricular free wall  5. Tricuspid valve: The tricuspid valve appeared structurally unremarkable and there was trace tricuspid insufficiency.  6. Interatrial septum: The interatrial septum was intact without evidence of patent foramen ovale or atrial septal defect by color Doppler and bubble study  7. Left atrium: There was no thrombus noted in the left atrium or left atrial appendage  8. Ascending aorta: There was some thickening noted of the in the wall of the ascending aorta and there was some calcification noted in the region of the right sinus of Valsalva. There were no mobile or protruding atheromatous plaques appreciated.   9. Descending aorta: There was mild atheromatous disease in the descending aorta. The descending aorta measured 2.6 cm in diameter  Post-bypass findings:  1. Aortic valve: The aortic valve appeared unchanged from the pre-bypass study. The leaflets opened normally and there was no aortic insufficiency.  2. Mitral valve: The mitral valve opened normally and there was trace mitral insufficiency. There were no prolapsing or flail segments appreciated  3. Left ventricle: There appeared to be good left ventricular systolic function. There were no regional wall motion abnormalities and the ejection fraction was estimated at 55-60%  4. Right ventricle: Upon initial separation from cardiopulmonary bypass there appeared to be some decreased contractility of the right ventricular free wall. However over the next 10-15 minutes following separation from cardiopulmonary bypass, the right ventricular function appeared to improve and there appeared to be normal contractility the right ventricular free wall when the transesophageal echocardiography probe was removed at the end of surgery.  Kipp Brood, M.D.

## 2012-08-26 ENCOUNTER — Inpatient Hospital Stay (HOSPITAL_COMMUNITY): Payer: Medicare Other

## 2012-08-26 DIAGNOSIS — R079 Chest pain, unspecified: Secondary | ICD-10-CM

## 2012-08-26 LAB — GLUCOSE, CAPILLARY
Glucose-Capillary: 93 mg/dL (ref 70–99)
Glucose-Capillary: 88 mg/dL (ref 70–99)
Glucose-Capillary: 95 mg/dL (ref 70–99)
Glucose-Capillary: 134 mg/dL — ABNORMAL HIGH (ref 70–99)
Glucose-Capillary: 111 mg/dL — ABNORMAL HIGH (ref 70–99)
Glucose-Capillary: 104 mg/dL — ABNORMAL HIGH (ref 70–99)
Glucose-Capillary: 107 mg/dL — ABNORMAL HIGH (ref 70–99)
Glucose-Capillary: 133 mg/dL — ABNORMAL HIGH (ref 70–99)

## 2012-08-26 LAB — CREATININE, SERUM
GFR calc Af Amer: >90 mL/min (ref >90)
GFR calc non Af Amer: 83 mL/min — ABNORMAL LOW (ref >90)

## 2012-08-26 LAB — CBC
HCT: 33 % — ABNORMAL LOW (ref 39.0–52.0)
Hemoglobin: 11.5 g/dL — ABNORMAL LOW (ref 13.0–17.0)
Hemoglobin: 11.4 g/dL — ABNORMAL LOW (ref 13.0–17.0)
MCH: 30.2 pg (ref 26.0–34.0)
MCV: 89 fL (ref 78.0–100.0)
MCV: 88.2 fL (ref 78.0–100.0)
Platelets: 100 10*3/uL — ABNORMAL LOW (ref 150–400)
RBC: 3.81 MIL/uL — ABNORMAL LOW (ref 4.22–5.81)
RBC: 3.74 MIL/uL — ABNORMAL LOW (ref 4.22–5.81)
WBC: 11.5 10*3/uL — ABNORMAL HIGH (ref 4.0–10.5)

## 2012-08-26 LAB — BASIC METABOLIC PANEL
CO2: 23 mEq/L (ref 19–32)
Calcium: 7.7 mg/dL — ABNORMAL LOW (ref 8.4–10.5)
Creatinine, Ser: 0.89 mg/dL (ref 0.50–1.35)
Glucose, Bld: 122 mg/dL — ABNORMAL HIGH (ref 70–99)

## 2012-08-26 LAB — POCT I-STAT, CHEM 8
Glucose, Bld: 135 mg/dL — ABNORMAL HIGH (ref 70–99)
HCT: 34 % — ABNORMAL LOW (ref 39.0–52.0)
Hemoglobin: 11.6 g/dL — ABNORMAL LOW (ref 13.0–17.0)
Potassium: 4.2 mEq/L (ref 3.5–5.1)
Sodium: 135 mEq/L (ref 135–145)
TCO2: 23 mmol/L (ref 0–100)

## 2012-08-26 MED ORDER — BUPRENORPHINE 10 MCG/HR TD PTWK
10.0000 ug | MEDICATED_PATCH | TRANSDERMAL | Status: DC
  Administered 2012-08-26: 10 ug via TRANSDERMAL

## 2012-08-26 MED ORDER — KETOROLAC TROMETHAMINE 15 MG/ML IJ SOLN
15.0000 mg | Freq: Three times a day (TID) | INTRAMUSCULAR | Status: AC
  Administered 2012-08-26 (×3): 15 mg via INTRAVENOUS
  Filled 2012-08-26 (×3): qty 1

## 2012-08-26 MED ORDER — INSULIN ASPART 100 UNIT/ML ~~LOC~~ SOLN
0.0000 [IU] | SUBCUTANEOUS | Status: DC
  Administered 2012-08-26 – 2012-08-27 (×6): 2 [IU] via SUBCUTANEOUS
  Administered 2012-08-27: 4 [IU] via SUBCUTANEOUS

## 2012-08-26 MED ORDER — CHLORHEXIDINE GLUCONATE 0.12 % MT SOLN
OROMUCOSAL | Status: AC
  Administered 2012-08-26: 15 mL via ORAL
  Filled 2012-08-26: qty 15

## 2012-08-26 MED ORDER — BUPROPION HCL ER (XL) 300 MG PO TB24: 300.0000 mg | ORAL_TABLET | Freq: Every day | ORAL | Status: DC

## 2012-08-26 MED ORDER — INSULIN DETEMIR 100 UNIT/ML ~~LOC~~ SOLN
15.0000 [IU] | Freq: Every day | SUBCUTANEOUS | Status: DC
  Administered 2012-08-26 – 2012-08-28 (×3): 15 [IU] via SUBCUTANEOUS
  Filled 2012-08-26 (×4): qty 0.15

## 2012-08-26 MED ORDER — ENOXAPARIN SODIUM 30 MG/0.3ML ~~LOC~~ SOLN
30.0000 mg | Freq: Every day | SUBCUTANEOUS | Status: DC
  Administered 2012-08-27 – 2012-08-30 (×5): 30 mg via SUBCUTANEOUS
  Filled 2012-08-26 (×6): qty 0.3

## 2012-08-26 MED ORDER — TRAZODONE HCL 100 MG PO TABS
100.0000 mg | ORAL_TABLET | Freq: Every evening | ORAL | Status: DC | PRN
  Administered 2012-08-27 – 2012-08-29 (×2): 100 mg via ORAL
  Filled 2012-08-26 (×4): qty 1

## 2012-08-26 NOTE — Progress Notes (Signed)
Patient ID: Timothy Cervantes, male   DOB: August 15, 1940, 72 y.o.   MRN: [id number] TCTS DAILY PROGRESS NOTE                   [address]            [place] [address]          [phone]      1 Day Post-Op Procedure(s) (LRB): CORONARY ARTERY BYPASS GRAFTING (CABG) (N/A) INTRAOPERATIVE TRANSESOPHAGEAL ECHOCARDIOGRAM (N/A)  Total Length of Stay:  LOS: 3 days   Subjective: Complains of back pain, no incision pain, history of chronic back pain and on numerous "pain" medications preop  Objective: Vital signs in last 24 hours: Temp:  [97.7 F (36.5 C)-100.8 F (38.2 C)] 99.9 F (37.7 C) (03/29 0730) Pulse Rate:  [79-90] 80 (03/29 0730) Cardiac Rhythm:  [-] Normal sinus rhythm;Atrial paced (03/28 2000) Resp:  [12-27] 23 (03/29 0730) BP: (81-117)/(47-72) 86/49 mmHg (03/29 0700) SpO2:  [95 %-100 %] 99 % (03/29 0730) Arterial Line BP: (89-136)/(45-78) 110/54 mmHg (03/29 0730) FiO2 (%):  [40 %-50 %] 40 % (03/28 1802) Weight:  [255 lb 8.2 oz (115.9 kg)] 255 lb 8.2 oz (115.9 kg) (03/29 0545)  Filed Weights   08/24/12 0400 08/25/12 0400 08/26/12 0545  Weight: 255 lb 15.3 oz (116.1 kg) 255 lb 8.2 oz (115.9 kg) 255 lb 8.2 oz (115.9 kg)    Weight change: 0 lb (0 kg)   Hemodynamic parameters for last 24 hours: PAP: (20-43)/(9-25) 34/21 mmHg CO:  [4.7 L/min-7.4 L/min] 7.4 L/min CI:  [2 L/min/m2-3.1 L/min/m2] 3.1 L/min/m2  Intake/Output from previous day: 03/28 0701 - 03/29 0700 In: 7046.8 [I.V.:3753.8; UXLKG:4010; NG/GT:30; IV Piggyback:2000] Out: 5310 [Urine:3325; Emesis/NG output:50; Blood:1500; Chest Tube:435]  Intake/Output this shift:    Current Meds: Scheduled Meds: . acetaminophen  1,000 mg Oral Q6H   Or  . acetaminophen (TYLENOL) oral liquid 160 mg/5 mL  975 mg Per Tube Q6H  . aspirin EC  325 mg Oral Daily   Or  . aspirin  324 mg Per Tube Daily  . atorvastatin  80 mg Oral q1800  . bisacodyl  10 mg Oral Daily   Or  . bisacodyl  10 mg Rectal Daily    . buPROPion  300 mg Oral Daily  . cefUROXime (ZINACEF)  IV  1.5 g Intravenous Q12H  . docusate sodium  200 mg Oral Daily  . famotidine (PEPCID) IV  20 mg Intravenous Q12H  . insulin regular  0-10 Units Intravenous TID WC  . metoprolol tartrate  12.5 mg Oral BID   Or  . metoprolol tartrate  12.5 mg Per Tube BID  . [START ON 08/27/2012] pantoprazole  40 mg Oral Daily  . sodium chloride  3 mL Intravenous Q12H   Continuous Infusions: . sodium chloride 20 mL/hr at 08/25/12 1515  . sodium chloride 20 mL/hr at 08/25/12 1430  . sodium chloride    . dexmedetomidine Stopped (08/25/12 1600)  . insulin (NOVOLIN-R) infusion 1.5 Units/hr (08/25/12 1900)  . lactated ringers 20 mL/hr at 08/25/12 1600  . nitroGLYCERIN Stopped (08/25/12 1446)  . phenylephrine (NEO-SYNEPHRINE) Adult infusion Stopped (08/25/12 1833)   PRN Meds:.metoprolol, midazolam, morphine injection, ondansetron (ZOFRAN) IV, oxyCODONE, sodium chloride  General appearance: alert, cooperative and moderate distress Neurologic: intact Heart: regular rate and rhythm, S1, S2 normal, no murmur, click, rub or gallop and normal apical impulse Lungs: diminished breath sounds bibasilar Abdomen: soft, non-tender; bowel sounds normal; no masses,  no  organomegaly Extremities: extremities normal, atraumatic, no cyanosis or edema and Homans sign is negative, no sign of DVT Wound: sternum stable, apaced  Lab Results: CBC: Recent Labs  08/25/12 2115 08/26/12 0400  WBC 9.5 9.1  HGB 12.3* 11.5*  HCT 35.9* 33.9*  PLT 120* 100*   BMET:  Recent Labs  08/25/12 0400  08/25/12 2105 08/25/12 2115 08/26/12 0400  NA 137  < > 139  --  137  K 4.3  < > 4.3  --  4.1  CL 105  --  107  --  106  CO2 23  --   --   --  23  GLUCOSE 136*  < > 137*  --  122*  BUN 15  --  17  --  20  CREATININE 0.88  --  0.90 0.83 0.89  CALCIUM 8.7  --   --   --  7.7*  < > = values in this interval not displayed.  PT/INR:  Recent Labs  08/25/12 1400  LABPROT  15.8*  INR 1.29   Radiology: Dg Chest 2 View  08/24/2012  *RADIOLOGY REPORT*  Clinical Data: 72 year old male preoperative study for CABG.  CHEST - 2 VIEW  Comparison: 08/23/2012 and earlier.  Findings: Increased pulmonary vascularity/vascular congestion. Cardiac size and mediastinal contours are within normal limits. Visualized tracheal air column is within normal limits.  No pneumothorax or pleural effusion.  No confluent pulmonary opacity. No acute osseous abnormality identified.  IMPRESSION: Increased vascular congestion or mild interstitial edema.  No other acute cardiopulmonary abnormality.   Original Report Authenticated By: Erskine Speed, M.D.    Dg Chest Portable 1 View In Am  08/26/2012  *RADIOLOGY REPORT*  Clinical Data: Postoperative film.  PORTABLE CHEST - 1 VIEW  Comparison: Chest x-ray 08/25/2012.  Findings: Previously noted nasogastric and endotracheal tubes have been removed.  Right IJ central venous Cordis through which a Theone Murdoch catheter has been passed and the pulmonic trunk.  Left-sided chest tube remains in position with tip and side port projecting over the left hemithorax.  A mediastinal drain is again noted. Epicardial pacing wires are noted.  Lung volumes are low.  There is some bibasilar opacities compatible with very mild subsegmental atelectasis.  No consolidative airspace disease.  No pleural effusions.  Crowding of the pulmonary vasculature, accentuated by low lung volumes, without frank pulmonary edema.  Cardiopericardial silhouette appears borderline enlarged. The patient is rotated to the left on today's exam, resulting in distortion of the mediastinal contours and reduced diagnostic sensitivity and specificity for mediastinal pathology.  Atherosclerosis in the thoracic aorta.  Status post median sternotomy for CABG.  IMPRESSION: 1.  Support apparatus, as above. 2.  Allowing for slight differences in patient positioning, the radiographic appearance of the chest is otherwise  essentially unchanged, as above.   Original Report Authenticated By: Trudie Reed, M.D.    Dg Chest Portable 1 View  08/25/2012  *RADIOLOGY REPORT*  Clinical Data: Status post coronary bypass grafting  PORTABLE CHEST - 1 VIEW  Comparison: 08/24/2012  Findings: Postsurgical changes are now seen.  An endotracheal tube, nasogastric catheter, mediastinal drain, left thoracostomy catheters Swan-Ganz catheter are all noted in satisfactory position.  No pneumothorax is seen.  No focal infiltrate is noted.  IMPRESSION: Tubes and lines as described above.  No acute abnormality is noted.   Original Report Authenticated By: Alcide Clever, M.D.      Assessment/Plan: S/P Procedure(s) (LRB): CORONARY ARTERY BYPASS GRAFTING (CABG) (N/A) INTRAOPERATIVE TRANSESOPHAGEAL ECHOCARDIOGRAM (N/A) Mobilize  Diuresis Diabetes control Continue foley due to strict I&O, patient in ICU and urinary output monitoring See progression orders adjust pain control Expected Acute  Blood - loss Anemia      Hindy Perrault B 08/26/2012 9:08 AM

## 2012-08-26 NOTE — Progress Notes (Signed)
Patient ID: Timothy Cervantes, male   DOB: 03-29-1941, 72 y.o.   MRN: [id number]                   [address]            [place] [address]          [phone]     1 Day Post-Op Procedure(s) (LRB): CORONARY ARTERY BYPASS GRAFTING (CABG) (N/A) INTRAOPERATIVE TRANSESOPHAGEAL ECHOCARDIOGRAM (N/A)  Total Length of Stay:  LOS: 3 days  BP 108/53  Pulse 87  Temp(Src) 98.7 F (37.1 C) (Oral)  Resp 30  Ht 6\' 1"  (1.854 m)  Wt 255 lb 8.2 oz (115.9 kg)  BMI 33.72 kg/m2  SpO2 97%  .Intake/Output     03/29 0701 - 03/30 0700   I.V. (mL/kg) 225.1 (1.9)   Blood    NG/GT    IV Piggyback 100   Total Intake(mL/kg) 325.1 (2.8)   Urine (mL/kg/hr) 640 (0.4)   Emesis/NG output    Blood    Chest Tube 100 (0.1)   Total Output 740   Net -414.9         . sodium chloride 20 mL/hr at 08/25/12 1515  . sodium chloride 20 mL/hr at 08/26/12 1900  . sodium chloride    . lactated ringers 20 mL/hr at 08/26/12 1600  . nitroGLYCERIN Stopped (08/25/12 1446)  . phenylephrine (NEO-SYNEPHRINE) Adult infusion Stopped (08/25/12 1833)     Lab Results  Component Value Date   WBC 11.5* 08/26/2012   HGB 11.6* 08/26/2012   HCT 34.0* 08/26/2012   PLT 122* 08/26/2012   GLUCOSE 135* 08/26/2012   CHOL 159 08/24/2012   TRIG 108 08/24/2012   HDL 49 08/24/2012   LDLCALC 88 08/24/2012   ALT 49 08/24/2012   AST 61* 08/24/2012   NA 135 08/26/2012   K 4.2 08/26/2012   CL 103 08/26/2012   CREATININE 0.90 08/26/2012   BUN 32* 08/26/2012   CO2 23 08/26/2012   INR 1.29 08/25/2012   HGBA1C 7.4* 08/24/2012   Says no pain now, got out of bed with no difficulty   Delight Ovens MD  Beeper [phone] Office [phone] 08/26/2012 7:39 PM

## 2012-08-27 ENCOUNTER — Inpatient Hospital Stay (HOSPITAL_COMMUNITY): Payer: Medicare Other

## 2012-08-27 LAB — BASIC METABOLIC PANEL
BUN: 31 mg/dL — ABNORMAL HIGH (ref 6–23)
CO2: 21 mEq/L (ref 19–32)
Calcium: 7.8 mg/dL — ABNORMAL LOW (ref 8.4–10.5)
Chloride: 98 mEq/L (ref 96–112)
Creatinine, Ser: 0.85 mg/dL (ref 0.50–1.35)
GFR calc Af Amer: >90 mL/min (ref >90)
GFR calc non Af Amer: 87 mL/min — ABNORMAL LOW (ref >90)
Glucose, Bld: 123 mg/dL — ABNORMAL HIGH (ref 70–99)
Potassium: 3.9 mEq/L (ref 3.5–5.1)
Sodium: 130 mEq/L — ABNORMAL LOW (ref 135–145)

## 2012-08-27 LAB — CBC
HCT: 31.3 % — ABNORMAL LOW (ref 39.0–52.0)
Hemoglobin: 10.7 g/dL — ABNORMAL LOW (ref 13.0–17.0)
MCH: 30.4 pg (ref 26.0–34.0)
MCHC: 34.2 g/dL (ref 30.0–36.0)
MCV: 88.9 fL (ref 78.0–100.0)
Platelets: 120 10*3/uL — ABNORMAL LOW (ref 150–400)
RBC: 3.52 MIL/uL — ABNORMAL LOW (ref 4.22–5.81)
RDW: 13.8 % (ref 11.5–15.5)
WBC: 8.2 10*3/uL (ref 4.0–10.5)

## 2012-08-27 LAB — GLUCOSE, CAPILLARY
Glucose-Capillary: 118 mg/dL — ABNORMAL HIGH (ref 70–99)
Glucose-Capillary: 125 mg/dL — ABNORMAL HIGH (ref 70–99)

## 2012-08-27 MED ORDER — FUROSEMIDE 10 MG/ML IJ SOLN
INTRAMUSCULAR | Status: AC
  Filled 2012-08-27: qty 4

## 2012-08-27 MED ORDER — VILAZODONE HCL 40 MG PO TABS
40.0000 mg | ORAL_TABLET | Freq: Every day | ORAL | Status: DC
Start: 2012-08-27 — End: 2012-08-31
  Administered 2012-08-27 – 2012-08-31 (×5): 40 mg via ORAL
  Filled 2012-08-27 (×5): qty 1

## 2012-08-27 MED ORDER — FUROSEMIDE 10 MG/ML IJ SOLN
40.0000 mg | Freq: Once | INTRAMUSCULAR | Status: AC
  Administered 2012-08-27: 40 mg via INTRAVENOUS

## 2012-08-27 NOTE — Progress Notes (Signed)
Patient ID: Timothy Cervantes, male   DOB: [birth year], 72 y.o.   MRN: [id number]                   [address]            [place] [address]          [phone]     2 Days Post-Op Procedure(s) (LRB): CORONARY ARTERY BYPASS GRAFTING (CABG) (N/A) INTRAOPERATIVE TRANSESOPHAGEAL ECHOCARDIOGRAM (N/A)  Total Length of Stay:  LOS: 4 days  BP 108/60  Pulse 91  Temp(Src) 98.5 F (36.9 C) (Oral)  Resp 23  Ht 6\' 1"  (1.854 m)  Wt 255 lb 8.2 oz (115.9 kg)  BMI 33.72 kg/m2  SpO2 95%  .Intake/Output     03/30 0701 - 03/31 0700   P.O. 1080   I.V. (mL/kg)    IV Piggyback    Total Intake(mL/kg) 1080 (9.3)   Urine (mL/kg/hr) 1315 (0.9)   Chest Tube    Total Output 1315   Net -235         . sodium chloride 20 mL/hr at 08/25/12 1515  . sodium chloride 20 mL/hr at 08/27/12 0800  . sodium chloride    . lactated ringers 20 mL/hr at 08/26/12 1600  . nitroGLYCERIN Stopped (08/25/12 1446)  . phenylephrine (NEO-SYNEPHRINE) Adult infusion Stopped (08/25/12 1833)     Lab Results  Component Value Date   WBC 8.2 08/27/2012   HGB 10.7* 08/27/2012   HCT 31.3* 08/27/2012   PLT 120* 08/27/2012   GLUCOSE 123* 08/27/2012   CHOL 159 08/24/2012   TRIG 108 08/24/2012   HDL 49 08/24/2012   LDLCALC 88 08/24/2012   ALT 49 08/24/2012   AST 61* 08/24/2012   NA 130* 08/27/2012   K 3.9 08/27/2012   CL 98 08/27/2012   CREATININE 0.85 08/27/2012   BUN 31* 08/27/2012   CO2 21 08/27/2012   INR 1.29 08/25/2012   HGBA1C 7.4* 08/24/2012   Stable day, pain control adquate  Delight Ovens MD  Beeper [phone] Office [phone] 08/27/2012 7:30 PM

## 2012-08-27 NOTE — Progress Notes (Signed)
Patient ID: Timothy Cervantes, male   DOB: June 03, 1940, 72 y.o.   MRN: [id number] TCTS DAILY PROGRESS NOTE                   [address]            [place] [address]          [phone]      2 Days Post-Op Procedure(s) (LRB): CORONARY ARTERY BYPASS GRAFTING (CABG) (N/A) INTRAOPERATIVE TRANSESOPHAGEAL ECHOCARDIOGRAM (N/A)  Total Length of Stay:  LOS: 4 days   Subjective: Up to chair, pain control better, not walked yet  Objective: Vital signs in last 24 hours: Temp:  [98.1 F (36.7 C)-99.4 F (37.4 C)] 99.4 F (37.4 C) (03/30 0800) Pulse Rate:  [80-101] 101 (03/30 0700) Cardiac Rhythm:  [-] Normal sinus rhythm (03/29 2015) Resp:  [17-32] 28 (03/30 0700) BP: (86-110)/(47-80) 105/80 mmHg (03/30 0700) SpO2:  [91 %-99 %] 96 % (03/30 0700) Arterial Line BP: (98-134)/(45-58) 134/58 mmHg (03/29 1800)  Filed Weights   08/24/12 0400 08/25/12 0400 08/26/12 0545  Weight: 255 lb 15.3 oz (116.1 kg) 255 lb 8.2 oz (115.9 kg) 255 lb 8.2 oz (115.9 kg)    Weight change:    Hemodynamic parameters for last 24 hours:    Intake/Output from previous day: 03/29 0701 - 03/30 0700 In: 505.1 [P.O.:120; I.V.:285.1; IV Piggyback:100] Out: 1265 [Urine:1165; Chest Tube:100]  Intake/Output this shift:    Current Meds: Scheduled Meds: . acetaminophen  1,000 mg Oral Q6H   Or  . acetaminophen (TYLENOL) oral liquid 160 mg/5 mL  975 mg Per Tube Q6H  . aspirin EC  325 mg Oral Daily   Or  . aspirin  324 mg Per Tube Daily  . atorvastatin  80 mg Oral q1800  . bisacodyl  10 mg Oral Daily   Or  . bisacodyl  10 mg Rectal Daily  . buprenorphine  10 mcg Transdermal Weekly  . buPROPion  300 mg Oral Daily  . docusate sodium  200 mg Oral Daily  . enoxaparin (LOVENOX) injection  30 mg Subcutaneous QHS  . insulin aspart  0-24 Units Subcutaneous Q4H  . insulin detemir  15 Units Subcutaneous Q1200  . metoprolol tartrate  12.5 mg Oral BID   Or  . metoprolol tartrate  12.5 mg Per Tube  BID  . pantoprazole  40 mg Oral Daily  . sodium chloride  3 mL Intravenous Q12H  . Vilazodone HCl  40 mg Oral Daily   Continuous Infusions: . sodium chloride 20 mL/hr at 08/25/12 1515  . sodium chloride 20 mL/hr at 08/27/12 0600  . sodium chloride    . lactated ringers 20 mL/hr at 08/26/12 1600  . nitroGLYCERIN Stopped (08/25/12 1446)  . phenylephrine (NEO-SYNEPHRINE) Adult infusion Stopped (08/25/12 1833)   PRN Meds:.metoprolol, midazolam, morphine injection, ondansetron (ZOFRAN) IV, oxyCODONE, sodium chloride, traZODone  General appearance: alert, cooperative, appears stated age and no distress Neurologic: intact Heart: regular rate and rhythm, S1, S2 normal, no murmur, click, rub or gallop and normal apical impulse Lungs: diminished breath sounds bibasilar Abdomen: soft, non-tender; bowel sounds normal; no masses,  no organomegaly Extremities: extremities normal, atraumatic, no cyanosis or edema and Homans sign is negative, no sign of DVT Wound: sternum stable  Lab Results: CBC: Recent Labs  08/26/12 1805 08/26/12 1816 08/27/12 0453  WBC 11.5*  --  8.2  HGB 11.4* 11.6* 10.7*  HCT 33.0* 34.0* 31.3*  PLT 122*  --  120*  BMET:  Recent Labs  08/26/12 0400  08/26/12 1816 08/27/12 0453  NA 137  --  135 130*  K 4.1  --  4.2 3.9  CL 106  --  103 98  CO2 23  --   --  21  GLUCOSE 122*  --  135* 123*  BUN 20  --  32* 31*  CREATININE 0.89  < > 0.90 0.85  CALCIUM 7.7*  --   --  7.8*  < > = values in this interval not displayed.  PT/INR:  Recent Labs  08/25/12 1400  LABPROT 15.8*  INR 1.29   Radiology: Dg Chest Port 1 View  08/27/2012  *RADIOLOGY REPORT*  Clinical Data: Postop cardiac surgery.  PORTABLE CHEST - 1 VIEW  Comparison: 08/26/2012  Findings: The left chest tube has been removed.  Swan-Ganz catheter has been removed.  No evidence for a pneumothorax.  Left basilar densities are suggestive for atelectasis.  Heart size is stable. Right jugular central venous  catheter is still present.  IMPRESSION: Removal of left chest tube without a pneumothorax.  Left basilar densities are suggestive for atelectasis.   Original Report Authenticated By: Richarda Overlie, M.D.    Dg Chest Portable 1 View In Am  08/26/2012  *RADIOLOGY REPORT*  Clinical Data: Postoperative film.  PORTABLE CHEST - 1 VIEW  Comparison: Chest x-ray 08/25/2012.  Findings: Previously noted nasogastric and endotracheal tubes have been removed.  Right IJ central venous Cordis through which a Theone Murdoch catheter has been passed and the pulmonic trunk.  Left-sided chest tube remains in position with tip and side port projecting over the left hemithorax.  A mediastinal drain is again noted. Epicardial pacing wires are noted.  Lung volumes are low.  There is some bibasilar opacities compatible with very mild subsegmental atelectasis.  No consolidative airspace disease.  No pleural effusions.  Crowding of the pulmonary vasculature, accentuated by low lung volumes, without frank pulmonary edema.  Cardiopericardial silhouette appears borderline enlarged. The patient is rotated to the left on today's exam, resulting in distortion of the mediastinal contours and reduced diagnostic sensitivity and specificity for mediastinal pathology.  Atherosclerosis in the thoracic aorta.  Status post median sternotomy for CABG.  IMPRESSION: 1.  Support apparatus, as above. 2.  Allowing for slight differences in patient positioning, the radiographic appearance of the chest is otherwise essentially unchanged, as above.   Original Report Authenticated By: Trudie Reed, M.D.    Dg Chest Portable 1 View  08/25/2012  *RADIOLOGY REPORT*  Clinical Data: Status post coronary bypass grafting  PORTABLE CHEST - 1 VIEW  Comparison: 08/24/2012  Findings: Postsurgical changes are now seen.  An endotracheal tube, nasogastric catheter, mediastinal drain, left thoracostomy catheters Swan-Ganz catheter are all noted in satisfactory position.  No  pneumothorax is seen.  No focal infiltrate is noted.  IMPRESSION: Tubes and lines as described above.  No acute abnormality is noted.   Original Report Authenticated By: Alcide Clever, M.D.      Assessment/Plan: S/P Procedure(s) (LRB): CORONARY ARTERY BYPASS GRAFTING (CABG) (N/A) INTRAOPERATIVE TRANSESOPHAGEAL ECHOCARDIOGRAM (N/A) Mobilize Diuresis Diabetes control increase ambulation and pul toilet D/c foley    Burak Zerbe B 08/27/2012 9:11 AM

## 2012-08-28 ENCOUNTER — Inpatient Hospital Stay (HOSPITAL_COMMUNITY): Payer: Medicare Other

## 2012-08-28 ENCOUNTER — Encounter (HOSPITAL_COMMUNITY): Payer: Self-pay | Admitting: Cardiothoracic Surgery

## 2012-08-28 LAB — CBC
HCT: 31.8 % — ABNORMAL LOW (ref 39.0–52.0)
Hemoglobin: 10.9 g/dL — ABNORMAL LOW (ref 13.0–17.0)
MCH: 30.3 pg (ref 26.0–34.0)
MCHC: 34.3 g/dL (ref 30.0–36.0)
MCV: 88.3 fL (ref 78.0–100.0)
Platelets: 159 10*3/uL (ref 150–400)
RBC: 3.6 MIL/uL — ABNORMAL LOW (ref 4.22–5.81)
RDW: 13.5 % (ref 11.5–15.5)
WBC: 7.2 10*3/uL (ref 4.0–10.5)

## 2012-08-28 LAB — BASIC METABOLIC PANEL
BUN: 24 mg/dL — ABNORMAL HIGH (ref 6–23)
CO2: 28 mEq/L (ref 19–32)
Calcium: 8.2 mg/dL — ABNORMAL LOW (ref 8.4–10.5)
Chloride: 98 mEq/L (ref 96–112)
Creatinine, Ser: 0.8 mg/dL (ref 0.50–1.35)
GFR calc Af Amer: >90 mL/min (ref >90)
GFR calc non Af Amer: 89 mL/min — ABNORMAL LOW (ref >90)
Glucose, Bld: 112 mg/dL — ABNORMAL HIGH (ref 70–99)
Potassium: 3.8 mEq/L (ref 3.5–5.1)
Sodium: 134 mEq/L — ABNORMAL LOW (ref 135–145)

## 2012-08-28 LAB — GLUCOSE, CAPILLARY
Glucose-Capillary: 110 mg/dL — ABNORMAL HIGH (ref 70–99)
Glucose-Capillary: 114 mg/dL — ABNORMAL HIGH (ref 70–99)
Glucose-Capillary: 132 mg/dL — ABNORMAL HIGH (ref 70–99)

## 2012-08-28 MED ORDER — SODIUM CHLORIDE 0.9 % IJ SOLN: 3.0000 mL | INTRAMUSCULAR | Status: DC | PRN

## 2012-08-28 MED ORDER — DOCUSATE SODIUM 100 MG PO CAPS
200.0000 mg | ORAL_CAPSULE | Freq: Every day | ORAL | Status: DC
  Administered 2012-08-28 – 2012-08-30 (×2): 200 mg via ORAL
  Filled 2012-08-28 (×4): qty 2

## 2012-08-28 MED ORDER — SODIUM CHLORIDE 0.9 % IV SOLN: 250.0000 mL | INTRAVENOUS | Status: DC | PRN

## 2012-08-28 MED ORDER — OXYCODONE HCL 5 MG PO TABS
5.0000 mg | ORAL_TABLET | ORAL | Status: DC | PRN
  Administered 2012-08-28 – 2012-08-31 (×11): 10 mg via ORAL
  Filled 2012-08-28 (×12): qty 2

## 2012-08-28 MED ORDER — METFORMIN HCL 500 MG PO TABS
500.0000 mg | ORAL_TABLET | Freq: Every day | ORAL | Status: DC
  Administered 2012-08-28: 500 mg via ORAL
  Filled 2012-08-28 (×3): qty 1

## 2012-08-28 MED ORDER — TRAMADOL HCL 50 MG PO TABS
50.0000 mg | ORAL_TABLET | ORAL | Status: DC | PRN
  Administered 2012-08-30 – 2012-08-31 (×3): 100 mg via ORAL
  Filled 2012-08-28 (×4): qty 2

## 2012-08-28 MED ORDER — BISACODYL 10 MG RE SUPP: 10.0000 mg | Freq: Every day | RECTAL | Status: DC | PRN

## 2012-08-28 MED ORDER — INSULIN ASPART 100 UNIT/ML ~~LOC~~ SOLN
0.0000 [IU] | Freq: Three times a day (TID) | SUBCUTANEOUS | Status: DC
  Administered 2012-08-28 – 2012-08-30 (×6): 2 [IU] via SUBCUTANEOUS

## 2012-08-28 MED ORDER — SODIUM CHLORIDE 0.9 % IJ SOLN
3.0000 mL | Freq: Two times a day (BID) | INTRAMUSCULAR | Status: DC
  Administered 2012-08-28 – 2012-08-30 (×6): 3 mL via INTRAVENOUS

## 2012-08-28 MED ORDER — MOVING RIGHT ALONG BOOK
Freq: Once | Status: AC
  Administered 2012-08-28: 08:00:00
  Filled 2012-08-28: qty 1

## 2012-08-28 MED ORDER — BISACODYL 5 MG PO TBEC
10.0000 mg | DELAYED_RELEASE_TABLET | Freq: Every day | ORAL | Status: DC | PRN
  Administered 2012-08-29: 10 mg via ORAL
  Filled 2012-08-28: qty 2

## 2012-08-28 MED ORDER — ASPIRIN EC 325 MG PO TBEC
325.0000 mg | DELAYED_RELEASE_TABLET | Freq: Every day | ORAL | Status: DC
  Administered 2012-08-28 – 2012-08-31 (×4): 325 mg via ORAL
  Filled 2012-08-28 (×4): qty 1

## 2012-08-28 MED ORDER — ONDANSETRON HCL 4 MG/2ML IJ SOLN: 4.0000 mg | Freq: Four times a day (QID) | INTRAMUSCULAR | Status: DC | PRN

## 2012-08-28 MED ORDER — ONDANSETRON HCL 4 MG PO TABS: 4.0000 mg | ORAL_TABLET | Freq: Four times a day (QID) | ORAL | Status: DC | PRN

## 2012-08-28 MED ORDER — PANTOPRAZOLE SODIUM 40 MG PO TBEC
40.0000 mg | DELAYED_RELEASE_TABLET | Freq: Every day | ORAL | Status: DC
  Administered 2012-08-28 – 2012-08-31 (×4): 40 mg via ORAL
  Filled 2012-08-28 (×4): qty 1

## 2012-08-28 MED FILL — Electrolyte-R (PH 7.4) Solution: INTRAVENOUS | Qty: 4000 | Status: AC

## 2012-08-28 MED FILL — Sodium Bicarbonate IV Soln 8.4%: INTRAVENOUS | Qty: 50 | Status: AC

## 2012-08-28 MED FILL — Magnesium Sulfate Inj 50%: INTRAMUSCULAR | Qty: 10 | Status: AC

## 2012-08-28 MED FILL — Heparin Sodium (Porcine) Inj 1000 Unit/ML: INTRAMUSCULAR | Qty: 30 | Status: AC

## 2012-08-28 MED FILL — Mannitol IV Soln 20%: INTRAVENOUS | Qty: 500 | Status: AC

## 2012-08-28 MED FILL — Heparin Sodium (Porcine) Inj 1000 Unit/ML: INTRAMUSCULAR | Qty: 20 | Status: AC

## 2012-08-28 MED FILL — Sodium Chloride Irrigation Soln 0.9%: Qty: 3000 | Status: AC

## 2012-08-28 MED FILL — Sodium Chloride IV Soln 0.9%: INTRAVENOUS | Qty: 1000 | Status: AC

## 2012-08-28 MED FILL — Lidocaine HCl IV Inj 20 MG/ML: INTRAVENOUS | Qty: 5 | Status: AC

## 2012-08-28 MED FILL — Potassium Chloride Inj 2 mEq/ML: INTRAVENOUS | Qty: 40 | Status: AC

## 2012-08-28 NOTE — Progress Notes (Signed)
Pt refused to ambulate this shift, he said he walked twice today with day shift nurse and that he was too tired to walk tonight. Pt is resting in bed at this time, will continue to monitor. 

## 2012-08-28 NOTE — Op Note (Signed)
NAMEMarland Kitchen  KAICEN, DESENA NO.:  [id number]  MEDICAL RECORD NO.:  [id number]  LOCATION:  2308                         FACILITY:  [facility]  PHYSICIAN:  Sheliah Plane, MD    DATE OF BIRTH:  06/20/1940  DATE OF PROCEDURE:  08/25/2012 DATE OF DISCHARGE:                              OPERATIVE REPORT   PREOPERATIVE DIAGNOSIS:  Recent non-ST elevation myocardial infarction.  POSTOPERATIVE DIAGNOSIS:  Recent non-ST elevation myocardial infarction with anatomic evidence of transmural inferior myocardial infarction.  PROCEDURE PERFORMED:  Coronary artery bypass grafting x5 with the left internal mammary to the left anterior descending coronary artery, reverse saphenous vein graft to the diagonal coronary artery, sequential reverse saphenous vein graft to the first and second obtuse marginal, and a reverse saphenous vein graft to the posterior descending coronary artery with right thigh and calf and left thigh and endo vein harvesting.  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  He noted the diaphoresis was particularly pronounced.  He was seen in the emergency room and stabilized medically.  Cardiology was consulted and cardiac catheterization was performed.  Initial troponins were not elevated.  Because of the patient's significant three-vessel disease with 80-90% LAD involving diagonal 70%, OM1 and 2, and total occlusion of the proximal right coronary artery with very faint collateral filling Cardiac Surgery was consulted for consideration of coronary artery bypass grafting.  The risks and options were discussed with the patient who agreed and signed informed consent.  DESCRIPTION OF PROCEDURE:  With Swan-Ganz and arterial line monitors in place, the  patient underwent general endotracheal anesthesia without incident.  Skin of the chest and legs was prepped with Betadine and draped in usual sterile manner.  Using the [employer] endo vein harvesting system, vein was harvested from the right thigh and calf.  This provided 2 segments of vein and the very distal vein was very small.  Additional segment of vein was harvested from the left thigh.  Median sternotomy was performed.  Left internal mammary artery was dissected down as a pedicle graft.  Distal artery was divided and had good free flow. Pericardium was opened.  Overall ventricular function appeared preserved with exception of the inferior wall.  Between the acute margin and the posterior descending coronary artery was an area of obvious wall thickness infarction.  The patient was systemically heparinized.  The ascending aorta was cannulated.  The right atrium was cannulated and aortic root vent cardioplegia needle was introduced into the ascending aorta.  The patient was placed on cardiopulmonary bypass at 2.4 L/min/m2.  Sites of anastomosis were selected and dissected out of the epicardium.  The patient's body temperature was cooled to 32 degrees. Aortic crossclamp was applied.  600 mL of cold blood potassium cardioplegia was administered.  Attention was turned first to the posterior descending coronary artery which was opened somewhat small vessel approximately 1.3 mm in size.  Using a running 7-0 Prolene, distal anastomosis was performed.  The heart was  then elevated, and 2 large obtuse marginal vessels were each opened using diamond type side- to-side anastomosis.  A segment of reverse saphenous vein graft was anastomosed to the first obtuse marginal.  The distal extent of the same vein was then used to anastomose to the second obtuse marginal. Additional cold blood cardioplegia was administered down the vein graft. Attention was then turned to a small diagonal branch 1.3 mm in  size. Using a running 7-0 Prolene, distal anastomosis was performed.  The mid to distal LAD was opened, admitted a 1.5-mm probe distally.  Using a running 8-0 Prolene, left internal mammary artery was anastomosed to the left anterior descending coronary artery.  With release of the Edwards bulldog on the mammary artery was a rise in myocardial septal temperature.  Bulldog was placed back on the mammary artery with crossclamp still in place.  The 3 veins were trimmed to the appropriate length and 3 punch aortotomies were performed.  Each of the 3 vein grafts were anastomosed to the ascending aorta.  Air was evacuated from the ascending aorta and grafts.  The aortic cross-clamp was removed with total cross-clamp time of 103 minutes.  With removal of the bulldog on the mammary artery there was prompt rise in myocardial septal temperature.  The patient spontaneously converted to a sinus rhythm. Sites of anastomosis were inspected and were free of bleeding.  He was then ventilated and weaned from cardiopulmonary bypass without difficulty.  TEE showed good ventricular function.  He was decannulated in usual fashion.  Protamine sulfate was administered.  A left pleural tube and a Blake mediastinal drain were left in place.  Pericardium was loosely reapproximated.  Sternum was closed with #6 stainless steel wire.  Fascia closed with interrupted 0 Vicryl and 3-0 Vicryl in subcutaneous tissue, and 4-0 subcuticular stitch in skin edges.  Dry dressings were applied.  Sponge and needle count was reported as correct at the completion of procedure.  The patient tolerated the procedure without obvious complication and was transferred to Surgical Intensive Care Unit for further postoperative care.  Total pump time was 135 minutes.  The patient did not require any blood bank blood products during the procedure.     Sheliah Plane, MD     EG/MEDQ  D:  08/28/2012  T:  08/28/2012  Job:  [id number]

## 2012-08-28 NOTE — Progress Notes (Signed)
    SUBJECTIVE: Feels ok this am. Ambulated x 1. No chest pain or SOB  BP 132/71  Pulse 89  Temp(Src) 97.8 F (36.6 C) (Oral)  Resp 19  Ht 6\' 1"  (1.854 m)  Wt 252 lb 3.3 oz (114.4 kg)  BMI 33.28 kg/m2  SpO2 95%  Intake/Output Summary (Last 24 hours) at 08/28/12 1319 Last data filed at 08/28/12 1100  Gross per 24 hour  Intake    720 ml  Output   2440 ml  Net  -1720 ml    PHYSICAL EXAM General: Well developed, well nourished, in no acute distress. Alert and oriented x 3.  Psych:  Good affect, responds appropriately Neck: No JVD. No masses noted.  Lungs: Clear bilaterally with no wheezes or rhonci noted.  Heart: RRR with no murmurs noted. Abdomen: Bowel sounds are present. Soft, non-tender.  Extremities: No lower extremity edema.   LABS: Basic Metabolic Panel:  Recent Labs  96/04/54 0400 08/26/12 1805  08/27/12 0453 08/28/12 0400  NA 137  --   < > 130* 134*  K 4.1  --   < > 3.9 3.8  CL 106  --   < > 98 98  CO2 23  --   --  21 28  GLUCOSE 122*  --   < > 123* 112*  BUN 20  --   < > 31* 24*  CREATININE 0.89 0.94  < > 0.85 0.80  CALCIUM 7.7*  --   --  7.8* 8.2*  MG 2.4 2.4  --   --   --   < > = values in this interval not displayed. CBC:  Recent Labs  08/27/12 0453 08/28/12 0400  WBC 8.2 7.2  HGB 10.7* 10.9*  HCT 31.3* 31.8*  MCV 88.9 88.3  PLT 120* 159   Current Meds: . aspirin EC  325 mg Oral Daily  . atorvastatin  80 mg Oral q1800  . buprenorphine  10 mcg Transdermal Weekly  . buPROPion  300 mg Oral Daily  . docusate sodium  200 mg Oral Daily  . enoxaparin (LOVENOX) injection  30 mg Subcutaneous QHS  . insulin aspart  0-24 Units Subcutaneous TID AC & HS  . insulin detemir  15 Units Subcutaneous Q1200  . metFORMIN  500 mg Oral Q breakfast  . metoprolol tartrate  12.5 mg Oral BID   Or  . metoprolol tartrate  12.5 mg Per Tube BID  . pantoprazole  40 mg Oral QAC breakfast  . sodium chloride  3 mL Intravenous Q12H  . Vilazodone HCl  40 mg Oral Daily      ASSESSMENT AND PLAN:  1. 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 Troponin elevated c/w NSTEMI. Now s/p 5V CABG 08/25/12 per Dr. Tyrone Sage. Doing well post bypass. Continue ASA, statin, beta blocker. Volume status is ok. Weight is stable.    MCALHANY,CHRISTOPHER  3/31/20141:19 PM

## 2012-08-28 NOTE — Progress Notes (Signed)
Patient ID: Timothy Cervantes, male   DOB: Jul 27, 1940, 72 y.o.   MRN: [id number] TCTS DAILY PROGRESS NOTE                   [address]            [place] [address]          [phone]      3 Days Post-Op Procedure(s) (LRB): CORONARY ARTERY BYPASS GRAFTING (CABG) (N/A) INTRAOPERATIVE TRANSESOPHAGEAL ECHOCARDIOGRAM (N/A)  Total Length of Stay:  LOS: 5 days   Subjective: Feels better this am, no sternal pain, chronic back pain better controlled   Objective: Vital signs in last 24 hours: Temp:  [98.1 F (36.7 C)-99.4 F (37.4 C)] 98.1 F (36.7 C) (03/31 0400) Pulse Rate:  [76-110] 86 (03/31 0700) Cardiac Rhythm:  [-] Normal sinus rhythm (03/30 1945) Resp:  [17-31] 21 (03/31 0700) BP: (87-127)/(46-65) 97/53 mmHg (03/31 0700) SpO2:  [92 %-98 %] 94 % (03/31 0700) Weight:  [252 lb 3.3 oz (114.4 kg)] 252 lb 3.3 oz (114.4 kg) (03/31 0600)  Filed Weights   08/25/12 0400 08/26/12 0545 08/28/12 0600  Weight: 255 lb 8.2 oz (115.9 kg) 255 lb 8.2 oz (115.9 kg) 252 lb 3.3 oz (114.4 kg)    Weight change:    Hemodynamic parameters for last 24 hours:    Intake/Output from previous day: 03/30 0701 - 03/31 0700 In: 1440 [P.O.:1440] Out: 3115 [Urine:3115]  Intake/Output this shift:    Current Meds: Scheduled Meds: . acetaminophen  1,000 mg Oral Q6H   Or  . acetaminophen (TYLENOL) oral liquid 160 mg/5 mL  975 mg Per Tube Q6H  . aspirin EC  325 mg Oral Daily   Or  . aspirin  324 mg Per Tube Daily  . atorvastatin  80 mg Oral q1800  . bisacodyl  10 mg Oral Daily   Or  . bisacodyl  10 mg Rectal Daily  . buprenorphine  10 mcg Transdermal Weekly  . buPROPion  300 mg Oral Daily  . docusate sodium  200 mg Oral Daily  . enoxaparin (LOVENOX) injection  30 mg Subcutaneous QHS  . insulin aspart  0-24 Units Subcutaneous Q4H  . insulin detemir  15 Units Subcutaneous Q1200  . metoprolol tartrate  12.5 mg Oral BID   Or  . metoprolol tartrate  12.5 mg Per Tube BID  .  pantoprazole  40 mg Oral Daily  . sodium chloride  3 mL Intravenous Q12H  . Vilazodone HCl  40 mg Oral Daily   Continuous Infusions: . sodium chloride 20 mL/hr at 08/25/12 1515  . sodium chloride 20 mL/hr at 08/27/12 0800  . sodium chloride    . lactated ringers 20 mL/hr at 08/26/12 1600  . nitroGLYCERIN Stopped (08/25/12 1446)  . phenylephrine (NEO-SYNEPHRINE) Adult infusion Stopped (08/25/12 1833)   PRN Meds:.metoprolol, midazolam, morphine injection, ondansetron (ZOFRAN) IV, oxyCODONE, sodium chloride, traZODone  General appearance: alert and cooperative Neurologic: intact Heart: regular rate and rhythm, S1, S2 normal, no murmur, click, rub or gallop Lungs: clear to auscultation bilaterally Abdomen: soft, non-tender; bowel sounds normal; no masses,  no organomegaly Extremities: extremities normal, atraumatic, no cyanosis or edema and Homans sign is negative, no sign of DVT Wound: sternum stable  Lab Results: CBC: Recent Labs  08/27/12 0453 08/28/12 0400  WBC 8.2 7.2  HGB 10.7* 10.9*  HCT 31.3* 31.8*  PLT 120* 159   BMET:  Recent Labs  08/27/12 0453 08/28/12 0400  NA 130*  134*  K 3.9 3.8  CL 98 98  CO2 21 28  GLUCOSE 123* 112*  BUN 31* 24*  CREATININE 0.85 0.80  CALCIUM 7.8* 8.2*    PT/INR:  Recent Labs  08/25/12 1400  LABPROT 15.8*  INR 1.29   Radiology: Dg Chest Port 1 View  08/27/2012  *RADIOLOGY REPORT*  Clinical Data: Postop cardiac surgery.  PORTABLE CHEST - 1 VIEW  Comparison: 08/26/2012  Findings: The left chest tube has been removed.  Swan-Ganz catheter has been removed.  No evidence for a pneumothorax.  Left basilar densities are suggestive for atelectasis.  Heart size is stable. Right jugular central venous catheter is still present.  IMPRESSION: Removal of left chest tube without a pneumothorax.  Left basilar densities are suggestive for atelectasis.   Original Report Authenticated By: Richarda Overlie, M.D.      Assessment/Plan: S/P Procedure(s)  (LRB): CORONARY ARTERY BYPASS GRAFTING (CABG) (N/A) INTRAOPERATIVE TRANSESOPHAGEAL ECHOCARDIOGRAM (N/A) Mobilize Diuresis Diabetes control Plan for transfer to step-down: see transfer orders     Haru Shaff B 08/28/2012 7:32 AM

## 2012-08-28 NOTE — Care Management Note (Signed)
    Page 1 of 1   08/31/2012     3:43:17 PM   CARE MANAGEMENT NOTE 08/31/2012  Patient:  Timothy Cervantes, Timothy Cervantes   Account Number:  [id number]  Date Initiated:  08/28/2012  Documentation initiated by:  Avie Arenas  Subjective/Objective Assessment:   post op CABG x5 on 3-28.  Has spouse.     Action/Plan:   Anticipated DC Date:  08/28/2012   Anticipated DC Plan:  HOME W HOME HEALTH SERVICES      DC Planning Services  CM consult      Choice offered to / List presented to:             Status of service:  Completed, signed off Medicare Important Message given?   (If response is "NO", the following Medicare IM given date fields will be blank) Date Medicare IM given:   Date Additional Medicare IM given:    Discharge Disposition:  HOME/SELF CARE  Per UR Regulation:  Reviewed for med. necessity/level of care/duration of stay  If discussed at Long Length of Stay Meetings, dates discussed:    Comments:  ContactTomi, [place] [phone] 08/31/12 Majestic Molony,RN,BSN [phone] PT FOR DC HOME TODAY WITH WIFE.  NO HOME NEEDS.  08-28-12 10:30am Avie Arenas, RNBSN [phone] Talked with patient.  Confirmed lives with wife.  Wife and other family members plan to be with him 24/7 on discharge. CM will continue to follow for further discharge needs.

## 2012-08-28 NOTE — Progress Notes (Signed)
UR Completed.  Lauretta Sallas Jane [phone] 08/28/2012  

## 2012-08-29 ENCOUNTER — Inpatient Hospital Stay (HOSPITAL_COMMUNITY): Payer: Medicare Other

## 2012-08-29 LAB — GLUCOSE, CAPILLARY: Glucose-Capillary: 102 mg/dL — ABNORMAL HIGH (ref 70–99)

## 2012-08-29 LAB — BASIC METABOLIC PANEL
BUN: 26 mg/dL — ABNORMAL HIGH (ref 6–23)
CO2: 26 mEq/L (ref 19–32)
Calcium: 8.2 mg/dL — ABNORMAL LOW (ref 8.4–10.5)
Chloride: 99 mEq/L (ref 96–112)
Creatinine, Ser: 0.77 mg/dL (ref 0.50–1.35)
GFR calc Af Amer: >90 mL/min (ref >90)
GFR calc non Af Amer: >90 mL/min (ref >90)
Glucose, Bld: 121 mg/dL — ABNORMAL HIGH (ref 70–99)
Potassium: 3.8 mEq/L (ref 3.5–5.1)
Sodium: 136 mEq/L (ref 135–145)

## 2012-08-29 LAB — CBC
HCT: 32 % — ABNORMAL LOW (ref 39.0–52.0)
Hemoglobin: 10.9 g/dL — ABNORMAL LOW (ref 13.0–17.0)
MCH: 30 pg (ref 26.0–34.0)
MCHC: 34.1 g/dL (ref 30.0–36.0)
MCV: 88.2 fL (ref 78.0–100.0)
Platelets: 218 10*3/uL (ref 150–400)
RBC: 3.63 MIL/uL — ABNORMAL LOW (ref 4.22–5.81)
RDW: 13.3 % (ref 11.5–15.5)
WBC: 6.9 10*3/uL (ref 4.0–10.5)

## 2012-08-29 MED ORDER — LACTULOSE 10 GM/15ML PO SOLN
20.0000 g | Freq: Once | ORAL | Status: DC
  Filled 2012-08-29: qty 30

## 2012-08-29 MED ORDER — METFORMIN HCL 500 MG PO TABS
500.0000 mg | ORAL_TABLET | Freq: Every day | ORAL | Status: DC
  Administered 2012-08-30 – 2012-08-31 (×2): 500 mg via ORAL
  Filled 2012-08-29 (×3): qty 1

## 2012-08-29 MED ORDER — POTASSIUM CHLORIDE CRYS ER 20 MEQ PO TBCR
20.0000 meq | EXTENDED_RELEASE_TABLET | Freq: Once | ORAL | Status: AC
  Administered 2012-08-29: 20 meq via ORAL
  Filled 2012-08-29 (×2): qty 1

## 2012-08-29 NOTE — Plan of Care (Signed)
Problem: Phase III Progression Outcomes Goal: Time patient transferred to PCTU/Telemetry POD Outcome: Completed/Met Date Met:  08/28/12 1500

## 2012-08-29 NOTE — Progress Notes (Addendum)
                   [address]            [place] [address]          [phone]      4 Days Post-Op Procedure(s) (LRB): CORONARY ARTERY BYPASS GRAFTING (CABG) (N/A) INTRAOPERATIVE TRANSESOPHAGEAL ECHOCARDIOGRAM (N/A)  Subjective: Patient not feeling well this morning. Has some nausea and constipation. He denies abdominal pain or emesis. Passing flatus but no bowel movement.  Objective: Vital signs in last 24 hours: Temp:  [97.8 F (36.6 C)-99.7 F (37.6 C)] 98.4 F (36.9 C) (04/01 0505) Pulse Rate:  [49-95] 91 (04/01 0505) Cardiac Rhythm:  [-] Normal sinus rhythm;Sinus tachycardia (03/31 1945) Resp:  [18-27] 20 (04/01 0505) BP: (108-143)/(62-91) 108/63 mmHg (04/01 0505) SpO2:  [94 %-96 %] 96 % (04/01 0505) Weight:  [113.6 kg (250 lb 7.1 oz)] 113.6 kg (250 lb 7.1 oz) (04/01 0505)  Pre op weight  115.9 kg Current Weight  08/29/12 113.6 kg (250 lb 7.1 oz)      Intake/Output from previous day: 03/31 0701 - 04/01 0700 In: 360 [P.O.:360] Out: 900 [Urine:900]   Physical Exam:  Cardiovascular: RRR Pulmonary: Diminshed at left base; no rales, wheezes, or rhonchi. Abdomen: Soft, non tender, some distention,bowel sounds present. Extremities:Tracebilateral lower extremity edema. Wounds: Lower extremity dressings are clean and dry.  Sternal wound is clean and dry, no erythema or signs of infection   Lab Results: CBC: Recent Labs  08/28/12 0400 08/29/12 0450  WBC 7.2 6.9  HGB 10.9* 10.9*  HCT 31.8* 32.0*  PLT 159 218   BMET:  Recent Labs  08/28/12 0400 08/29/12 0450  NA 134* 136  K 3.8 3.8  CL 98 99  CO2 28 26  GLUCOSE 112* 121*  BUN 24* 26*  CREATININE 0.80 0.77  CALCIUM 8.2* 8.2*    PT/INR:  Lab Results  Component Value Date   INR 1.29 08/25/2012   INR 0.94 08/23/2012   INR 0.97 08/12/2010   ABG:  INR: Will add last result for INR, ABG once components are confirmed Will add last 4 CBG results once components are  confirmed  Assessment/Plan:  1. CV - SR. On Lopressor 12.5 bid 2.  Pulmonary -CXR this am appears stable (no pneumothorax, left pleural effusion and atelectasis).Encourage incentive spirometer 3.GI-nausea and bloated this am. Will obtain portable KUB. Likely post op ileus. LOC constipation 4.  Acute blood loss anemia - H and H stable at 10.9 and 32 5.Supplement potassium 6.DM-CBGs 139/132/102. Pre op HGA1C 7.4 Continue Metformin 500 daily. Will stop Levemir 7.Remove EPW in am   ZIMMERMAN,DONIELLE MPA-C 08/29/2012,7:59 AM  Slowly improving Home 1-2 days I have seen and examined Mellody Memos and agree with the above assessment  and plan.  Delight Ovens MD Beeper [phone] Office [phone] 08/29/2012 3:04 PM

## 2012-08-29 NOTE — Progress Notes (Signed)
Pt ambulated 300ft w/ RW x 1 assist, slow steady gait.  Will continue to monitor. 

## 2012-08-29 NOTE — Progress Notes (Signed)
    SUBJECTIVE: c/o constipation.   Tele: NSR  BP 108/63  Pulse 91  Temp(Src) 98.4 F (36.9 C) (Oral)  Resp 20  Ht 6\' 1"  (1.854 m)  Wt 250 lb 7.1 oz (113.6 kg)  BMI 33.05 kg/m2  SpO2 96%  Intake/Output Summary (Last 24 hours) at 08/29/12 0749 Last data filed at 08/28/12 1731  Gross per 24 hour  Intake    360 ml  Output    900 ml  Net   -540 ml    PHYSICAL EXAM General: Well developed, well nourished, in no acute distress. Alert and oriented x 3.  Psych:  Good affect, responds appropriately Neck: No JVD. No masses noted.  Lungs: Clear bilaterally with no wheezes or rhonci noted.  Heart: RRR with no murmurs noted. Abdomen: Bowel sounds are present. Soft, non-tender.  Extremities: No lower extremity edema.   LABS: Basic Metabolic Panel:  Recent Labs  95/62/13 1805  08/28/12 0400 08/29/12 0450  NA  --   < > 134* 136  K  --   < > 3.8 3.8  CL  --   < > 98 99  CO2  --   < > 28 26  GLUCOSE  --   < > 112* 121*  BUN  --   < > 24* 26*  CREATININE 0.94  < > 0.80 0.77  CALCIUM  --   < > 8.2* 8.2*  MG 2.4  --   --   --   < > = values in this interval not displayed. CBC:  Recent Labs  08/28/12 0400 08/29/12 0450  WBC 7.2 6.9  HGB 10.9* 10.9*  HCT 31.8* 32.0*  MCV 88.3 88.2  PLT 159 218   Current Meds: . aspirin EC  325 mg Oral Daily  . atorvastatin  80 mg Oral q1800  . buprenorphine  10 mcg Transdermal Weekly  . buPROPion  300 mg Oral Daily  . docusate sodium  200 mg Oral Daily  . enoxaparin (LOVENOX) injection  30 mg Subcutaneous QHS  . insulin aspart  0-24 Units Subcutaneous TID AC & HS  . insulin detemir  15 Units Subcutaneous Q1200  . metFORMIN  500 mg Oral Q breakfast  . metoprolol tartrate  12.5 mg Oral BID   Or  . metoprolol tartrate  12.5 mg Per Tube BID  . pantoprazole  40 mg Oral QAC breakfast  . sodium chloride  3 mL Intravenous Q12H  . Vilazodone HCl  40 mg Oral Daily     ASSESSMENT AND PLAN:  1. 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 Troponin elevated c/w NSTEMI. Now s/p 5V CABG 08/25/12 per Dr. Tyrone Sage. Doing well post bypass. Continue ASA, statin, beta blocker. Volume status is ok. Weight is stable.     Falicity Sheets  4/1/20147:49 AM

## 2012-08-29 NOTE — Progress Notes (Signed)
Pt had 9bt VT, asymptomatic, VSS.  Will continue to closely monitor.

## 2012-08-29 NOTE — Progress Notes (Signed)
CARDIAC REHAB PHASE I   PRE:  Rate/Rhythm: 86 SR  BP:  Supine: 108/54  Sitting:   Standing:    SaO2: 90 RA  MODE:  Ambulation: 310 ft   POST:  Rate/Rhythm: 102 ST  BP:  Supine:   Sitting: 104/54  Standing:    SaO2: 92 RA 1355-1430 Assisted X 1 and used walker to ambulate. Gait steady with walker. VS stable. Pt back to bed after walk per his request with call light in reach.Pt c/o of back discomfort with walking.   Melina Copa RN 08/29/2012 2:27 PM

## 2012-08-29 NOTE — Progress Notes (Signed)
Pt ambulated 300 ft with rolling walker x 1 assist. No sign of distress noted. Pt assisted back in bed, call bell paced within reach, will continue to monitor.

## 2012-08-30 DIAGNOSIS — I255 Ischemic cardiomyopathy: Secondary | ICD-10-CM | POA: Diagnosis present

## 2012-08-30 DIAGNOSIS — I2589 Other forms of chronic ischemic heart disease: Secondary | ICD-10-CM

## 2012-08-30 LAB — GLUCOSE, CAPILLARY
Glucose-Capillary: 98 mg/dL (ref 70–99)
Glucose-Capillary: 115 mg/dL — ABNORMAL HIGH (ref 70–99)

## 2012-08-30 NOTE — Discharge Summary (Signed)
Physician Discharge Summary  Patient ID: Timothy Cervantes MRN: [id number] DOB/AGE: [birth year] 70 y.o.  Admit date: 08/23/2012 Discharge date: 08/31/2012  Admission Diagnoses: 1. S/p NSTEMI 2.Multivessel CAD 3.History of DM 4.History of hypertension 5.History of tobacco abuse 6.History of chronic back pain  Discharge Diagnoses:  1. S/p NSTEMI 2.Multivessel CAD 3.History of DM 4.History of hypertension 5.History of tobacco abuse 6.History of chronic back pain 7.Mild ABL anemia   Procedure (s):  1. Cardiac catheterization done by Dr. Clifton James on 08/23/2012: Angiographic Findings:  Left main: No obstructive disease.  Left Anterior Descending Artery: Large caliber vessel that courses to the apex. The proximal vessel is severely calcified and has diffuse 70% stenosis. The mid vessel is calcified and has a focal 90% stenosis. The remainder of the mid and distal vessel. The diagonal is moderate in caliber and has ostial 70% stenosis.  Circumflex Artery: Large caliber vessel with mild proximal stenosis. The first obtuse marginal branch is moderate in caliber with 99% proximal stenosis. There is a moderate caliber posterolateral branch with no disease noted.  Right Coronary Artery: 100% proximal occlusion, chronic appearing.  Left Ventricular Angiogram: LVEF=55-60%.  2.Coronary artery bypass grafting x5 with the left  internal mammary to the left anterior descending coronary artery, reverse saphenous vein graft to the diagonal coronary artery, sequential reverse saphenous vein graft to the first and second obtuse marginal, and a reverse saphenous vein graft to the posterior descending coronary  artery with right thigh and calf and left thigh and endo vein  Harvesting by Dr. Tyrone Sage on 08/25/2012.   History of Presenting Illness: This is a 72 y/o male with a past medical history of DM, hypertension, and history of tobacco abuse. He has no prior cardiac history. He was in his usual state of  health until the morning of 3/26 when he was outside vacuuming out his boat and had sudden onset of marked diaphoresis. He took off his jacket and continued to work, but remained very diaphoretic. He subsequently developed jaw discomfort, which moved into his chest and subsequently into his left arm. He went inside and took 2 baby aspirin and then sat down; however, the discomfort persisted. He then called 911. Upon EMS arrival, he was given additional aspirin as well as sublingual nitroglycerin without significant improvement in symptoms. He was taken to [facility] ED and placed on nitroglycerin infusion which has been titrated to 30 mcg per minute. He initially got some relief; however, his discomfort was then rated as a 6/10. ECG throughout his discomfort has been without acute ST or T changes. His initial troponin is normal. Chest x-ray is without acute findings. He underwent a cardiac catheterization on   3/26. He was found to have multivessel coronary artery disease. A cardiothoracic consultation was obtained with Dr. Tyrone Sage for the consideration of coronary artery bypass grafting surgery. Pre operative carotid duplex US showed no significant internal carotid artery stenosis.Potential risks, complications, and benefits were discussed with the patient and he agreed to proceed. He underwent a CABG x 5 on 08/25/2012.  Brief Hospital Course:  The patient was extubated the evening of surgery without difficulty. He remained afebrile and hemodynamically stable. Theone Murdoch, a line, chest tubes, and foley were removed early in the post operative course. Lopressor was started. He was volume over loaded and diuresed. He was weaned off the insulin drip. Once he was tolerating a diet, his home diabetic medicine was restarted. The patient's HGA1C pre op was 7.4. The patient was felt surgically stable  for transfer from the ICU to [facility]U for further convalescence on 08/28/2012. He continues to progress with cardiac rehab.  He was ambulating on room air. He has been tolerating a diet. He did have constipation and nausea on post operative day 4. KUB showed possible mild ileus.He had a bowel movement. His nausea subsequently resolved.Epicardial pacing wires and chest tube sutures will be removed prior to discharge. He is felt surgically stable for discharge today.   Latest Vital Signs: Blood pressure 109/68, pulse 71, temperature 98 F (36.7 C), temperature source Oral, resp. rate 18, height 6\' 1"  (1.854 m), weight 112.3 kg (247 lb 9.2 oz), SpO2 99.00%.  Physical Exam: Cardiovascular: RRR  Pulmonary: Diminshed at left base; no rales, wheezes, or rhonchi.  Abdomen: Soft, non tender, some distention,bowel sounds present.  Extremities:Tracebilateral lower extremity edema.  Wounds: Lower extremity dressings are clean and dry. Sternal wound is clean and dry, no erythema or signs of infection    Discharge Condition:Stable  Recent laboratory studies:  Lab Results  Component Value Date   WBC 6.9 08/29/2012   HGB 10.9* 08/29/2012   HCT 32.0* 08/29/2012   MCV 88.2 08/29/2012   PLT 218 08/29/2012   Lab Results  Component Value Date   NA 136 08/29/2012   K 3.8 08/29/2012   CL 99 08/29/2012   CO2 26 08/29/2012   CREATININE 0.77 08/29/2012   GLUCOSE 121* 08/29/2012      Diagnostic Studies: Dg Chest 2 View  08/29/2012  *RADIOLOGY REPORT*  Clinical Data: Abdominal pain.  Postop for CABG.  CHEST - 2 VIEW  Comparison: 08/28/2012  Findings: Lateral view degraded by patient arm position.  Prior median sternotomy.  Removal of right IJ Cordis sheath. Midline trachea.  Cardiomegaly accentuated by AP portable technique.  Small left pleural effusion persists. No pneumothorax. Mild pulmonary venous congestion, without overt congestive failure. Patchy bibasilar atelectasis is similar.  IMPRESSION: Removal of right IJ Cordis sheath; otherwise, no significant change.  Mild pulmonary venous congestion with small left pleural effusion and bibasilar  atelectasis.   Original Report Authenticated By: Jeronimo Greaves, M.D.    Dg Abd Port 1V  08/29/2012  *RADIOLOGY REPORT*  Clinical Data: History of abdominal distention.  PORTABLE ABDOMEN - 1 VIEW  Comparison: CT 04/16/2010.  Findings: Cholecystectomy clips are seen.  Leads project over the upper abdomen.  There is mild gaseous distention of loops of small and large intestine in a nonspecific pattern.  Air fluid levels cannot be evaluated without erect or decubitus examination.  No opaque calculi or abnormal intra-abdominal calcifications are seen. There is minimal degenerative spondylosis.  IMPRESSION: There is mild gaseous distention of loops of small and large intestine in a nonspecific pattern. This may reflect a mild ileus. Air fluid levels cannot be evaluated without erect or decubitus examination.  No opaque calculi or abnormal intra-abdominal calcifications are seen.   Original Report Authenticated By: Onalee Hua Call        Future Appointments Provider Department Dept Phone   09/28/2012 4:00 PM Delight Ovens, MD [facility] [phone]     Discharge Medications:   Medication List    STOP taking these medications       amLODipine-valsartan 5-320 MG per tablet  Commonly known as:  EXFORGE      TAKE these medications       aspirin 325 MG EC tablet  Take 1 tablet (325 mg total) by mouth daily.     atorvastatin 80 MG tablet  Commonly  known as:  LIPITOR  Take 1 tablet (80 mg total) by mouth daily at 6 PM.     buprenorphine 10 MCG/HR Ptwk  Commonly known as:  BUTRANS - dosed mcg/hr  Place 10 mcg onto the skin once a week.     buPROPion 300 MG 24 hr tablet  Commonly known as:  WELLBUTRIN XL  Take 300 mg by mouth daily.     cholecalciferol 1000 UNITS tablet  Commonly known as:  VITAMIN D  Take 1,000 Units by mouth daily.     metFORMIN 500 MG tablet  Commonly known as:  GLUCOPHAGE  Take 500 mg by mouth daily.     metoprolol tartrate 25 MG  tablet  Commonly known as:  LOPRESSOR  Take 0.5 tablets (12.5 mg total) by mouth 2 (two) times daily.     oxyCODONE-acetaminophen 10-325 MG per tablet  Commonly known as:  PERCOCET  Take 1 tablet by mouth every 4 (four) hours as needed for pain.     pantoprazole 40 MG tablet  Commonly known as:  PROTONIX  Take 40 mg by mouth daily.     TRAZODONE HCL PO  Take by mouth. Patient takes 1 table at night for sleep     VIIBRYD 40 MG Tabs  Generic drug:  Vilazodone HCl  Take 40 mg by mouth daily.       The patient has been discharged on:   1.Beta Blocker:  Yes [ x  ]                              No   [   ]                              If No, reason:  2.Ace Inhibitor/ARB: Yes [   ]                                     No  [  x  ]                                     If No, reason: Labile blood pressure  3.Statin:   Yes [  x ]                  No  [   ]                  If No, reason:  4.Ecasa:  Yes  [  x ]                  No   [   ]                  If No, reason:  Follow Up Appointments: Follow-up Information   Follow up with MCALHANY,CHRISTOPHER, MD. (Call for a follow up appointment for 2 weeks)    Contact information:   [address] [place] [address] [phone]       Follow up with GERHARDT,EDWARD B, MD. (PA/LAT CXR to be taken (at [facility] which is in the same building as Dr. Dennie Maizes office) on 5In the and/1/is2014 at 3:00 pm;Appointment with Dr. Tyrone Sage is on 09/28/2012  at 4:00 pm)    Contact information:   [address] [employer] [address] [place] [address] [phone]       Follow up with [name], MD. (Call for follow up appointment regarding further surveillance of HGA1C 7.4)    Contact information:   [address], [address] [place] [address] [phone]       Signed: Doree Fudge MPA-C 08/31/2012, 7:47 AM

## 2012-08-30 NOTE — Progress Notes (Signed)
D/C EPW per protocol and as ordered, all ends intact, no ectopy/bleeding noted, pt tolerated well.  Pt reminded to lie supine approximately one hour.  INR 2.0, VSS.  Will continue to monitor.

## 2012-08-30 NOTE — Progress Notes (Signed)
                   [address]            [place] [address]          [phone]      5 Days Post-Op Procedure(s) (LRB): CORONARY ARTERY BYPASS GRAFTING (CABG) (N/A) INTRAOPERATIVE TRANSESOPHAGEAL ECHOCARDIOGRAM (N/A)  Subjective: Patient eating breakfast. Had bowel movement. Only complaint is soreness from sternal incision.  Objective: Vital signs in last 24 hours: Temp:  [98.1 F (36.7 C)-98.6 F (37 C)] 98.6 F (37 C) (04/02 0501) Pulse Rate:  [69-89] 77 (04/02 0501) Cardiac Rhythm:  [-] Normal sinus rhythm (04/01 1915) Resp:  [18-20] 18 (04/02 0501) BP: (108-128)/(54-76) 122/70 mmHg (04/02 0501) SpO2:  [90 %-98 %] 98 % (04/02 0501) Weight:  [112.401 kg (247 lb 12.8 oz)] 112.401 kg (247 lb 12.8 oz) (04/02 0501)  Pre op weight  115.9 kg Current Weight  08/30/12 112.401 kg (247 lb 12.8 oz)      Intake/Output from previous day: 04/01 0701 - 04/02 0700 In: 483 [P.O.:480; I.V.:3] Out: 1751 [Urine:1750; Stool:1]   Physical Exam:  Cardiovascular: RRR Pulmonary: Diminshed at left base; no rales, wheezes, or rhonchi. Abdomen: Soft, non tender, some distention,bowel sounds present. Extremities:Tracebilateral lower extremity edema. Wounds: Lower extremity dressings are clean and dry.  Sternal wound is clean and dry, no erythema or signs of infection   Lab Results: CBC:  Recent Labs  08/28/12 0400 08/29/12 0450  WBC 7.2 6.9  HGB 10.9* 10.9*  HCT 31.8* 32.0*  PLT 159 218   BMET:   Recent Labs  08/28/12 0400 08/29/12 0450  NA 134* 136  K 3.8 3.8  CL 98 99  CO2 28 26  GLUCOSE 112* 121*  BUN 24* 26*  CREATININE 0.80 0.77  CALCIUM 8.2* 8.2*    PT/INR:  Lab Results  Component Value Date   INR 1.29 08/25/2012   INR 0.94 08/23/2012   INR 0.97 08/12/2010   ABG:  INR: Will add last result for INR, ABG once components are confirmed Will add last 4 CBG results once components are confirmed  Assessment/Plan:  1. CV - Had 9 beats of  NSVT yesterday. Maintaining SR since then. On Lopressor 12.5 bid 2.  Pulmonary Encourage incentive spirometer 3.GI-nausea and bloated resolved. KUB yesterday showed mild ileus. Had a bowel movement 4.  Acute blood loss anemia - H and H stable at 10.9 and 32 5.Supplement potassium 6.DM-CBGs 110/107/121. Pre op HGA1C 7.4 Continue Metformin 500 daily. Will stop Levemir 7.Remove EPW  8.Home in am as patient has made arrangements  Neko Boyajian MPA-C 08/30/2012,7:26 AM

## 2012-08-30 NOTE — Progress Notes (Signed)
Pt ambulated 56ft w/ RW x 1 assist. Steady pace & gait. Will continue to monitor.

## 2012-08-30 NOTE — Progress Notes (Signed)
CARDIAC REHAB PHASE I   PRE:  Rate/Rhythm: 93 SR    BP: sitting 128/55    SaO2: 94 rA  MODE:  Ambulation: 390 ft   POST:  Rate/Rhythm: 100 ST    BP: sitting 114/65     SaO2: 93 RA  Tolerated fairly well. Encouraged to stand tall with RW. To BR then recliner (he sts he will do for 1 hr.)  1013-1048   Elissa Lovett Fidelis CES, ACSM 08/30/2012 10:46 AM

## 2012-08-30 NOTE — Progress Notes (Signed)
    SUBJECTIVE: Feeling better. Chest is sore. Constipation resolved.   BP 122/70  Pulse 77  Temp(Src) 98.6 F (37 C) (Oral)  Resp 18  Ht 6\' 1"  (1.854 m)  Wt 247 lb 12.8 oz (112.401 kg)  BMI 32.7 kg/m2  SpO2 98%  Intake/Output Summary (Last 24 hours) at 08/30/12 0745 Last data filed at 08/30/12 0500  Gross per 24 hour  Intake    483 ml  Output   1751 ml  Net  -1268 ml    PHYSICAL EXAM General: Well developed, well nourished, in no acute distress. Alert and oriented x 3.  Psych:  Good affect, responds appropriately Neck: No JVD. No masses noted.  Lungs: Clear bilaterally with no wheezes or rhonci noted.  Heart: RRR with no murmurs noted. Abdomen: Bowel sounds are present. Soft, non-tender.  Extremities: No lower extremity edema.   LABS: Basic Metabolic Panel:  Recent Labs  78/29/56 0400 08/29/12 0450  NA 134* 136  K 3.8 3.8  CL 98 99  CO2 28 26  GLUCOSE 112* 121*  BUN 24* 26*  CREATININE 0.80 0.77  CALCIUM 8.2* 8.2*   CBC:  Recent Labs  08/28/12 0400 08/29/12 0450  WBC 7.2 6.9  HGB 10.9* 10.9*  HCT 31.8* 32.0*  MCV 88.3 88.2  PLT 159 218   Current Meds: . aspirin EC  325 mg Oral Daily  . atorvastatin  80 mg Oral q1800  . buprenorphine  10 mcg Transdermal Weekly  . buPROPion  300 mg Oral Daily  . docusate sodium  200 mg Oral Daily  . enoxaparin (LOVENOX) injection  30 mg Subcutaneous QHS  . insulin aspart  0-24 Units Subcutaneous TID AC & HS  . lactulose  20 g Oral Once  . metFORMIN  500 mg Oral Q breakfast  . metoprolol tartrate  12.5 mg Oral BID   Or  . metoprolol tartrate  12.5 mg Per Tube BID  . pantoprazole  40 mg Oral QAC breakfast  . sodium chloride  3 mL Intravenous Q12H  . Vilazodone HCl  40 mg Oral Daily     ASSESSMENT AND PLAN:  1. 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 Troponin elevated c/w NSTEMI. Now s/p 5V CABG 08/25/12 per Dr. Tyrone Sage.  Doing well and progressing post bypass. He has been ambulating in the hallways. One run 9 beat NSVT yesterday. Continue ASA, statin, beta blocker.     MCALHANY,CHRISTOPHER  4/2/20147:45 AM

## 2012-08-31 LAB — GLUCOSE, CAPILLARY: Glucose-Capillary: 108 mg/dL — ABNORMAL HIGH (ref 70–99)

## 2012-08-31 MED ORDER — TRAMADOL HCL 50 MG PO TABS: 50.0000 mg | ORAL_TABLET | ORAL | Status: DC | PRN

## 2012-08-31 MED ORDER — OXYCODONE-ACETAMINOPHEN 10-325 MG PO TABS: 1.0000 | ORAL_TABLET | ORAL | Status: DC | PRN

## 2012-08-31 MED ORDER — METOPROLOL TARTRATE 25 MG PO TABS: 12.5000 mg | ORAL_TABLET | Freq: Two times a day (BID) | ORAL | Status: DC

## 2012-08-31 MED ORDER — ASPIRIN 325 MG PO TBEC: 325.0000 mg | DELAYED_RELEASE_TABLET | Freq: Every day | ORAL | Status: DC

## 2012-08-31 MED ORDER — ATORVASTATIN CALCIUM 80 MG PO TABS: 80.0000 mg | ORAL_TABLET | Freq: Every day | ORAL | Status: DC

## 2012-08-31 NOTE — Progress Notes (Signed)
                   [address]            [place] [address]          [phone]      6 Days Post-Op Procedure(s) (LRB): CORONARY ARTERY BYPASS GRAFTING (CABG) (N/A) INTRAOPERATIVE TRANSESOPHAGEAL ECHOCARDIOGRAM (N/A)  Subjective: Patient feeling well this morning.  Objective: Vital signs in last 24 hours: Temp:  [98 F (36.7 C)-98.9 F (37.2 C)] 98 F (36.7 C) (04/03 0403) Pulse Rate:  [71-89] 71 (04/03 0403) Cardiac Rhythm:  [-] Normal sinus rhythm (04/03 0403) Resp:  [18] 18 (04/03 0403) BP: (99-128)/(55-68) 109/68 mmHg (04/03 0403) SpO2:  [92 %-99 %] 99 % (04/03 0403) Weight:  [112.3 kg (247 lb 9.2 oz)] 112.3 kg (247 lb 9.2 oz) (04/03 0403)  Pre op weight  115.9 kg Current Weight  08/31/12 112.3 kg (247 lb 9.2 oz)      Intake/Output from previous day: 04/02 0701 - 04/03 0700 In: 6 [I.V.:6] Out: 600 [Urine:600]   Physical Exam:  Cardiovascular: RRR Pulmonary: Mostly clear; no rales, wheezes, or rhonchi. Abdomen: Soft, non tender, some distention,bowel sounds present. Extremities:Tracebilateral lower extremity edema. Wounds:Clean and dry, no signs of infection  Lab Results: CBC:  Recent Labs  08/29/12 0450  WBC 6.9  HGB 10.9*  HCT 32.0*  PLT 218   BMET:   Recent Labs  08/29/12 0450  NA 136  K 3.8  CL 99  CO2 26  GLUCOSE 121*  BUN 26*  CREATININE 0.77  CALCIUM 8.2*    PT/INR:  Lab Results  Component Value Date   INR 1.29 08/25/2012   INR 0.94 08/23/2012   INR 0.97 08/12/2010   ABG:  INR: Will add last result for INR, ABG once components are confirmed Will add last 4 CBG results once components are confirmed  Assessment/Plan:  1. CV -  Maintaining SR since then. On Lopressor 12.5 bid 2.  Pulmonary Encourage incentive spirometer 3.  Acute blood loss anemia - H and H stable at 10.9 and 32 4.DM-CBGs 98/115/108. Pre op HGA1C 7.4 Continue Metformin 500 daily.  5.Discharge today  Veverly Larimer  MPA-C 08/31/2012,7:30 AM

## 2012-08-31 NOTE — Progress Notes (Signed)
Chest tube sutures d/c per MD order. Cleansed with benzoin and steri strips applied. Dion Saucier

## 2012-08-31 NOTE — Progress Notes (Signed)
Patient discharge instructions and education completed. IV site d/c. Site WNL.

## 2012-08-31 NOTE — Progress Notes (Signed)
Pt had no further questions or concerns. Discharged home with wife. Timothy Cervantes

## 2012-08-31 NOTE — Progress Notes (Addendum)
    SUBJECTIVE: Feels great this morning. No chest pain or SOB. No abdominal pain.   BP 109/68  Pulse 71  Temp(Src) 98 F (36.7 C) (Oral)  Resp 18  Ht 6\' 1"  (1.854 m)  Wt 247 lb 9.2 oz (112.3 kg)  BMI 32.67 kg/m2  SpO2 99%  Intake/Output Summary (Last 24 hours) at 08/31/12 0743 Last data filed at 08/30/12 2141  Gross per 24 hour  Intake      6 ml  Output    600 ml  Net   -594 ml    PHYSICAL EXAM General: Well developed, well nourished, in no acute distress. Alert and oriented x 3.  Psych:  Good affect, responds appropriately Neck: No JVD. No masses noted.  Lungs: Clear bilaterally with no wheezes or rhonci noted.  Heart: RRR with no murmurs noted. Abdomen: Bowel sounds are present. Soft, non-tender.  Extremities: No lower extremity edema.   LABS: Basic Metabolic Panel:  Recent Labs  16/10/96 0450  NA 136  K 3.8  CL 99  CO2 26  GLUCOSE 121*  BUN 26*  CREATININE 0.77  CALCIUM 8.2*   CBC:  Recent Labs  08/29/12 0450  WBC 6.9  HGB 10.9*  HCT 32.0*  MCV 88.2  PLT 218   Current Meds: . aspirin EC  325 mg Oral Daily  . atorvastatin  80 mg Oral q1800  . buprenorphine  10 mcg Transdermal Weekly  . buPROPion  300 mg Oral Daily  . docusate sodium  200 mg Oral Daily  . enoxaparin (LOVENOX) injection  30 mg Subcutaneous QHS  . insulin aspart  0-24 Units Subcutaneous TID AC & HS  . lactulose  20 g Oral Once  . metFORMIN  500 mg Oral Q breakfast  . metoprolol tartrate  12.5 mg Oral BID   Or  . metoprolol tartrate  12.5 mg Per Tube BID  . pantoprazole  40 mg Oral QAC breakfast  . sodium chloride  3 mL Intravenous Q12H  . Vilazodone HCl  40 mg Oral Daily     ASSESSMENT AND PLAN:  1. 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 Troponin elevated c/w NSTEMI. Now s/p 5V CABG 08/25/12 per Dr. Tyrone Sage. Doing well and progressing post bypass. He has been ambulating in the  hallways. Rhythm stable. Continue ASA, statin, beta blocker. No Ace-inh/ARB post MI with relative hypotension. Will consider adding at f/u appt if BP stable.  Probable d/c home today. He can f/u with me in 2-3 weeks.      MCALHANY,CHRISTOPHER  4/3/20147:43 AM

## 2012-08-31 NOTE — Progress Notes (Signed)
Ed completed with wife present. Voiced understanding. Interested in [facility] and requests his name be sent to [place] [facility]. Spoke with pt and wife re tobacco abuse. Resistant to discussion but sts he will quit. [id number] Ethelda Chick CES, ACSM 08/31/2012 10:30 AM

## 2012-09-18 ENCOUNTER — Encounter: Payer: Self-pay | Admitting: *Deleted

## 2012-09-19 ENCOUNTER — Ambulatory Visit (INDEPENDENT_AMBULATORY_CARE_PROVIDER_SITE_OTHER): Payer: Medicare Other | Admitting: Physician Assistant

## 2012-09-19 ENCOUNTER — Ambulatory Visit (INDEPENDENT_AMBULATORY_CARE_PROVIDER_SITE_OTHER): Payer: Self-pay | Admitting: Cardiothoracic Surgery

## 2012-09-19 ENCOUNTER — Encounter: Payer: Self-pay | Admitting: Physician Assistant

## 2012-09-19 ENCOUNTER — Encounter: Payer: Self-pay | Admitting: Cardiothoracic Surgery

## 2012-09-19 VITALS — BP 140/81 | HR 86 | Resp 16 | Ht 73.0 in | Wt 235.0 lb

## 2012-09-19 VITALS — BP 114/64 | HR 67 | Ht 73.0 in | Wt 240.8 lb

## 2012-09-19 DIAGNOSIS — I2510 Atherosclerotic heart disease of native coronary artery without angina pectoris: Secondary | ICD-10-CM

## 2012-09-19 DIAGNOSIS — I10 Essential (primary) hypertension: Secondary | ICD-10-CM

## 2012-09-19 DIAGNOSIS — IMO0002 Reserved for concepts with insufficient information to code with codable children: Secondary | ICD-10-CM

## 2012-09-19 DIAGNOSIS — E785 Hyperlipidemia, unspecified: Secondary | ICD-10-CM

## 2012-09-19 DIAGNOSIS — Z951 Presence of aortocoronary bypass graft: Secondary | ICD-10-CM | POA: Insufficient documentation

## 2012-09-19 NOTE — Progress Notes (Signed)
[address]            [place] [address]          [phone]       Timothy Cervantes [facility] Medical Record #[id number] Date of Birth: 1940/10/19  Timothy Old, MD  Chief Complaint:   PostOp Follow Up Visit 08/25/2012   OPERATIVE REPORT  PREOPERATIVE DIAGNOSIS: Recent non-ST elevation myocardial infarction.  POSTOPERATIVE DIAGNOSIS: Recent non-ST elevation myocardial infarction  with anatomic evidence of transmural inferior myocardial infarction.  PROCEDURE PERFORMED: Coronary artery bypass grafting x5 with the left  internal mammary to the left anterior descending coronary artery,  reverse saphenous vein graft to the diagonal coronary artery, sequential  reverse saphenous vein graft to the first and second obtuse marginal,  and a reverse saphenous vein graft to the posterior descending coronary  artery with right thigh and calf and left thigh and endo vein  harvesting.  SURGEON: Timothy Plane, MD   History of Present Illness:      Patient returns to the office today after coronary artery bypass grafting March 28. He presented with non-ST elevation myocardial infarction. Postoperative his course has been unremarkable. He is limited by his chronic back pain and fibromyalgia in his activity level. He has had no evidence of congestive heart failure or recurrent angina.      History  Smoking status  . Former Smoker -- 1.00 packs/day for 57 years  . Types: Cigarettes  Smokeless tobacco  . Current User  . Types: Chew    Comment: smoked from the age of 49.  Quit 03/2012       No Known Allergies  Current Outpatient Prescriptions  Medication Sig Dispense Refill  . aspirin EC 325 MG EC tablet Take 1 tablet (325 mg total) by mouth daily.  30 tablet    . atorvastatin (LIPITOR) 80 MG tablet Take 1 tablet (80 mg total) by mouth daily at 6 PM.  30 tablet  1  . buprenorphine (BUTRANS - DOSED MCG/HR) 10 MCG/HR  PTWK Place 10 mcg onto the skin once a week.      [name] buPROPion (WELLBUTRIN XL) 300 MG 24 hr tablet Take 300 mg by mouth daily.      . cholecalciferol (VITAMIN D) 1000 UNITS tablet Take 1,000 Units by mouth daily.      . metFORMIN (GLUCOPHAGE) 500 MG tablet Take 500 mg by mouth daily.      . metoprolol tartrate (LOPRESSOR) 25 MG tablet Take 0.5 tablets (12.5 mg total) by mouth 2 (two) times daily.  30 tablet  1  . oxyCODONE-acetaminophen (PERCOCET) 10-325 MG per tablet Take 1 tablet by mouth every 4 (four) hours as needed for pain.  30 tablet  0  . pantoprazole (PROTONIX) 40 MG tablet Take 40 mg by mouth daily.      . temazepam (RESTORIL) 15 MG capsule Take 15 mg by mouth at bedtime as needed.       . Vilazodone HCl (VIIBRYD) 40 MG TABS Take 40 mg by mouth daily.       No current facility-administered medications for this visit.       Physical Exam: BP 140/81  Pulse 86  Resp 16  Ht 6\' 1"  (1.854 m)  Wt 235 lb (106.595 kg)  BMI 31.01 kg/m2  SpO2 98%  General appearance: alert, cooperative and no distress Neurologic: intact Heart:  regular rate and rhythm, S1, S2 normal, no murmur, click, rub or gallop and normal apical impulse Lungs: clear to auscultation bilaterally and normal percussion bilaterally Abdomen: soft, non-tender; bowel sounds normal; no masses,  no organomegaly Extremities: extremities normal, atraumatic, no cyanosis or edema, Homans sign is negative, no sign of DVT and no edema, redness or tenderness in the calves or thighs Wound: The patient sternal incision is well-healed in stable, at the right end of vein harvest site there is approximately 2 cm lymphocele nontender and without erythema or evidence of infection   Diagnostic Studies & Laboratory data:         Recent Radiology Findings: No results found.    Recent Labs: Lab Results  Component Value Date   WBC 6.9 08/29/2012   HGB 10.9* 08/29/2012   HCT 32.0* 08/29/2012   PLT 218 08/29/2012   GLUCOSE 121* 08/29/2012    CHOL 159 08/24/2012   TRIG 108 08/24/2012   HDL 49 08/24/2012   LDLCALC 88 08/24/2012   ALT 49 08/24/2012   AST 61* 08/24/2012   NA 136 08/29/2012   K 3.8 08/29/2012   CL 99 08/29/2012   CREATININE 0.77 08/29/2012   BUN 26* 08/29/2012   CO2 26 08/29/2012   INR 1.29 08/25/2012   HGBA1C 7.4* 08/24/2012      Assessment / Plan:      Patient making good progress following coronary artery bypass grafting Is developed a small lymphocele at the distal end of his right end of vein harvest site. Approximately 20 cc of straw-colored noninfected appearing lymph fluid was aspirated from the area without difficulty with complete resolution, I discussed with the patient that may recur but to keep a pressure dressing on the area for at least 24 hours. He'll keep his previously made appointment on May 5 for routine followup. Because of his chronic back pain and difficulty ambulating more than 200 feet he requested a handicap sticker which was signed for him.   Timothy Cervantes B 09/19/2012 2:01 PM

## 2012-09-19 NOTE — Progress Notes (Signed)
[address], [address] [place], [place]  [address] Phone: [phone] Fax:  [phone]  Date:  09/19/2012   ID:  Nat, Lowenthal Dec 28, 1940, MRN [id number]  PCP:  [name], MD  Primary Cardiologist:  Dr. Verne Carrow     History of Present Illness: Timothy Cervantes is a 72 y.o. male who returns for follow up after a recent admission to the hospital 3/26-4/3 with a non-STEMI followed by CABG.  He has a history of DM2, HTN, tobacco abuse. He presented after developing sudden onset of diaphoresis, jaw pain radiating into his chest and his left arm. LHC 08/23/12 demonstrated severe 3 vessel CAD with preserved LV function.  Echo 08/24/12:  Mild LVH, EF 65-70%, mild RVE, PASP 36.  He underwent consultation with [facility] (Dr. Tyrone Sage). Preoperative carotid Dopplers were negative for ICA stenosis. He underwent CABG 08/25/12 (LIMA-LAD, SVG-DX, SVG-OM1/OM2, SVG-PDA). Postoperative course was fairly uneventful. He remained in sinus rhythm.  He has been doing well since discharge. He did sneeze 3 days ago and pulled muscle in his left chest. This seems to be improving. He also notes some swelling around the vein graft harvesting site in his right leg. He denies any other anginal symptoms. He denies significant dyspnea. He is NYHA class II. He denies orthopnea, PND or ankle edema. He denies syncope.  Labs (4/14):  K 3.8, Cr 0.77, LDL 88, Hgb 10.9   Wt Readings from Last 3 Encounters:  09/19/12 240 lb 12.8 oz (109.226 kg)  08/31/12 247 lb 9.2 oz (112.3 kg)  08/31/12 247 lb 9.2 oz (112.3 kg)     Past Medical History  Diagnosis Date  . Diabetes mellitus without complication     a. Dx in 2010  . Hypertension     a. Dx in 2010  . Back pain, chronic   . DJD (degenerative joint disease)   . History of tobacco abuse     a. 57 year hx, quit 03/2012.  [name] Chronic pain   . Fibromyalgia   . CAD (coronary artery disease)     a. s/p NSTEMI 07/2012 => LHC with 3v CAD => s/p CABG  08/25/12 (LIMA-LAD, SVG-DX, SVG-OM1/OM2, SVG-PDA) Dr. Tyrone Sage;  b.  Echo 08/24/12:  Mild LVH, EF 65-70%, mild RVE, PASP 36  . Chronic back pain   . HLD (hyperlipidemia)     Current Outpatient Prescriptions  Medication Sig Dispense Refill  . aspirin EC 325 MG EC tablet Take 1 tablet (325 mg total) by mouth daily.  30 tablet    . atorvastatin (LIPITOR) 80 MG tablet Take 1 tablet (80 mg total) by mouth daily at 6 PM.  30 tablet  1  . buprenorphine (BUTRANS - DOSED MCG/HR) 10 MCG/HR PTWK Place 10 mcg onto the skin once a week.      [name] buPROPion (WELLBUTRIN XL) 300 MG 24 hr tablet Take 300 mg by mouth daily.      . cholecalciferol (VITAMIN D) 1000 UNITS tablet Take 1,000 Units by mouth daily.      . metFORMIN (GLUCOPHAGE) 500 MG tablet Take 500 mg by mouth daily.      . metoprolol tartrate (LOPRESSOR) 25 MG tablet Take 0.5 tablets (12.5 mg total) by mouth 2 (two) times daily.  30 tablet  1  . oxyCODONE-acetaminophen (PERCOCET) 10-325 MG per tablet Take 1 tablet by mouth every 4 (four) hours as needed for pain.  30 tablet  0  . pantoprazole (PROTONIX) 40 MG tablet Take 40 mg by mouth  daily.      . temazepam (RESTORIL) 15 MG capsule Take 15 mg by mouth at bedtime as needed.       . Vilazodone HCl (VIIBRYD) 40 MG TABS Take 40 mg by mouth daily.       No current facility-administered medications for this visit.    Allergies:   No Known Allergies  Social History:  The patient  reports that he has quit smoking. His smoking use included Cigarettes. He has a 57 pack-year smoking history. His smokeless tobacco use includes Chew. He reports that he does not drink alcohol or use illicit drugs.   ROS:  Please see the history of present illness.   No fevers, chills, cough. He does have episodic diaphoresis. This seems to be improving since discharge.   All other systems reviewed and negative.   PHYSICAL EXAM: VS:  BP 114/64  Pulse 67  Ht 6\' 1"  (1.854 m)  Wt 240 lb 12.8 oz (109.226 kg)  BMI 31.78  kg/m2 Well nourished, well developed, in no acute distress HEENT: normal Neck: no JVD Chest: Median sternotomy scar well-healed without erythema or discharge; sternum intact Cardiac:  normal S1, S2; RRR; no murmur Lungs:  clear to auscultation bilaterally, no wheezing, rhonchi or rales Abd: soft, nontender, no hepatomegaly Ext: no edema; right wrist without hematoma or bruit;  there is a large cystic structure (somewhat larger than a walnut size) around the vein graft harvesting site on his right leg at the level of his knee that is fluctuant and somewhat tender, there is no erythema Skin: warm and dry Neuro:  CNs 2-12 intact, no focal abnormalities noted  EKG:  NSR, HR 67, LAD, nonspecific ST-T wave changes     ASSESSMENT AND PLAN:  1. CAD:  Doing well after recent CABG. He had several questions today and I tried to answer all of them. He is waiting to hear back from cardiac rehabilitation. He would continue aspirin and statin. 2. Hypertension:  Controlled. Continue current therapy. 3. Hyperlipidemia:  Continue statin. Check Lipids and LFTs in 6 weeks.   4. Seroma:  He has a cystic structure at the right leg vein harvesting site. This is fluctuant. It does not appear to be infected. Question if this should be drained. I will arrange follow up at Dr. Dennie Maizes office for evaluation. 5. Disposition:  Follow up with Dr. Verne Carrow in 6 weeks.    Signed, Tereso Newcomer, PA-C  9:17 AM 09/19/2012

## 2012-09-19 NOTE — Patient Instructions (Addendum)
FASTING LIPID AND LIVER PANEL TO BE DONE IN 6 WEEKS  PLEASE FOLLOW UP WITH DR. Clifton James IN 6 WEEKS  PLEASE FOLLOW UP WITH DR. Dennie Maizes OFFICE TODAY AT 1 PM FOR SEROMA ON RIGHT LEG

## 2012-09-20 ENCOUNTER — Encounter: Payer: Self-pay | Admitting: Gastroenterology

## 2012-09-26 ENCOUNTER — Encounter: Payer: Medicare Other | Admitting: Physician Assistant

## 2012-09-26 ENCOUNTER — Other Ambulatory Visit: Payer: Self-pay | Admitting: *Deleted

## 2012-09-26 DIAGNOSIS — I2510 Atherosclerotic heart disease of native coronary artery without angina pectoris: Secondary | ICD-10-CM

## 2012-09-28 ENCOUNTER — Encounter: Payer: Self-pay | Admitting: Cardiothoracic Surgery

## 2012-09-28 ENCOUNTER — Ambulatory Visit (INDEPENDENT_AMBULATORY_CARE_PROVIDER_SITE_OTHER): Payer: Self-pay | Admitting: Cardiothoracic Surgery

## 2012-09-28 ENCOUNTER — Ambulatory Visit
Admission: RE | Admit: 2012-09-28 | Discharge: 2012-09-28 | Disposition: A | Discharge status: Home / Self Care | Payer: Medicare Other | Source: Ambulatory Visit | Attending: Cardiothoracic Surgery | Admitting: Cardiothoracic Surgery

## 2012-09-28 VITALS — BP 115/69 | HR 67 | Resp 18 | Ht 73.0 in | Wt 235.0 lb

## 2012-09-28 DIAGNOSIS — I2510 Atherosclerotic heart disease of native coronary artery without angina pectoris: Secondary | ICD-10-CM

## 2012-09-28 DIAGNOSIS — Z951 Presence of aortocoronary bypass graft: Secondary | ICD-10-CM

## 2012-09-28 MED ORDER — HYDROCODONE-ACETAMINOPHEN 7.5-300 MG PO TABS: 1.0000 | ORAL_TABLET | Freq: Three times a day (TID) | ORAL | Status: DC | PRN

## 2012-09-28 NOTE — Progress Notes (Addendum)
[address]            [place] [address]          [phone]       Timothy Cervantes [facility] Medical Record #[id number] Date of Birth: 1940-10-14  [name], MD  Chief Complaint:   PostOp Follow Up Visit 08/25/2012  OPERATIVE REPORT  PREOPERATIVE DIAGNOSIS: Recent non-ST elevation myocardial infarction.  POSTOPERATIVE DIAGNOSIS: Recent non-ST elevation myocardial infarction  with anatomic evidence of transmural inferior myocardial infarction.  PROCEDURE PERFORMED: Coronary artery bypass grafting x5 with the left  internal mammary to the left anterior descending coronary artery,  reverse saphenous vein graft to the diagonal coronary artery, sequential  reverse saphenous vein graft to the first and second obtuse marginal,  and a reverse saphenous vein graft to the posterior descending coronary  artery with right thigh and calf and left thigh and endo vein  harvesting.   History of Present Illness:     Patient returns for followup visit after coronary artery bypass grafting March 28. Postop he had some seroma develop just below the right knee which was aspirated. This now appears stable and is not bothering. He's increasing his physical activity appropriately. He's not had no symptoms of congestive heart failure or recurrent angina.          History  Smoking status  . Former Smoker -- 1.00 packs/day for 57 years  . Types: Cigarettes  Smokeless tobacco  . Current User  . Types: Chew    Comment: smoked from the age of 57.  Quit 03/2012       No Known Allergies  Current Outpatient Prescriptions  Medication Sig Dispense Refill  . aspirin EC 325 MG EC tablet Take 1 tablet (325 mg total) by mouth daily.  30 tablet    . atorvastatin (LIPITOR) 80 MG tablet Take 1 tablet (80 mg total) by mouth daily at 6 PM.  30 tablet  1  . buprenorphine (BUTRANS - DOSED MCG/HR) 10 MCG/HR PTWK Place 10 mcg onto the  skin once a week.      [name] buPROPion (WELLBUTRIN XL) 300 MG 24 hr tablet Take 300 mg by mouth daily.      . cholecalciferol (VITAMIN D) 1000 UNITS tablet Take 1,000 Units by mouth daily.      . metFORMIN (GLUCOPHAGE) 500 MG tablet Take 500 mg by mouth daily.      . metoprolol tartrate (LOPRESSOR) 25 MG tablet Take 0.5 tablets (12.5 mg total) by mouth 2 (two) times daily.  30 tablet  1  . oxyCODONE-acetaminophen (PERCOCET) 10-325 MG per tablet Take 1 tablet by mouth every 4 (four) hours as needed for pain.  30 tablet  0  . pantoprazole (PROTONIX) 40 MG tablet Take 40 mg by mouth daily.      . temazepam (RESTORIL) 15 MG capsule Take 15 mg by mouth at bedtime as needed.       . Vilazodone HCl (VIIBRYD) 40 MG TABS Take 40 mg by mouth daily.       No current facility-administered medications for this visit.       Physical Exam: BP 115/69  Pulse 67  Resp 18  Ht 6\' 1"  (1.854 m)  Wt 235 lb (106.595 kg)  BMI 31.01 kg/m2  SpO2 97%  General appearance: alert and cooperative Neurologic: intact Heart: regular rate and rhythm, S1,  S2 normal, no murmur, click, rub or gallop and normal apical impulse Lungs: clear to auscultation bilaterally and normal percussion bilaterally Abdomen: soft, non-tender; bowel sounds normal; no masses,  no organomegaly Extremities: extremities normal, atraumatic, no cyanosis or edema and Homans sign is negative, no sign of DVT Wound: healing well   Diagnostic Studies & Laboratory data:         Recent Radiology Findings: Dg Chest 2 View  09/28/2012  *RADIOLOGY REPORT*  Clinical Data: CAD, CABG.  CHEST - 2 VIEW  Comparison: 08/29/2012  Findings: Prior CABG.  Heart is upper limits normal in size. Improving aeration in the lung base with decreasing left base atelectasis and effusion.  Small left effusion and minimal atelectasis persist.  Right lung is clear.  No acute bony abnormality. No pneumothorax.  IMPRESSION: Improving left base atelectasis and small left effusion.    Original Report Authenticated By: Charlett Nose, M.D.       Recent Labs: Lab Results  Component Value Date   WBC 6.9 08/29/2012   HGB 10.9* 08/29/2012   HCT 32.0* 08/29/2012   PLT 218 08/29/2012   GLUCOSE 121* 08/29/2012   CHOL 159 08/24/2012   TRIG 108 08/24/2012   HDL 49 08/24/2012   LDLCALC 88 08/24/2012   ALT 49 08/24/2012   AST 61* 08/24/2012   NA 136 08/29/2012   K 3.8 08/29/2012   CL 99 08/29/2012   CREATININE 0.77 08/29/2012   BUN 26* 08/29/2012   CO2 26 08/29/2012   INR 1.29 08/25/2012   HGBA1C 7.4* 08/24/2012      Assessment / Plan:      Patient doing well following coronary bypass grafting I discussed with him starting in cardiac rehabilitation in the near future I plan to see him back in 6 weeks to check the status of his vein harvest site       Kimmi Acocella B 09/28/2012 4:00 PM

## 2012-09-28 NOTE — Addendum Note (Signed)
Addended by: Sheliah Plane B on: 09/28/2012 04:32 PM   Modules accepted: Orders

## 2012-09-28 NOTE — Patient Instructions (Signed)
[address] No lifting over 25 lbs for 3 months  Coronary Artery Bypass Grafting  Care After  Refer to this sheet in the next few weeks. These instructions provide you with information on caring for yourself after your procedure. Your caregiver may also give you more specific instructions. Your treatment has been planned according to current medical practices, but problems sometimes occur. Call your caregiver if you have any problems or questions after your procedure.  Recovery from open heart surgery will be different for everyone. Some people feel well after 3 or 4 weeks, while for others it takes longer. After heart surgery, it may be normal to:  Not have an appetite, feel nauseated by the smell of food, or only want to eat a small amount.   Be constipated because of changes in your diet, activity, and medicines. Eat foods high in fiber. Add fresh fruits and vegetables to your diet. Stool softeners may be helpful.   Feel sad or unhappy. You may be frustrated or cranky. You may have good days and bad days. Do not give up. Talk to your caregiver if you do not feel better.   Feel weakness and fatigue. You many need physical therapy or cardiac rehabilitation to get your strength back.   Develop an irregular heartbeat called atrial fibrillation. Symptoms of atrial fibrillation are a fast, irregular heartbeat or feelings of fluttery heartbeats, shortness of breath, low blood pressure, and dizziness. If these symptoms develop, see your caregiver right away.  MEDICATION  Have a list of all the medicines you will be taking when you leave the hospital. For every medicine, know the following:   Name.   Exact dose.   Time of day to be taken.   How often it should be taken.   Why you are taking it.   Ask which medicines should or should not be taken together. If you take more than one heart medicine, ask if it is okay to take them together. Some heart medicines should not be taken at the same  time because they may lower your blood pressure too much.   Narcotic pain medicine can cause constipation. Eat fresh fruits and vegetables. Add fiber to your diet. Stool softener medicine may help relieve constipation.   Keep a copy of your medicines with you at all times.   Do not add or stop taking any medicine until you check with your caregiver.   Medicines can have side effects. Call your caregiver who prescribed the medicine if you:   Start throwing up, have diarrhea, or have stomach pain.   Feel dizzy or lightheaded when you stand up.   Feel your heart is skipping beats or is beating too fast or too slow.   Develop a rash.   Notice unusual bruising or bleeding.  HOME CARE INSTRUCTIONS  After heart surgery, it is important to learn how to take your pulse. Have your caregiver show you how to take your pulse.   Use your incentive spirometer. Ask your caregiver how long after surgery you need to use it.  Care of your chest incision  Tell your caregiver right away if you notice clicking in your chest (sternum).   Support your chest with a pillow or your arms when you take deep breaths and cough.   Follow your caregiver's instructions about when you can bathe or swim.   Protect your incision from sunlight during the first year to keep the scar from getting dark.   Tell your caregiver if  you notice:   Increased tenderness of your incision.   Increased redness or swelling around your incision.   Drainage or pus from your incision.  Care of your leg incision(s)  Avoid crossing your legs.   Avoid sitting for long periods of time. Change positions every half hour.   Elevate your leg(s) when you are sitting.   Check your leg(s) daily for swelling. Check the incisions for redness or drainage.   Diet is very important to heart health.   Eat plenty of fresh fruits and vegetables. Meats should be lean cut. Avoid canned, processed, and fried foods.   Talk to a dietician.  They can teach you how to make healthy food and drink choices.  Weight  Weigh yourself every day. This is important because it helps to know if you are retaining fluid that may make your heart and lungs work harder.   Use the same scale each time.   Weigh yourself every morning at the same time. You should do this after you go to the bathroom, but before you eat breakfast.   Your weight will be more accurate if you do not wear any clothes.   Record your weight.   Tell your caregiver if you have gained 2 pounds or more overnight.  Activity Stop any activity at once if you have chest pain, shortness of breath, irregular heartbeats, or dizziness. Get help right away if you have any of these symptoms.  Bathing.  Avoid soaking in a bath or hot tub until your incisions are healed.   Rest. You need a balance of rest and activity.   Exercise. Exercise per your caregiver's advice. You may need physical therapy or cardiac rehabilitation to help strengthen your muscles and build your endurance.   Climbing stairs. Unless your caregiver tells you not to climb stairs, go up stairs slowly and rest if you tire. Do not pull yourself up by the handrail.   Driving a car. Follow your caregiver's advice on when you may drive. You may ride as a passenger at any time. When traveling for long periods of time in a car, get out of the car and walk around for a few minutes every 2 hours.   Lifting. Avoid lifting, pushing, or pulling anything heavier than 10 pounds for 6 weeks after surgery or as told by your caregiver.   Returning to work. Check with your caregiver. People heal at different rates. Most people will be able to go back to work 6 to 12 weeks after surgery.   Sexual activity. You may resume sexual relations as told by your caregiver.  SEEK MEDICAL CARE IF:  Any of your incisions are red, painful, or have any type of drainage coming from them.   You have an oral temperature above 101.5 F .    You have ankle or leg swelling.   You have pain in your legs.   You have weight gain of 2 or more pounds a day.   You feel dizzy or lightheaded when you stand up.  SEEK IMMEDIATE MEDICAL CARE IF:  You have angina or chest pain that goes to your jaw or arms. Call your local emergency services right away.   You have shortness of breath at rest or with activity.   You have a fast or irregular heartbeat (arrhythmia).   There is a "clicking" in your sternum when you move.   You have numbness or weakness in your arms or legs.  MAKE SURE YOU:  Understand  these instructions.   Will watch your condition.   Will get help right away if you are not doing well or get worse.    No lifting over 25 lbs for 3 months

## 2012-10-04 ENCOUNTER — Other Ambulatory Visit: Payer: Self-pay | Admitting: Physician Assistant

## 2012-10-05 ENCOUNTER — Encounter (HOSPITAL_COMMUNITY)
Admission: RE | Admit: 2012-10-05 | Discharge: 2012-10-05 | Disposition: A | Discharge status: Home / Self Care | Payer: Medicare Other | Source: Ambulatory Visit | Attending: Cardiovascular Disease | Admitting: Cardiovascular Disease

## 2012-10-05 DIAGNOSIS — I214 Non-ST elevation (NSTEMI) myocardial infarction: Secondary | ICD-10-CM | POA: Insufficient documentation

## 2012-10-05 DIAGNOSIS — Z5189 Encounter for other specified aftercare: Secondary | ICD-10-CM | POA: Insufficient documentation

## 2012-10-05 DIAGNOSIS — I2510 Atherosclerotic heart disease of native coronary artery without angina pectoris: Secondary | ICD-10-CM | POA: Insufficient documentation

## 2012-10-05 DIAGNOSIS — I10 Essential (primary) hypertension: Secondary | ICD-10-CM | POA: Insufficient documentation

## 2012-10-05 NOTE — Progress Notes (Signed)
Cardiac Rehab Medication Review by a Pharmacist  Does the patient  feel that his/her medications are working for him/her?  yes  Has the patient been experiencing any side effects to the medications prescribed?  no  Does the patient measure his/her own blood pressure or blood glucose at home?  yes   Does the patient have any problems obtaining medications due to transportation or finances?   no  Understanding of regimen: poor Understanding of indications: poor Potential of compliance: fair    Pharmacist comments: Patient has very little interest in learning about his medications. He has a wife that is an Charity fundraiser and relies on her to sort and know his medications for him. She was not at the visit so it was hard for him to speak to what he is taking outside of the list. He does say that he forgets his meds ~1x per week but this is better than it was previously.    Drue Stager 10/05/2012 8:21 AM

## 2012-10-09 ENCOUNTER — Encounter (HOSPITAL_COMMUNITY)
Admission: RE | Admit: 2012-10-09 | Discharge: 2012-10-09 | Disposition: A | Discharge status: Home / Self Care | Payer: Medicare Other | Source: Ambulatory Visit | Attending: Cardiovascular Disease | Admitting: Cardiovascular Disease

## 2012-10-09 LAB — GLUCOSE, CAPILLARY
Glucose-Capillary: 115 mg/dL — ABNORMAL HIGH (ref 70–99)
Glucose-Capillary: 142 mg/dL — ABNORMAL HIGH (ref 70–99)

## 2012-10-09 NOTE — Progress Notes (Signed)
Pt started cardiac rehab today.  Pt tolerated light exercise without difficulty.  Hypotensive with exercise, lowest BP-84/60 post exercise. Pt asymptomatic, given water with recheck 105/69.  Telemetry-sinus rhythm.  Pt oriented to exercise equipment and routine.  Understanding verbalized.

## 2012-10-11 ENCOUNTER — Ambulatory Visit (HOSPITAL_COMMUNITY)
Admission: RE | Admit: 2012-10-11 | Discharge: 2012-10-11 | Disposition: A | Discharge status: Home / Self Care | Payer: Medicare Other | Source: Ambulatory Visit | Attending: Cardiovascular Disease | Admitting: Cardiovascular Disease

## 2012-10-11 ENCOUNTER — Ambulatory Visit (INDEPENDENT_AMBULATORY_CARE_PROVIDER_SITE_OTHER): Payer: Medicare Other | Admitting: Cardiovascular Disease

## 2012-10-11 ENCOUNTER — Telehealth: Payer: Self-pay | Admitting: *Deleted

## 2012-10-11 ENCOUNTER — Encounter (HOSPITAL_COMMUNITY)
Admission: RE | Admit: 2012-10-11 | Discharge: 2012-10-11 | Disposition: A | Discharge status: Home / Self Care | Payer: Medicare Other | Source: Ambulatory Visit | Attending: Cardiovascular Disease | Admitting: Cardiovascular Disease

## 2012-10-11 ENCOUNTER — Encounter (HOSPITAL_COMMUNITY): Payer: Self-pay

## 2012-10-11 ENCOUNTER — Encounter: Payer: Self-pay | Admitting: Cardiovascular Disease

## 2012-10-11 VITALS — BP 110/70 | HR 74 | Ht 73.0 in | Wt 246.8 lb

## 2012-10-11 DIAGNOSIS — I509 Heart failure, unspecified: Secondary | ICD-10-CM | POA: Insufficient documentation

## 2012-10-11 DIAGNOSIS — I5033 Acute on chronic diastolic (congestive) heart failure: Secondary | ICD-10-CM | POA: Insufficient documentation

## 2012-10-11 DIAGNOSIS — I25810 Atherosclerosis of coronary artery bypass graft(s) without angina pectoris: Secondary | ICD-10-CM

## 2012-10-11 DIAGNOSIS — J984 Other disorders of lung: Secondary | ICD-10-CM | POA: Insufficient documentation

## 2012-10-11 DIAGNOSIS — J9819 Other pulmonary collapse: Secondary | ICD-10-CM | POA: Insufficient documentation

## 2012-10-11 DIAGNOSIS — Z951 Presence of aortocoronary bypass graft: Secondary | ICD-10-CM | POA: Insufficient documentation

## 2012-10-11 DIAGNOSIS — R0602 Shortness of breath: Secondary | ICD-10-CM

## 2012-10-11 DIAGNOSIS — I10 Essential (primary) hypertension: Secondary | ICD-10-CM | POA: Insufficient documentation

## 2012-10-11 DIAGNOSIS — E119 Type 2 diabetes mellitus without complications: Secondary | ICD-10-CM | POA: Insufficient documentation

## 2012-10-11 MED ORDER — FUROSEMIDE 40 MG PO TABS: 40.0000 mg | ORAL_TABLET | Freq: Every day | ORAL | Status: DC

## 2012-10-11 NOTE — Telephone Encounter (Signed)
Received call from [place] at cardiac rehab. Pt there today and weight is up 4 lbs since Monday. Pt complaining of feeling weak and tired. Aurea Graff is asking if pt can be seen in office today.  Appt made for pt to see Dr. Clifton James at 3:00 today

## 2012-10-11 NOTE — Patient Instructions (Addendum)
Keep scheduled appt with Dr. Clifton James on June 11  Have chest x-ray done today at [facility]. This is done in radiology.  They are open until 8:30 tonight.  Your physician has recommended you make the following change in your medication:  Start furosemide 40 mg by mouth daily.  Your physician recommends that you return for lab work on Oct 19, 2012.  The lab opens at 7:30 AM

## 2012-10-11 NOTE — Progress Notes (Signed)
History of Present Illness: 72 y.o. male who returns for follow up after a recent admission to the hospital 3/26-08/31/12 with a non-STEMI  followed by CABG. He has a history of DM2, HTN, tobacco abuse. He presented after developing sudden onset of diaphoresis, jaw pain radiating into his chest and his left arm. LHC 08/23/12 demonstrated severe 3 vessel CAD with preserved LV function. Echo 08/24/12: Mild LVH, EF 65-70%, mild RVE, PASP 36. He underwent consultation with [facility] (Dr. Tyrone Sage). Preoperative carotid Dopplers were negative for ICA stenosis. He underwent CABG 08/25/12 (LIMA-LAD, SVG-DX, SVG-OM1/OM2, SVG-PDA). Postoperative course was fairly uneventful. He remained in sinus rhythm.   He is added onto my schedule today with c/o weakness and fatigue. He has had no exertional chest pains. He has had some SOB. He has gained 8 lbs over one week. Our records indicate a 12 pound weight gain since 09/28/12 when he was seen by Dr. Tyrone Sage. Some lower ext edema. No dizziness, near syncope or syncope.   Primary Care Physician: [name]  Last Lipid Profile:Lipid Panel     Component Value Date/Time   CHOL 159 08/24/2012 0340   TRIG 108 08/24/2012 0340   HDL 49 08/24/2012 0340   CHOLHDL 3.2 08/24/2012 0340   VLDL 22 08/24/2012 0340   LDLCALC 88 08/24/2012 0340     Past Medical History  Diagnosis Date  . Diabetes mellitus without complication     a. Dx in 2010  . Hypertension     a. Dx in 2010  . Back pain, chronic   . DJD (degenerative joint disease)   . History of tobacco abuse     a. 57 year hx, quit 03/2012.  [name] Chronic pain   . Fibromyalgia   . CAD (coronary artery disease)     a. s/p NSTEMI 07/2012 => LHC with 3v CAD => s/p CABG 08/25/12 (LIMA-LAD, SVG-DX, SVG-OM1/OM2, SVG-PDA) Dr. Tyrone Sage;  b.  Echo 08/24/12:  Mild LVH, EF 65-70%, mild RVE, PASP 36  . Chronic back pain   . HLD (hyperlipidemia)     Past Surgical History  Procedure Laterality Date  . Right ankle surgery    .  Cholecystectomy    . Bilateral rotator cuff repair    . Transurethral resection of prostate    . Vocal cord tumor resection    . Skin cancer of left face - resected Left     parotid gland resection-non cancerous  . Coronary artery bypass graft N/A 08/25/2012    Procedure: CORONARY ARTERY BYPASS GRAFTING (CABG);  Surgeon: Delight Ovens, MD;  Location: [facility] OR;  Service: Open Heart Surgery;  Laterality: N/A;  Coronary artery bypass graft times five using left internal mammary artery and bilateral saphenous leg vein using endoscope.  . Intraoperative transesophageal echocardiogram N/A 08/25/2012    Procedure: INTRAOPERATIVE TRANSESOPHAGEAL ECHOCARDIOGRAM;  Surgeon: Delight Ovens, MD;  Location: [facility] OR;  Service: Open Heart Surgery;  Laterality: N/A;    Current Outpatient Prescriptions  Medication Sig Dispense Refill  . aspirin EC 325 MG EC tablet Take 1 tablet (325 mg total) by mouth daily.  30 tablet    . atorvastatin (LIPITOR) 80 MG tablet Take 1 tablet (80 mg total) by mouth daily at 6 PM.  30 tablet  1  . buprenorphine (BUTRANS - DOSED MCG/HR) 10 MCG/HR PTWK Place 10 mcg onto the skin once a week.      [name] buPROPion (WELLBUTRIN XL) 300 MG 24 hr tablet Take 150 mg by mouth daily.       [name]  cholecalciferol (VITAMIN D) 1000 UNITS tablet Take 1,000 Units by mouth daily.      . Hydrocodone-Acetaminophen 7.5-300 MG TABS Take 1 tablet by mouth 3 (three) times daily as needed.  30 each  0  . metFORMIN (GLUCOPHAGE) 500 MG tablet Take 500 mg by mouth daily.      . metoprolol tartrate (LOPRESSOR) 25 MG tablet Take 0.5 tablets (12.5 mg total) by mouth 2 (two) times daily.  30 tablet  1  . oxyCODONE-acetaminophen (PERCOCET) 10-325 MG per tablet Take 1 tablet by mouth every 4 (four) hours as needed for pain.  30 tablet  0  . pantoprazole (PROTONIX) 40 MG tablet Take 40 mg by mouth daily.      . temazepam (RESTORIL) 15 MG capsule Take 15 mg by mouth at bedtime as needed.       . Vilazodone HCl (VIIBRYD) 40  MG TABS Take 40 mg by mouth daily.       No current facility-administered medications for this visit.    No Known Allergies  History   Social History  . Marital Status: Married    Spouse Name: N/A    Number of Children: N/A  . Years of Education: N/A   Occupational History  . Not on file.   Social History Main Topics  . Smoking status: Former Smoker -- 1.00 packs/day for 57 years    Types: Cigarettes  . Smokeless tobacco: Former Neurosurgeon    Types: Chew     Comment: smoked from the age of 86.  Quit 03/2012  . Alcohol Use: No  . Drug Use: No  . Sexually Active: Not on file   Other Topics Concern  . Not on file   Social History Narrative   Lives in [place] with wife.  Retired.    Family History  Problem Relation Age of Onset  . Heart attack Father     died @ 36  . Lung cancer Mother     died @ 60  . Other Sister     A&W in her 36's  . Bladder Cancer Brother     alive    Review of Systems:  As stated in the HPI and otherwise negative.   BP 110/70  Pulse 74  Ht 6\' 1"  (1.854 m)  Wt 246 lb 12.8 oz (111.948 kg)  BMI 32.57 kg/m2  Physical Examination: General: Well developed, well nourished, NAD HEENT: OP clear, mucus membranes moist SKIN: warm, dry. No rashes. Neuro: No focal deficits Musculoskeletal: Muscle strength 5/5 all ext Psychiatric: Mood and affect normal Neck: No JVD, no carotid bruits, no thyromegaly, no lymphadenopathy. Lungs:Clear bilaterally, no wheezes, rhonci, crackles Cardiovascular: Regular rate and rhythm. No murmurs, gallops or rubs. Abdomen:Soft. Bowel sounds present. Non-tender.  Extremities: 1+ bilateral lower extremity edema. Pulses are 2 + in the bilateral DP/PT.  EKG:  NSR, rate 63 bpm.   Cardiac cath 08/23/12: Left main: No obstructive disease.  Left Anterior Descending Artery: Large caliber vessel that courses to the apex. The proximal vessel is severely calcified and has diffuse 70% stenosis. The mid vessel is calcified and has a  focal 90% stenosis. The remainder of the mid and distal vessel. The diagonal is moderate in caliber and has ostial 70% stenosis.  Circumflex Artery: Large caliber vessel with mild proximal stenosis. The first obtuse marginal branch is moderate in caliber with 99% proximal stenosis. There is a moderate caliber posterolateral branch with no disease noted.  Right Coronary Artery: 100% proximal occlusion, chronic appearing.  Left Ventricular Angiogram: LVEF=55-60%.   Assessment and Plan:   1. CAD: Recent CABG. No exertional chest pains but he does have increased dyspnea, weakness. EKG ok today. I do not think his symptoms are cardiac.Most likely due to volume overload.   2. Acute on chronic diastolic CHF: His weight is up 12 lbs. Increased LE edema. Will start Lasix 40 mg po Qdaily. Check BMET, CBC and BNP today. Check CXR with decreased sounds right lung. Recheck BMET in 10 days.   3. HTN:  BP ok. No changes. If has hypotension with exercise, will need to stop Lopressor.   4. Hyperlipidemia: On statin.

## 2012-10-11 NOTE — Progress Notes (Signed)
Pt arrived at cardiac rehab today reporting a fall at home yesterday without injury.  Pt also c/o fatigue and generalized malaise today.  Pt weight up from 109.3kg to 111.9kg today.  Pt has trace pedal edema, lungs rales left lower lobe.  Pt also c/o  Swelling and a  feeling of fluid on left chest. O2 sat-98%  BP- 106/74. CBG-94.  Pt did not exercise. Appt scheduled with Dr. Clifton James today at 3pm.  Pt verbalized understanding.

## 2012-10-12 ENCOUNTER — Ambulatory Visit (INDEPENDENT_AMBULATORY_CARE_PROVIDER_SITE_OTHER): Payer: Self-pay | Admitting: Cardiothoracic Surgery

## 2012-10-12 ENCOUNTER — Encounter: Payer: Self-pay | Admitting: Cardiothoracic Surgery

## 2012-10-12 ENCOUNTER — Other Ambulatory Visit: Payer: Self-pay | Admitting: Physician Assistant

## 2012-10-12 VITALS — BP 134/85 | HR 62 | Resp 20 | Ht 73.0 in | Wt 246.0 lb

## 2012-10-12 DIAGNOSIS — IMO0002 Reserved for concepts with insufficient information to code with codable children: Secondary | ICD-10-CM

## 2012-10-12 DIAGNOSIS — I2510 Atherosclerotic heart disease of native coronary artery without angina pectoris: Secondary | ICD-10-CM

## 2012-10-12 DIAGNOSIS — Z951 Presence of aortocoronary bypass graft: Secondary | ICD-10-CM

## 2012-10-12 LAB — BRAIN NATRIURETIC PEPTIDE: Pro B Natriuretic peptide (BNP): 155 pg/mL — ABNORMAL HIGH (ref 0.0–100.0)

## 2012-10-12 LAB — CBC WITH DIFFERENTIAL/PLATELET
Basophils Absolute: 0 10*3/uL (ref 0.0–0.1)
Eosinophils Absolute: 0.4 10*3/uL (ref 0.0–0.7)
MCHC: 33.7 g/dL (ref 30.0–36.0)
MCV: 87.1 fl (ref 78.0–100.0)
Monocytes Absolute: 0.3 10*3/uL (ref 0.1–1.0)
Neutrophils Relative %: 56.6 % (ref 43.0–77.0)
Platelets: 274 10*3/uL (ref 150.0–400.0)
RDW: 14.4 % (ref 11.5–14.6)

## 2012-10-12 LAB — BASIC METABOLIC PANEL
Calcium: 9 mg/dL (ref 8.4–10.5)
GFR: 100.19 mL/min (ref >60.00)
Glucose, Bld: 95 mg/dL (ref 70–99)
Sodium: 139 mEq/L (ref 135–145)

## 2012-10-12 NOTE — Progress Notes (Signed)
[address]            [place] [address]          [phone]       Timothy Cervantes [facility] Medical Record #[id number] Date of Birth: [birth year]  Timothy Patch, MD Timothy Patch, MD  Chief Complaint:   PostOp Follow Up Visit 08/25/2012  OPERATIVE REPORT  PREOPERATIVE DIAGNOSIS: Recent non-ST elevation myocardial infarction.  POSTOPERATIVE DIAGNOSIS: Recent non-ST elevation myocardial infarction  with anatomic evidence of transmural inferior myocardial infarction.  PROCEDURE PERFORMED: Coronary artery bypass grafting x5 with the left  internal mammary to the left anterior descending coronary artery,  reverse saphenous vein graft to the diagonal coronary artery, sequential  reverse saphenous vein graft to the first and second obtuse marginal,  and a reverse saphenous vein graft to the posterior descending coronary  artery with right thigh and calf and left thigh and endo vein  harvesting.   History of Present Illness:     Patient returns for followup visit after coronary artery bypass grafting March 28. Postop he had some seroma develop just below the right knee which was aspirated. The patient returns today because he has noted some increase in size of the seroma that had previously been aspirated in his right knee. He started in cardiac rehabilitation and stop me in the hall a couple days ago noting that it had increased in size and appointment was made for him to come in today.  Patient notes that he went to see cardiology yesterday because of 14 pound weight gain and increasing pedal edema. He was started on Lasix 40 mg a day yesterday and is starting noted improvement in the ankle edema and exertional shortness of breath.      History  Smoking status  . Former Smoker -- 1.00 packs/day for 57 years  . Types: Cigarettes  Smokeless tobacco  . Former Neurosurgeon  . Types: Chew    Comment: smoked from the age of 33.  Quit 03/2012        No Known Allergies  Current Outpatient Prescriptions  Medication Sig Dispense Refill  . aspirin EC 325 MG EC tablet Take 1 tablet (325 mg total) by mouth daily.  30 tablet    . atorvastatin (LIPITOR) 80 MG tablet Take 1 tablet (80 mg total) by mouth daily at 6 PM.  30 tablet  1  . buprenorphine (BUTRANS - DOSED MCG/HR) 10 MCG/HR PTWK Place 10 mcg onto the skin once a week.      [name] buPROPion (WELLBUTRIN XL) 300 MG 24 hr tablet Take 150 mg by mouth daily.       . cholecalciferol (VITAMIN D) 1000 UNITS tablet Take 1,000 Units by mouth daily.      . furosemide (LASIX) 40 MG tablet Take 1 tablet (40 mg total) by mouth daily.  30 tablet  6  . Hydrocodone-Acetaminophen 7.5-300 MG TABS Take 1 tablet by mouth 3 (three) times daily as needed.  30 each  0  . metFORMIN (GLUCOPHAGE) 500 MG tablet Take 500 mg by mouth daily.      . metoprolol tartrate (LOPRESSOR) 25 MG tablet Take 0.5 tablets (12.5 mg total) by mouth 2 (two) times daily.  30 tablet  1  . oxyCODONE-acetaminophen (PERCOCET) 10-325 MG per tablet Take 1 tablet by mouth every 4 (four) hours as needed for pain.  30 tablet  0  .  pantoprazole (PROTONIX) 40 MG tablet Take 40 mg by mouth daily.      . temazepam (RESTORIL) 15 MG capsule Take 15 mg by mouth at bedtime as needed.       . Vilazodone HCl (VIIBRYD) 40 MG TABS Take 40 mg by mouth daily.       No current facility-administered medications for this visit.     Physical Exam: BP 134/85  Pulse 62  Resp 20  Ht 6\' 1"  (1.854 m)  Wt 246 lb (111.585 kg)  BMI 32.46 kg/m2  SpO2 97%  General appearance: alert and cooperative Neurologic: intact Heart: regular rate and rhythm, S1, S2 normal, no murmur, click, rub or gallop and normal apical impulse Lungs: clear to auscultation bilaterally and normal percussion bilaterally Abdomen: soft, non-tender; bowel sounds normal; no masses,  no organomegaly Extremities: extremities normal, atraumatic, no cyanosis or edema and Homans sign is  negative, no sign of DVT Wound: healing well The patient's lymphocele at the right knee has enlarged slightly from when he was last seen, The skin was sterilely prepped and an 18-gauge needle was used to aspirate approximately 15 cc of straw-colored fluid with complete collapse of the lymphocele  Diagnostic Studies & Laboratory data:         Recent Radiology Findings: Dg Chest 2 View  10/11/2012   *RADIOLOGY REPORT*  Clinical Data: Recent CABG.  CHEST - 2 VIEW  Comparison: 09/28/2012.  Findings: Trachea is midline.  Heart size stable.  Sternotomy wires are unchanged in position.  Minimal scattered linear densities at the lung bases.  Lungs are otherwise clear.  No pleural fluid.  No pneumothorax.  IMPRESSION: Minimal scattered atelectasis or scarring at the lung bases.   Original Report Authenticated By: Leanna Battles, M.D.      Recent Labs: Lab Results  Component Value Date   WBC 5.3 10/11/2012   HGB 12.9* 10/11/2012   HCT 38.2* 10/11/2012   PLT 274.0 10/11/2012   GLUCOSE 95 10/11/2012   CHOL 159 08/24/2012   TRIG 108 08/24/2012   HDL 49 08/24/2012   LDLCALC 88 08/24/2012   ALT 49 08/24/2012   AST 61* 08/24/2012   NA 139 10/11/2012   K 4.6 10/11/2012   CL 105 10/11/2012   CREATININE 0.8 10/11/2012   BUN 15 10/11/2012   CO2 29 10/11/2012   INR 1.29 08/25/2012   HGBA1C 7.4* 08/24/2012      Assessment / Plan:      Patient with increasing fluid retention over the past several days following coronary bypass grafting, being treated with increased diuretics by cardiology. 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 The patient will check with cardiology on Monday after reporting his weight about the continued dose of Lasix. I'll plan to see him back in one month to check his wound status. He is enrolled in cardiac rehabilitation and is planning to go again tomorrow.      Zyanne Schumm B 10/12/2012 10:55 AM

## 2012-10-15 ENCOUNTER — Other Ambulatory Visit: Payer: Self-pay | Admitting: Physician Assistant

## 2012-10-16 ENCOUNTER — Encounter (HOSPITAL_COMMUNITY)
Admission: RE | Admit: 2012-10-16 | Discharge: 2012-10-16 | Disposition: A | Discharge status: Home / Self Care | Payer: Medicare Other | Source: Ambulatory Visit | Attending: Cardiovascular Disease | Admitting: Cardiovascular Disease

## 2012-10-16 NOTE — Progress Notes (Signed)
Reviewed quality of life with patient.  Pt reports great dissatisfaction with his health and the resulting decline in functional ability. Pt reports he is dissatisfied with having to rely on others to do tasks that usually he would perform.  Pt states he has good family support and exhibits appropriate coping skills, however could also be masked by his sense of humor.  Will continue to monitor.

## 2012-10-16 NOTE — Progress Notes (Signed)
Pt returned to cardiac rehab today, participated in light activity without difficulty.  Pt weight down 0.9kg.  Pt reports his symptoms are greatly relieved with decreased pedal edema and fatigue.  Pt reports home weights are up and down, however pt did not bring weight chart with him for review.  Pt states he will call Dr Clifton James office to report home weights as instructed.

## 2012-10-16 NOTE — Progress Notes (Signed)
Timothy Cervantes 72 y.o. male Nutrition Note Spoke with pt.  Nutrition Plan and Nutrition Survey goals reviewed with pt. Pt is following Step 1 of the Therapeutic Lifestyle Changes diet. Pt wants to lose wt and reports he has been following a "1000 kcal diet." Recommended kcal level and reasoning behind recommendation reviewed. Per discussion, pt eats an egg and [place] bacon for breakfast and fried chicken (skin removed and "the fat squeezed out") with a biscuit from [employer] most days of the week. Food choices discussed. Pt states his wt has been fluctuating significantly; [name], RN aware. Pt is diabetic. Last A1c indicates blood glucose not well-controlled. Pt does not check CBG's regularly. Pt expressed understanding of the information reviewed. Pt aware of nutrition education classes offered and plans on attending nutrition classes.  Nutrition Diagnosis   Food-and nutrition-related knowledge deficit related to lack of exposure to information as related to diagnosis of: ? CVD ? DM (A1c 7.4)   Obesity related to excessive energy intake as evidenced by a BMI of 30.9  Nutrition RX/ Estimated Daily Nutrition Needs for: wt loss  1800-2300 Kcal, 50-60 gm fat, 12-16 gm sat fat, 1.8-2.3 gm trans-fat, <1500 mg sodium, 250 gm CHO   Nutrition Intervention   Pt's individual nutrition plan reviewed with pt.   Benefits of adopting Therapeutic Lifestyle Changes discussed when Medficts reviewed.   Pt to attend the Portion Distortion class   Pt to attend the  ? Nutrition I class                     ? Nutrition II class        ? Diabetes Blitz class       ? Diabetes Q & A class   Pt given handouts for: ? Nutrition I class ? Nutrition II class    Continue client-centered nutrition education by RD, as part of interdisciplinary care. Goal(s)   Pt to identify and limit food sources of saturated fat, trans fat, and cholesterol   Pt to identify food quantities necessary to achieve: ? wt loss to a goal wt of  210-222 lb (95.6-101.2 kg) at graduation from cardiac rehab.    CBG concentrations in the normal range or as close to normal as is safely possible. Monitor and Evaluate progress toward nutrition goal with team.  [name], M.Ed, RD, LDN, CDE 10/16/2012 9:50 AM

## 2012-10-17 ENCOUNTER — Telehealth: Payer: Self-pay | Admitting: Cardiovascular Disease

## 2012-10-17 NOTE — Telephone Encounter (Signed)
New problem   Pt calling to report his daily weight

## 2012-10-17 NOTE — Telephone Encounter (Signed)
Pt called to report his weights:  10/12/2012 PM:  243lbs 10/13/2012 AM:  234lbs 10/14/2012 AM:  231lbs                  PM:  238lbs 10/15/2012 AM:  234lbs                  PM:  238lbs 10/16/2012 AM:  232lbs                  PM:  235lbs 10/17/2012 AM:  235lbs  Pt states he feels better.  States he can now get his shoes on.  Coming in for labs on 10/19/2012.  Will forward to Dr. Clifton James.

## 2012-10-17 NOTE — Telephone Encounter (Signed)
Pat, His weight is down. I would like to continue Lasix 40 mg po Qdaily, he has labs this week. Thanks, chris

## 2012-10-18 ENCOUNTER — Encounter (HOSPITAL_COMMUNITY)
Admission: RE | Admit: 2012-10-18 | Discharge: 2012-10-18 | Disposition: A | Discharge status: Home / Self Care | Payer: Medicare Other | Source: Ambulatory Visit | Attending: Cardiovascular Disease | Admitting: Cardiovascular Disease

## 2012-10-18 ENCOUNTER — Telehealth: Payer: Self-pay | Admitting: Cardiovascular Disease

## 2012-10-18 NOTE — Telephone Encounter (Signed)
Left message to call back  

## 2012-10-18 NOTE — Telephone Encounter (Signed)
New problem    Per pt returning your call

## 2012-10-18 NOTE — Telephone Encounter (Signed)
Spoke with pt and gave him information from Dr. Clifton James. He is planning on being here for lab work tomorrow. He is also asking if he can do light vacuuming. He is 8 weeks post surgery. He states Dr. Tyrone Sage has cleared him resume fishing.  Note reviewed and he should avoid lifting and pulling for 6 weeks.  I told him light vacuuming should be OK but that he should not lift greater than 25 pounds.

## 2012-10-18 NOTE — Progress Notes (Deleted)
PSYCHOSOCIAL ASSESSMENT  Pt quality of life is altered by inability to complete tasks as prior to his illness.  His physical ability due to fatigue and weakness as well as MD direction has limited his abilities.  Pt has significant stressors which are self reported as caregiver strain from mother in law, recent surgery.    However, pt exhibits positive coping skills with supportive family.  Pt is looking forward to participating in rehab.  Offered emotional support and reassurance.  Will continue to monitor.

## 2012-10-18 NOTE — Telephone Encounter (Signed)
Spoke with pt. See phone note dated 10/17/12 for documentation regarding this call.

## 2012-10-19 ENCOUNTER — Other Ambulatory Visit (INDEPENDENT_AMBULATORY_CARE_PROVIDER_SITE_OTHER): Payer: Medicare Other

## 2012-10-19 DIAGNOSIS — I5033 Acute on chronic diastolic (congestive) heart failure: Secondary | ICD-10-CM

## 2012-10-19 DIAGNOSIS — I509 Heart failure, unspecified: Secondary | ICD-10-CM

## 2012-10-19 DIAGNOSIS — I25810 Atherosclerosis of coronary artery bypass graft(s) without angina pectoris: Secondary | ICD-10-CM

## 2012-10-19 LAB — BASIC METABOLIC PANEL WITH GFR
BUN: 17 mg/dL (ref 6–23)
CO2: 28 meq/L (ref 19–32)
Calcium: 9.1 mg/dL (ref 8.4–10.5)
Chloride: 102 meq/L (ref 96–112)
Creatinine, Ser: 0.9 mg/dL (ref 0.4–1.5)
GFR: 89.87 mL/min (ref >60.00)
Glucose, Bld: 128 mg/dL — ABNORMAL HIGH (ref 70–99)
Potassium: 4.3 meq/L (ref 3.5–5.1)
Sodium: 136 meq/L (ref 135–145)

## 2012-10-23 ENCOUNTER — Encounter (HOSPITAL_COMMUNITY): Admission: RE | Admit: 2012-10-23 | Payer: Medicare Other | Source: Ambulatory Visit

## 2012-10-25 ENCOUNTER — Encounter (HOSPITAL_COMMUNITY)
Admission: RE | Admit: 2012-10-25 | Discharge: 2012-10-25 | Disposition: A | Discharge status: Home / Self Care | Payer: Medicare Other | Source: Ambulatory Visit | Attending: Cardiovascular Disease | Admitting: Cardiovascular Disease

## 2012-10-25 NOTE — Progress Notes (Signed)
Timothy Cervantes 72 y.o. male Nutrition Note Spoke with pt. Pt is diabetic. Last A1c indicates blood glucose not well-controlled. Pt states his wife checks his fasting CBG's 3 times a week. Pt unaware of what his CBG 's range. Per discussion with pt, pt A1c re-checked by Dr. Clifton James and "it was 6.1." Pt's diabetes test reviewed. Pt received a score of 15/15. Pt expressed understanding of the information reviewed. Pt aware of nutrition education classes offered and plans on attending nutrition classes.  Nutrition Diagnosis   Food-and nutrition-related knowledge deficit related to lack of exposure to information as related to diagnosis of: ? CVD ? DM (A1c 7.4)   Obesity related to excessive energy intake as evidenced by a BMI of 30.9  Nutrition RX/ Estimated Daily Nutrition Needs for: wt loss  1800-2300 Kcal, 50-60 gm fat, 12-16 gm sat fat, 1.8-2.3 gm trans-fat, <1500 mg sodium, 250 gm CHO   Nutrition Intervention   Pt's individual nutrition plan reviewed with pt.   Pt to attend the Portion Distortion class   Pt to attend the  ? Nutrition I class - handout given 10/16/12                    ? Nutrition II class - handout given 10/16/12        ? Diabetes Blitz class       ? Diabetes Q & A class   Pt given handouts for: ? Diabetes Blitz class    Continue client-centered nutrition education by RD, as part of interdisciplinary care. Goal(s)   Pt to identify and limit food sources of saturated fat, trans fat, and cholesterol   Pt to identify food quantities necessary to achieve: ? wt loss to a goal wt of 210-222 lb (95.6-101.2 kg) at graduation from cardiac rehab.    CBG concentrations in the normal range or as close to normal as is safely possible. Monitor and Evaluate progress toward nutrition goal with team.  [name], M.Ed, RD, LDN, CDE 10/25/2012 8:49 AM

## 2012-10-30 ENCOUNTER — Encounter (HOSPITAL_COMMUNITY)
Admission: RE | Admit: 2012-10-30 | Discharge: 2012-10-30 | Disposition: A | Discharge status: Home / Self Care | Payer: Medicare Other | Source: Ambulatory Visit | Attending: Cardiovascular Disease | Admitting: Cardiovascular Disease

## 2012-10-30 DIAGNOSIS — Z5189 Encounter for other specified aftercare: Secondary | ICD-10-CM | POA: Insufficient documentation

## 2012-10-30 DIAGNOSIS — I10 Essential (primary) hypertension: Secondary | ICD-10-CM | POA: Insufficient documentation

## 2012-10-30 DIAGNOSIS — I214 Non-ST elevation (NSTEMI) myocardial infarction: Secondary | ICD-10-CM | POA: Insufficient documentation

## 2012-10-30 DIAGNOSIS — I2510 Atherosclerotic heart disease of native coronary artery without angina pectoris: Secondary | ICD-10-CM | POA: Insufficient documentation

## 2012-10-30 NOTE — Progress Notes (Signed)
[id number] Reviewed home exercise guidelines with patient including endpoints, temperature precautions, target heart rate and rate of perceived exertion. Pt plans to walk as his mode of home exercise and has joined the [facility]. Pt voices understanding of instructions given. Pt plans to d/c from the program at this time due to high co-pay and will continue his exercise at the [facility].  Cristy Hilts, MS, ACSM CES

## 2012-10-31 ENCOUNTER — Other Ambulatory Visit: Payer: Self-pay | Admitting: Physician Assistant

## 2012-10-31 ENCOUNTER — Other Ambulatory Visit (INDEPENDENT_AMBULATORY_CARE_PROVIDER_SITE_OTHER): Payer: Medicare Other

## 2012-10-31 ENCOUNTER — Telehealth: Payer: Self-pay | Admitting: *Deleted

## 2012-10-31 DIAGNOSIS — E785 Hyperlipidemia, unspecified: Secondary | ICD-10-CM

## 2012-10-31 LAB — HEPATIC FUNCTION PANEL
AST: 38 U/L — ABNORMAL HIGH (ref 0–37)
Albumin: 3.8 g/dL (ref 3.5–5.2)
Alkaline Phosphatase: 64 U/L (ref 39–117)
Total Protein: 7.5 g/dL (ref 6.0–8.3)

## 2012-10-31 LAB — LIPID PANEL
Cholesterol: 107 mg/dL (ref 0–200)
LDL Cholesterol: 51 mg/dL (ref 0–99)
Triglycerides: 103 mg/dL (ref 0.0–149.0)

## 2012-10-31 NOTE — Telephone Encounter (Signed)
Message copied by Tarri Fuller on Tue Oct 31, 2012  2:53 PM ------      Message from: [place], [place] T      Created: Tue Oct 31, 2012  2:19 PM       Lipids okay      LFTs okay      Continue current treatment plan      Tereso Newcomer, PA-C        10/31/2012 2:19 PM ------

## 2012-10-31 NOTE — Telephone Encounter (Signed)
pt notified about lab results with verbal understanding  

## 2012-11-01 ENCOUNTER — Encounter (HOSPITAL_COMMUNITY): Payer: Medicare Other

## 2012-11-03 ENCOUNTER — Other Ambulatory Visit: Payer: Self-pay | Admitting: Physician Assistant

## 2012-11-06 ENCOUNTER — Encounter (HOSPITAL_COMMUNITY): Payer: Medicare Other

## 2012-11-08 ENCOUNTER — Other Ambulatory Visit: Payer: Self-pay | Admitting: Physician Assistant

## 2012-11-08 ENCOUNTER — Telehealth: Payer: Self-pay | Admitting: Cardiovascular Disease

## 2012-11-08 ENCOUNTER — Encounter (HOSPITAL_COMMUNITY): Payer: Medicare Other

## 2012-11-08 ENCOUNTER — Ambulatory Visit (INDEPENDENT_AMBULATORY_CARE_PROVIDER_SITE_OTHER): Payer: Medicare Other | Admitting: Cardiovascular Disease

## 2012-11-08 ENCOUNTER — Encounter: Payer: Self-pay | Admitting: Cardiovascular Disease

## 2012-11-08 VITALS — BP 116/76 | HR 68 | Ht 73.0 in | Wt 246.0 lb

## 2012-11-08 DIAGNOSIS — I25810 Atherosclerosis of coronary artery bypass graft(s) without angina pectoris: Secondary | ICD-10-CM

## 2012-11-08 NOTE — Telephone Encounter (Signed)
New Prob     Pt would like to speak to nurse. Did not disclose anymore information.

## 2012-11-08 NOTE — Patient Instructions (Signed)
Your physician wants you to follow-up in:  3 months. Scheduled for February 08, 2013 at 8:45

## 2012-11-08 NOTE — Telephone Encounter (Signed)
Spoke with pt who reports he recently received a ticket for not wearing his seatbelt. He is requesting a letter stating he was not able to wear seat belt due to recent surgery. I asked pt to contact Dr. Dennie Maizes office to see if they could write this letter.

## 2012-11-08 NOTE — Progress Notes (Signed)
History of Present Illness: 72 y.o. male who returns for follow up after a recent admission to the hospital 3/26-08/31/12 with a non-STEMI followed by CABG. He has a history of DM2, HTN, tobacco abuse. He presented after developing sudden onset of diaphoresis, jaw pain radiating into his chest and his left arm. LHC 08/23/12 demonstrated severe 3 vessel CAD with preserved LV function. Echo 08/24/12: Mild LVH, EF 65-70%, mild RVE, PASP 36. He underwent consultation with [facility] (Dr. Tyrone Sage). Preoperative carotid Dopplers were negative for ICA stenosis. He underwent CABG 08/25/12 (LIMA-LAD, SVG-DX, SVG-OM1/OM2, SVG-PDA). Postoperative course was fairly uneventful. He remained in sinus rhythm. He was seen here in our office 10/11/12 with c/o weakness and fatigue. He had gained 12 lbs over the prior two weeks. He was started on Lasix.   He is here today for follow up. He has had no exertional chest pains.LE edema resolved. Feeling better.   Primary Care Physician: [name]  Last Lipid Profile:Lipid Panel     Component Value Date/Time   CHOL 107 10/31/2012 0821   TRIG 103.0 10/31/2012 0821   HDL 35.60* 10/31/2012 0821   CHOLHDL 3 10/31/2012 0821   VLDL 20.6 10/31/2012 0821   LDLCALC 51 10/31/2012 0821     Past Medical History  Diagnosis Date  . Diabetes mellitus without complication     a. Dx in 2010  . Hypertension     a. Dx in 2010  . Back pain, chronic   . DJD (degenerative joint disease)   . History of tobacco abuse     a. 57 year hx, quit 03/2012.  [name] Chronic pain   . Fibromyalgia   . CAD (coronary artery disease)     a. s/p NSTEMI 07/2012 => LHC with 3v CAD => s/p CABG 08/25/12 (LIMA-LAD, SVG-DX, SVG-OM1/OM2, SVG-PDA) Dr. Tyrone Sage;  b.  Echo 08/24/12:  Mild LVH, EF 65-70%, mild RVE, PASP 36  . Chronic back pain   . HLD (hyperlipidemia)     Past Surgical History  Procedure Laterality Date  . Right ankle surgery    . Cholecystectomy    . Bilateral rotator cuff repair    . Transurethral  resection of prostate    . Vocal cord tumor resection    . Skin cancer of left face - resected Left     parotid gland resection-non cancerous  . Coronary artery bypass graft N/A 08/25/2012    Procedure: CORONARY ARTERY BYPASS GRAFTING (CABG);  Surgeon: Delight Ovens, MD;  Location: [facility] OR;  Service: Open Heart Surgery;  Laterality: N/A;  Coronary artery bypass graft times five using left internal mammary artery and bilateral saphenous leg vein using endoscope.  . Intraoperative transesophageal echocardiogram N/A 08/25/2012    Procedure: INTRAOPERATIVE TRANSESOPHAGEAL ECHOCARDIOGRAM;  Surgeon: Delight Ovens, MD;  Location: [facility] OR;  Service: Open Heart Surgery;  Laterality: N/A;    Current Outpatient Prescriptions  Medication Sig Dispense Refill  . aspirin EC 325 MG EC tablet Take 1 tablet (325 mg total) by mouth daily.  30 tablet    . atorvastatin (LIPITOR) 80 MG tablet Take 1 tablet (80 mg total) by mouth daily at 6 PM.  30 tablet  1  . buprenorphine (BUTRANS - DOSED MCG/HR) 10 MCG/HR PTWK Place 10 mcg onto the skin once a week.      [name] buPROPion (WELLBUTRIN XL) 300 MG 24 hr tablet Take 150 mg by mouth daily.       . cholecalciferol (VITAMIN D) 1000 UNITS tablet Take 1,000  Units by mouth daily.      . clonazePAM (KLONOPIN) 1 MG tablet 1 tab nightly      . furosemide (LASIX) 40 MG tablet Take 1 tablet (40 mg total) by mouth daily.  30 tablet  6  . Hydrocodone-Acetaminophen 7.5-300 MG TABS Take 1 tablet by mouth 3 (three) times daily as needed.  30 each  0  . metFORMIN (GLUCOPHAGE) 500 MG tablet Take 500 mg by mouth daily.      . metoprolol tartrate (LOPRESSOR) 25 MG tablet Take 0.5 tablets (12.5 mg total) by mouth 2 (two) times daily.  30 tablet  1  . oxyCODONE-acetaminophen (PERCOCET) 10-325 MG per tablet Take 1 tablet by mouth every 4 (four) hours as needed for pain.  30 tablet  0  . pantoprazole (PROTONIX) 40 MG tablet Take 40 mg by mouth daily.      . Vilazodone HCl (VIIBRYD) 40 MG TABS  Take 40 mg by mouth daily.       No current facility-administered medications for this visit.    No Known Allergies  History   Social History  . Marital Status: Married    Spouse Name: N/A    Number of Children: N/A  . Years of Education: N/A   Occupational History  . Not on file.   Social History Main Topics  . Smoking status: Former Smoker -- 1.00 packs/day for 57 years    Types: Cigarettes  . Smokeless tobacco: Former Neurosurgeon    Types: Chew     Comment: smoked from the age of 46.  Quit 03/2012  . Alcohol Use: No  . Drug Use: No  . Sexually Active: Not on file   Other Topics Concern  . Not on file   Social History Narrative   Lives in [place] with wife.  Retired.    Family History  Problem Relation Age of Onset  . Heart attack Father     died @ 24  . Lung cancer Mother     died @ 40  . Other Sister     A&W in her 12's  . Bladder Cancer Brother     alive    Review of Systems:  As stated in the HPI and otherwise negative.   BP 116/76  Pulse 68  Ht 6\' 1"  (1.854 m)  Wt 246 lb (111.585 kg)  BMI 32.46 kg/m2  Physical Examination: General: Well developed, well nourished, NAD HEENT: OP clear, mucus membranes moist SKIN: warm, dry. No rashes. Neuro: No focal deficits Musculoskeletal: Muscle strength 5/5 all ext Psychiatric: Mood and affect normal Neck: No JVD, no carotid bruits, no thyromegaly, no lymphadenopathy. Lungs:Clear bilaterally, no wheezes, rhonci, crackles Cardiovascular: Regular rate and rhythm. No murmurs, gallops or rubs. Abdomen:Soft. Bowel sounds present. Non-tender.  Extremities: No lower extremity edema. Pulses are 2 + in the bilateral DP/PT.  EKG:  Assessment and Plan:   1. CAD: Recent CABG. Stable. Continue current meds.   2. Acute on chronic diastolic CHF: His weight is stable. He reports resolution of LE edema and abdominal bloating. Continue Lasix 40 mg po Qdaily.   3. HTN: BP stable.   4. Hyperlipidemia: On statin

## 2012-11-13 ENCOUNTER — Encounter (HOSPITAL_COMMUNITY): Payer: Medicare Other

## 2012-11-13 ENCOUNTER — Other Ambulatory Visit: Payer: Self-pay | Admitting: *Deleted

## 2012-11-13 DIAGNOSIS — I2510 Atherosclerotic heart disease of native coronary artery without angina pectoris: Secondary | ICD-10-CM

## 2012-11-15 ENCOUNTER — Encounter (HOSPITAL_COMMUNITY): Payer: Medicare Other

## 2012-11-16 ENCOUNTER — Ambulatory Visit
Admission: RE | Admit: 2012-11-16 | Discharge: 2012-11-16 | Disposition: A | Discharge status: Home / Self Care | Payer: Medicare Other | Source: Ambulatory Visit | Attending: Cardiothoracic Surgery | Admitting: Cardiothoracic Surgery

## 2012-11-16 ENCOUNTER — Ambulatory Visit (INDEPENDENT_AMBULATORY_CARE_PROVIDER_SITE_OTHER): Payer: Self-pay | Admitting: Cardiothoracic Surgery

## 2012-11-16 ENCOUNTER — Encounter: Payer: Self-pay | Admitting: Cardiothoracic Surgery

## 2012-11-16 ENCOUNTER — Ambulatory Visit: Payer: Medicare Other | Admitting: Cardiothoracic Surgery

## 2012-11-16 VITALS — BP 108/64 | HR 64 | Resp 16 | Ht 72.0 in | Wt 246.0 lb

## 2012-11-16 DIAGNOSIS — Z951 Presence of aortocoronary bypass graft: Secondary | ICD-10-CM

## 2012-11-16 DIAGNOSIS — I2510 Atherosclerotic heart disease of native coronary artery without angina pectoris: Secondary | ICD-10-CM

## 2012-11-16 DIAGNOSIS — IMO0002 Reserved for concepts with insufficient information to code with codable children: Secondary | ICD-10-CM

## 2012-11-16 MED ORDER — CEPHALEXIN 500 MG PO CAPS: 500.0000 mg | ORAL_CAPSULE | Freq: Three times a day (TID) | ORAL | Status: DC

## 2012-11-16 NOTE — Progress Notes (Signed)
[address]            [place] [address]          [phone]       Timothy Cervantes [facility] Medical Record #[id number] Date of Birth: [birth year]   Timothy Chimes, MD  Chief Complaint:   PostOp Follow Up Visit 08/25/2012  OPERATIVE REPORT  PREOPERATIVE DIAGNOSIS: Recent non-ST elevation myocardial infarction.  POSTOPERATIVE DIAGNOSIS: Recent non-ST elevation myocardial infarction  with anatomic evidence of transmural inferior myocardial infarction.  PROCEDURE PERFORMED: Coronary artery bypass grafting x5 with the left  internal mammary to the left anterior descending coronary artery,  reverse saphenous vein graft to the diagonal coronary artery, sequential  reverse saphenous vein graft to the first and second obtuse marginal,  and a reverse saphenous vein graft to the posterior descending coronary  artery with right thigh and calf and left thigh and endo vein  harvesting.   History of Present Illness:     Patient doing well post bypass. 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 This has been aspirated twice before. He addition he note one week of swelling of his 2,3 4, toes on the left foot with mild eurhythmia. Patient is diabetic. He has had no fever or chill, and is ambulating without difficulty.     History  Smoking status  . Former Smoker -- 1.00 packs/day for 57 years  . Types: Cigarettes  Smokeless tobacco  . Former Neurosurgeon  . Types: Chew    Comment: smoked from the age of 86.  Quit 03/2012       No Known Allergies  Current Outpatient Prescriptions  Medication Sig Dispense Refill  . aspirin EC 325 MG EC tablet Take 1 tablet (325 mg total) by mouth daily.  30 tablet    . atorvastatin (LIPITOR) 80 MG tablet Take 1 tablet (80 mg total) by mouth daily at 6 PM.  30 tablet  1  . buprenorphine (BUTRANS - DOSED MCG/HR) 10 MCG/HR PTWK Place 10 mcg onto the  skin once a week.      [name] buPROPion (WELLBUTRIN XL) 300 MG 24 hr tablet Take 150 mg by mouth daily.       . cholecalciferol (VITAMIN D) 1000 UNITS tablet Take 1,000 Units by mouth daily.      . clonazePAM (KLONOPIN) 1 MG tablet 1 tab nightly      . furosemide (LASIX) 40 MG tablet Take 1 tablet (40 mg total) by mouth daily.  30 tablet  6  . Hydrocodone-Acetaminophen 7.5-300 MG TABS Take 1 tablet by mouth 3 (three) times daily as needed.  30 each  0  . metFORMIN (GLUCOPHAGE) 500 MG tablet Take 500 mg by mouth daily.      . metoprolol tartrate (LOPRESSOR) 25 MG tablet Take 0.5 tablets (12.5 mg total) by mouth 2 (two) times daily.  30 tablet  1  . oxyCODONE-acetaminophen (PERCOCET) 10-325 MG per tablet Take 1 tablet by mouth every 4 (four) hours as needed for pain.  30 tablet  0  . pantoprazole (PROTONIX) 40 MG tablet Take 40 mg by mouth daily.      . Vilazodone HCl (VIIBRYD) 40 MG TABS Take 40 mg by mouth daily.       No current facility-administered medications for this visit.     Physical  Exam: There were no vitals taken for this visit.  General appearance: alert and cooperative Neurologic: intact Heart: regular rate and rhythm, S1, S2 normal, no murmur, click, rub or gallop and normal apical impulse Lungs: clear to auscultation bilaterally and normal percussion bilaterally Abdomen: soft, non-tender; bowel sounds normal; no masses,  no organomegaly Extremities: extremities normal, atraumatic, no cyanosis or edema and Homans sign is negative, no sign of DVT Wound: healing well Mild swelling and erythremia on dorsum of 2 3 4  left toes. Does not appear involving the joints as if gout   5 cc of straw colored fluid aspirated from the rt knee lymphocele sterilely after topical spray of ethyl chloride    Diagnostic Studies & Laboratory data:         Recent Radiology Findings: No results found.    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 He is enrolled in cardiac rehabilitation and is planning to go again tomorrow    Timothy Cervantes B 11/16/2012 10:02 AM

## 2012-11-16 NOTE — Patient Instructions (Addendum)
Keflex 500 three times per day tolnaftate (TINACTIN) cream to left toes    Cellulitis Cellulitis is an infection of the skin and the tissue beneath it. The infected area is usually red and tender. Cellulitis occurs most often in the arms and lower legs.  CAUSES  Cellulitis is caused by bacteria that enter the skin through cracks or cuts in the skin. The most common types of bacteria that cause cellulitis are Staphylococcus and Streptococcus. SYMPTOMS   Redness and warmth.  Swelling.  Tenderness or pain.  Fever. DIAGNOSIS  Your caregiver can usually determine what is wrong based on a physical exam. Blood tests may also be done. TREATMENT  Treatment usually involves taking an antibiotic medicine. HOME CARE INSTRUCTIONS   Take your antibiotics as directed. Finish them even if you start to feel better.  Keep the infected arm or leg elevated to reduce swelling.  Apply a warm cloth to the affected area up to 4 times per day to relieve pain.  Only take over-the-counter or prescription medicines for pain, discomfort, or fever as directed by your caregiver.  Keep all follow-up appointments as directed by your caregiver. SEEK MEDICAL CARE IF:   You notice red streaks coming from the infected area.  Your red area gets larger or turns dark in color.  Your bone or joint underneath the infected area becomes painful after the skin has healed.  Your infection returns in the same area or another area.  You notice a swollen bump in the infected area.  You develop new symptoms. SEEK IMMEDIATE MEDICAL CARE IF:   You have a fever.  You feel very sleepy.  You develop vomiting or diarrhea.  You have a general ill feeling (malaise) with muscle aches and pains. MAKE SURE YOU:   Understand these instructions.  Will watch your condition.  Will get help right away if you are not doing well or get worse. Document Released: 02/24/2005 Document Revised: 11/16/2011 Document Reviewed:  08/02/2011 [facility] Patient Information 2014 [place], [place]. Athlete's Foot Athlete's foot (tinea pedis) is a fungal infection of the skin on the feet. It often occurs on the skin between the toes or underneath the toes. It can also occur on the soles of the feet. Athlete's foot is more likely to occur in hot, humid weather. Not washing your feet or changing your socks often enough can contribute to athlete's foot. The infection can spread from person to person (contagious). CAUSES Athlete's foot is caused by a fungus. This fungus thrives in warm, moist places. Most people get athlete's foot by sharing shower stalls, towels, and wet floors with an infected person. People with weakened immune systems, including those with diabetes, may be more likely to get athlete's foot. SYMPTOMS   Itchy areas between the toes or on the soles of the feet.  White, flaky, or scaly areas between the toes or on the soles of the feet.  Tiny, intensely itchy blisters between the toes or on the soles of the feet.  Tiny cuts on the skin. These cuts can develop a bacterial infection.  Thick or discolored toenails. DIAGNOSIS  Your caregiver can usually tell what the problem is by doing a physical exam. Your caregiver may also take a skin sample from the rash area. The skin sample may be examined under a microscope, or it may be tested to see if fungus will grow in the sample. A sample may also be taken from your toenail for testing. TREATMENT  Over-the-counter and prescription medicines can  be used to kill the fungus. These medicines are available as powders or creams. Your caregiver can suggest medicines for you. Fungal infections respond slowly to treatment. You may need to continue using your medicine for several weeks. PREVENTION   Do not share towels.  Wear sandals in wet areas, such as shared locker rooms and shared showers.  Keep your feet dry. Wear shoes that allow air to circulate. Wear cotton or wool  socks. HOME CARE INSTRUCTIONS   Take medicines as directed by your caregiver. Do not use steroid creams on athlete's foot.  Keep your feet clean and cool. Wash your feet daily and dry them thoroughly, especially between your toes.  Change your socks every day. Wear cotton or wool socks. In hot climates, you may need to change your socks 2 to 3 times per day.  Wear sandals or canvas tennis shoes with good air circulation.  If you have blisters, soak your feet in Burow's solution or Epsom salts for 20 to 30 minutes, 2 times a day to dry out the blisters. Make sure you dry your feet thoroughly afterward. SEEK MEDICAL CARE IF:   You have a fever.  You have swelling, soreness, warmth, or redness in your foot.  You are not getting better after 7 days of treatment.  You are not completely cured after 30 days.  You have any problems caused by your medicines. MAKE SURE YOU:   Understand these instructions.  Will watch your condition.  Will get help right away if you are not doing well or get worse. Document Released: 05/14/2000 Document Revised: 08/09/2011 Document Reviewed: 03/05/2011 [facility] Patient Information 2014 [place], [place].

## 2012-11-20 ENCOUNTER — Encounter (HOSPITAL_COMMUNITY): Payer: Medicare Other

## 2012-11-22 ENCOUNTER — Encounter (HOSPITAL_COMMUNITY): Payer: Medicare Other

## 2012-11-23 ENCOUNTER — Encounter: Payer: Self-pay | Admitting: Cardiothoracic Surgery

## 2012-11-23 ENCOUNTER — Ambulatory Visit (INDEPENDENT_AMBULATORY_CARE_PROVIDER_SITE_OTHER): Payer: Self-pay | Admitting: Cardiothoracic Surgery

## 2012-11-23 VITALS — BP 136/83 | HR 73 | Resp 20 | Ht 73.0 in | Wt 238.0 lb

## 2012-11-23 DIAGNOSIS — I2510 Atherosclerotic heart disease of native coronary artery without angina pectoris: Secondary | ICD-10-CM

## 2012-11-23 DIAGNOSIS — L03119 Cellulitis of unspecified part of limb: Secondary | ICD-10-CM

## 2012-11-23 DIAGNOSIS — L02619 Cutaneous abscess of unspecified foot: Secondary | ICD-10-CM

## 2012-11-23 NOTE — Progress Notes (Signed)
[address]            [place] [address]          [phone]       DAEGON DEISS [facility] Medical Record #[id number] Date of Birth: [birth year]   Timothy Cervantes [[id number]] UVOZ,DGUYQIH, MD  Chief Complaint: Followup visit for cellulitis left foot    08/25/2012  OPERATIVE REPORT  PREOPERATIVE DIAGNOSIS: Recent non-ST elevation myocardial infarction.  POSTOPERATIVE DIAGNOSIS: Recent non-ST elevation myocardial infarction  with anatomic evidence of transmural inferior myocardial infarction.  PROCEDURE PERFORMED: Coronary artery bypass grafting x5 with the left  internal mammary to the left anterior descending coronary artery,  reverse saphenous vein graft to the diagonal coronary artery, sequential  reverse saphenous vein graft to the first and second obtuse marginal,  and a reverse saphenous vein graft to the posterior descending coronary  artery with right thigh and calf and left thigh and endo vein  harvesting.   History of Present Illness:     Patient doing well post bypass. 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 This has been aspirated twice before. He addition he note one week of swelling of his 2,3 4, toes on the left foot with mild eurhythmia. Patient is diabetic. He has had no fever or chill, and is ambulating without difficulty. And the patient was seen last week for his regular postop visit he was noted to have cellulitis in his left foot unrelated to his previous surgery. He's been treated with a course of by mouth Keflex over the past week.    History  Smoking status  . Former Smoker -- 1.00 packs/day for 57 years  . Types: Cigarettes  Smokeless tobacco  . Former Neurosurgeon  . Types: Chew    Comment: smoked from the age of 60.  Quit 03/2012       No Known Allergies  Current Outpatient Prescriptions  Medication Sig Dispense Refill  . aspirin EC 325 MG EC tablet Take 1  tablet (325 mg total) by mouth daily.  30 tablet    . atorvastatin (LIPITOR) 80 MG tablet Take 1 tablet (80 mg total) by mouth daily at 6 PM.  30 tablet  1  . buprenorphine (BUTRANS - DOSED MCG/HR) 10 MCG/HR PTWK Place 10 mcg onto the skin once a week.      [name] buPROPion (WELLBUTRIN XL) 300 MG 24 hr tablet Take 150 mg by mouth daily.       . cephALEXin (KEFLEX) 500 MG capsule Take 1 capsule (500 mg total) by mouth 3 (three) times daily.  21 capsule  0  . cholecalciferol (VITAMIN D) 1000 UNITS tablet Take 1,000 Units by mouth daily.      . clonazePAM (KLONOPIN) 1 MG tablet 1 tab nightly      . furosemide (LASIX) 40 MG tablet Take 1 tablet (40 mg total) by mouth daily.  30 tablet  6  . Hydrocodone-Acetaminophen 7.5-300 MG TABS Take 1 tablet by mouth 3 (three) times daily as needed.  30 each  0  . metFORMIN (GLUCOPHAGE) 500 MG tablet Take 500 mg by mouth daily.      . metoprolol tartrate (LOPRESSOR) 25 MG tablet Take 0.5 tablets (12.5 mg total) by mouth 2 (two) times daily.  30 tablet  1  . oxyCODONE-acetaminophen (PERCOCET) 10-325 MG per tablet  Take 1 tablet by mouth every 4 (four) hours as needed for pain.  30 tablet  0  . pantoprazole (PROTONIX) 40 MG tablet Take 40 mg by mouth daily.      . temazepam (RESTORIL) 15 MG capsule       . Vilazodone HCl (VIIBRYD) 40 MG TABS Take 40 mg by mouth daily.       No current facility-administered medications for this visit.     Physical Exam: BP 136/83  Pulse 73  Resp 20  Ht 6\' 1"  (1.854 m)  Wt 238 lb (107.956 kg)  BMI 31.41 kg/m2  SpO2 98%  General appearance: alert and cooperative Neurologic: intact Heart: regular rate and rhythm, S1, S2 normal, no murmur, click, rub or gallop and normal apical impulse Lungs: clear to auscultation bilaterally and normal percussion bilaterally Abdomen: soft, non-tender; bowel sounds normal; no masses,  no organomegaly Extremities: extremities normal, atraumatic, no cyanosis or edema and Homans sign is negative,  no sign of DVT Wound: healing well Mild swelling and erythremia on dorsum of 2 3 4  left toes that was present last week is now completely healed and resolved He has a small amount of swelling in the right vein harvest site where the lymphocele was aspirated smaller than it was last week.    Diagnostic Studies & Laboratory data:         Recent Radiology Findings: No results found.    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     Timothy Cervantes B 11/23/2012 2:39 PM

## 2012-11-24 ENCOUNTER — Other Ambulatory Visit: Payer: Self-pay | Admitting: *Deleted

## 2012-11-24 MED ORDER — ATORVASTATIN CALCIUM 80 MG PO TABS: 80.0000 mg | ORAL_TABLET | Freq: Every day | ORAL | Status: DC

## 2012-11-27 ENCOUNTER — Encounter (HOSPITAL_COMMUNITY): Payer: Medicare Other

## 2012-11-29 ENCOUNTER — Encounter (HOSPITAL_COMMUNITY): Payer: Medicare Other

## 2012-11-30 ENCOUNTER — Other Ambulatory Visit: Payer: Self-pay | Admitting: Physician Assistant

## 2012-12-03 ENCOUNTER — Other Ambulatory Visit: Payer: Self-pay | Admitting: Physician Assistant

## 2012-12-04 ENCOUNTER — Other Ambulatory Visit: Payer: Self-pay | Admitting: *Deleted

## 2012-12-04 ENCOUNTER — Encounter (HOSPITAL_COMMUNITY): Payer: Medicare Other

## 2012-12-04 MED ORDER — METOPROLOL TARTRATE 25 MG PO TABS: 12.5000 mg | ORAL_TABLET | Freq: Two times a day (BID) | ORAL | Status: DC

## 2012-12-04 MED ORDER — ATORVASTATIN CALCIUM 80 MG PO TABS: 80.0000 mg | ORAL_TABLET | Freq: Every day | ORAL | Status: DC

## 2012-12-06 ENCOUNTER — Encounter (HOSPITAL_COMMUNITY): Payer: Medicare Other

## 2012-12-11 ENCOUNTER — Encounter (HOSPITAL_COMMUNITY): Payer: Medicare Other

## 2012-12-13 ENCOUNTER — Encounter (HOSPITAL_COMMUNITY): Payer: Medicare Other

## 2012-12-18 ENCOUNTER — Encounter (HOSPITAL_COMMUNITY): Payer: Medicare Other

## 2012-12-20 ENCOUNTER — Encounter (HOSPITAL_COMMUNITY): Payer: Medicare Other

## 2013-01-09 ENCOUNTER — Encounter: Payer: Self-pay | Admitting: Cardiovascular Disease

## 2013-01-24 ENCOUNTER — Encounter: Payer: Self-pay | Admitting: Cardiovascular Disease

## 2013-02-08 ENCOUNTER — Ambulatory Visit (INDEPENDENT_AMBULATORY_CARE_PROVIDER_SITE_OTHER): Payer: Medicare Other | Admitting: Cardiovascular Disease

## 2013-02-08 ENCOUNTER — Encounter: Payer: Self-pay | Admitting: Cardiovascular Disease

## 2013-02-08 ENCOUNTER — Other Ambulatory Visit: Payer: Self-pay | Admitting: *Deleted

## 2013-02-08 VITALS — BP 136/84 | HR 62 | Ht 73.0 in | Wt 249.8 lb

## 2013-02-08 DIAGNOSIS — E876 Hypokalemia: Secondary | ICD-10-CM

## 2013-02-08 DIAGNOSIS — I5032 Chronic diastolic (congestive) heart failure: Secondary | ICD-10-CM

## 2013-02-08 DIAGNOSIS — I2510 Atherosclerotic heart disease of native coronary artery without angina pectoris: Secondary | ICD-10-CM

## 2013-02-08 DIAGNOSIS — I10 Essential (primary) hypertension: Secondary | ICD-10-CM

## 2013-02-08 LAB — CBC WITH DIFFERENTIAL/PLATELET
Basophils Relative: 0.5 % (ref 0.0–3.0)
Eosinophils Relative: 3 % (ref 0.0–5.0)
HCT: 42.2 % (ref 39.0–52.0)
Hemoglobin: 14.1 g/dL (ref 13.0–17.0)
Lymphs Abs: 2.1 10*3/uL (ref 0.7–4.0)
MCV: 85.8 fl (ref 78.0–100.0)
Monocytes Absolute: 0.5 10*3/uL (ref 0.1–1.0)
Monocytes Relative: 7.8 % (ref 3.0–12.0)
Neutro Abs: 3.1 10*3/uL (ref 1.4–7.7)
Platelets: 221 10*3/uL (ref 150.0–400.0)
RBC: 4.92 Mil/uL (ref 4.22–5.81)
WBC: 5.8 10*3/uL (ref 4.5–10.5)

## 2013-02-08 LAB — BASIC METABOLIC PANEL
BUN: 30 mg/dL — ABNORMAL HIGH (ref 6–23)
Chloride: 106 mEq/L (ref 96–112)
Potassium: 3.3 mEq/L — ABNORMAL LOW (ref 3.5–5.1)
Sodium: 140 mEq/L (ref 135–145)

## 2013-02-08 MED ORDER — POTASSIUM CHLORIDE CRYS ER 20 MEQ PO TBCR: 40.0000 meq | EXTENDED_RELEASE_TABLET | Freq: Every day | ORAL | Status: DC

## 2013-02-08 NOTE — Patient Instructions (Addendum)
Your physician recommends that you schedule a follow-up appointment in:  6-8 weeks.   Your physician has requested that you have an echocardiogram. Echocardiography is a painless test that uses sound waves to create images of your heart. It provides your doctor with information about the size and shape of your heart and how well your heart's chambers and valves are working. This procedure takes approximately one hour. There are no restrictions for this procedure.   Your physician has recommended you make the following change in your medication:  Stop metoprolol

## 2013-02-08 NOTE — Progress Notes (Signed)
History of Present Illness: 72 y.o. male with history of CAD s/p 5V CABG March 2014, DM, HTN, HLD, former tobacco abuse who is here today for cardiac follow up. Admitted to [facility] 3/26-08/31/12 with a non-STEMI followed by CABG.  Cardiac cath 08/23/12 demonstrated severe 3 vessel CAD with preserved LV function. Echo 08/24/12: Mild LVH, EF 65-70%, mild RVE, PASP 36. Pre-op dopplers with no carotid stenosis. 5V CABG 08/25/12 (LIMA-LAD, SVG-DX, SVG-OM1/OM2, SVG-PDA). Postoperative course was fairly uneventful. He did have some volume overload after surgery and was treated with Lasix.   He is here today for follow up. He has had no exertional chest pains.LE edema resolved. Feeling better. He does describe fatigue. No energy. No chest pain or SOB. He has been moving to a new house. He thinks he has overdone it in regards to work around the house.   Primary Care Physician: [name]  Last Lipid Profile:Lipid Panel  (Most recent was in August 2014 in primary care with Total chol 140, HDL 57, LDL 61)    Component Value Date/Time   CHOL 107 10/31/2012 0821   TRIG 103.0 10/31/2012 0821   HDL 35.60* 10/31/2012 0821   CHOLHDL 3 10/31/2012 0821   VLDL 20.6 10/31/2012 0821   LDLCALC 51 10/31/2012 0821     Past Medical History  Diagnosis Date  . Diabetes mellitus without complication     a. Dx in 2010  . Hypertension     a. Dx in 2010  . Back pain, chronic   . DJD (degenerative joint disease)   . History of tobacco abuse     a. 57 year hx, quit 03/2012.  [name] Chronic pain   . Fibromyalgia   . CAD (coronary artery disease)     a. s/p NSTEMI 07/2012 => LHC with 3v CAD => s/p CABG 08/25/12 (LIMA-LAD, SVG-DX, SVG-OM1/OM2, SVG-PDA) Dr. Tyrone Sage;  b.  Echo 08/24/12:  Mild LVH, EF 65-70%, mild RVE, PASP 36  . Chronic back pain   . HLD (hyperlipidemia)     Past Surgical History  Procedure Laterality Date  . Right ankle surgery    . Cholecystectomy    . Bilateral rotator cuff repair    . Transurethral resection of  prostate    . Vocal cord tumor resection    . Skin cancer of left face - resected Left     parotid gland resection-non cancerous  . Coronary artery bypass graft N/A 08/25/2012    Procedure: CORONARY ARTERY BYPASS GRAFTING (CABG);  Surgeon: Delight Ovens, MD;  Location: [facility] OR;  Service: Open Heart Surgery;  Laterality: N/A;  Coronary artery bypass graft times five using left internal mammary artery and bilateral saphenous leg vein using endoscope.  . Intraoperative transesophageal echocardiogram N/A 08/25/2012    Procedure: INTRAOPERATIVE TRANSESOPHAGEAL ECHOCARDIOGRAM;  Surgeon: Delight Ovens, MD;  Location: [facility] OR;  Service: Open Heart Surgery;  Laterality: N/A;    Current Outpatient Prescriptions  Medication Sig Dispense Refill  . aspirin EC 325 MG EC tablet Take 1 tablet (325 mg total) by mouth daily.  30 tablet    . atorvastatin (LIPITOR) 80 MG tablet Take 1 tablet (80 mg total) by mouth daily at 6 PM.  30 tablet  1  . buprenorphine (BUTRANS - DOSED MCG/HR) 10 MCG/HR PTWK Place 10 mcg onto the skin once a week.      [name] buPROPion (WELLBUTRIN XL) 300 MG 24 hr tablet Take 150 mg by mouth daily.       [name]  cephALEXin (KEFLEX) 500 MG capsule Take 1 capsule (500 mg total) by mouth 3 (three) times daily.  21 capsule  0  . cholecalciferol (VITAMIN D) 1000 UNITS tablet Take 1,000 Units by mouth daily.      . clonazePAM (KLONOPIN) 1 MG tablet 1 tab nightly      . furosemide (LASIX) 40 MG tablet Take 1 tablet (40 mg total) by mouth daily.  30 tablet  6  . metFORMIN (GLUCOPHAGE) 500 MG tablet Take 500 mg by mouth daily.      . metoprolol tartrate (LOPRESSOR) 25 MG tablet Take 0.5 tablets (12.5 mg total) by mouth 2 (two) times daily.  30 tablet  1  . oxyCODONE-acetaminophen (PERCOCET) 10-325 MG per tablet Take 1 tablet by mouth every 4 (four) hours as needed for pain.  30 tablet  0  . pantoprazole (PROTONIX) 40 MG tablet Take 40 mg by mouth daily.      . temazepam (RESTORIL) 15 MG capsule       .  Vilazodone HCl (VIIBRYD) 40 MG TABS Take 40 mg by mouth daily.       No current facility-administered medications for this visit.    No Known Allergies  History   Social History  . Marital Status: Married    Spouse Name: N/A    Number of Children: N/A  . Years of Education: N/A   Occupational History  . Not on file.   Social History Main Topics  . Smoking status: Former Smoker -- 1.00 packs/day for 57 years    Types: Cigarettes  . Smokeless tobacco: Former Neurosurgeon    Types: Chew     Comment: smoked from the age of 49.  Quit 03/2012  . Alcohol Use: No  . Drug Use: No  . Sexual Activity: Not on file   Other Topics Concern  . Not on file   Social History Narrative   Lives in [place] with wife.  Retired.    Family History  Problem Relation Age of Onset  . Heart attack Father     died @ 23  . Lung cancer Mother     died @ 41  . Other Sister     [employer] in her 92's  . Bladder Cancer Brother     alive    Review of Systems:  As stated in the HPI and otherwise negative.   BP 136/84  Pulse 62  Ht 6\' 1"  (1.854 m)  Wt 249 lb 12.8 oz (113.309 kg)  BMI 32.96 kg/m2  Physical Examination: General: Well developed, well nourished, NAD HEENT: OP clear, mucus membranes moist SKIN: warm, dry. No rashes. Neuro: No focal deficits Musculoskeletal: Muscle strength 5/5 all ext Psychiatric: Mood and affect normal Neck: No JVD, no carotid bruits, no thyromegaly, no lymphadenopathy. Lungs:Clear bilaterally, no wheezes, rhonci, crackles Cardiovascular: Regular rate and rhythm. No murmurs, gallops or rubs. Abdomen:Soft. Bowel sounds present. Non-tender.  Extremities: No lower extremity edema. Pulses are 2 + in the bilateral DP/PT.  Assessment and Plan:   1. CAD: Stable. S/p CABG. He is on an ASA, statin and beta blocker. Recent fatigue. Will stop metoprolol. Will arrange echo to assess LVEF. Will check BMET, CBC and TSH. Continue daily exercise.   2. Chronic diastolic CHF: He reports  resolution of LE edema and abdominal bloating. Continue Lasix 40 mg po Qdaily.   3. HTN: BP stable.   4. Hyperlipidemia: On statin

## 2013-02-15 ENCOUNTER — Other Ambulatory Visit (INDEPENDENT_AMBULATORY_CARE_PROVIDER_SITE_OTHER): Payer: Medicare Other

## 2013-02-15 DIAGNOSIS — E876 Hypokalemia: Secondary | ICD-10-CM

## 2013-02-15 LAB — BASIC METABOLIC PANEL
CO2: 29 mEq/L (ref 19–32)
Calcium: 9.4 mg/dL (ref 8.4–10.5)
Chloride: 105 mEq/L (ref 96–112)
Creatinine, Ser: 0.9 mg/dL (ref 0.4–1.5)
Glucose, Bld: 149 mg/dL — ABNORMAL HIGH (ref 70–99)

## 2013-02-19 ENCOUNTER — Other Ambulatory Visit: Payer: Self-pay | Admitting: Cardiovascular Disease

## 2013-02-28 ENCOUNTER — Other Ambulatory Visit (HOSPITAL_COMMUNITY): Payer: Self-pay | Admitting: Cardiovascular Disease

## 2013-02-28 ENCOUNTER — Ambulatory Visit (HOSPITAL_COMMUNITY): Payer: Medicare Other | Attending: Cardiovascular Disease | Admitting: Radiology

## 2013-02-28 DIAGNOSIS — Z87891 Personal history of nicotine dependence: Secondary | ICD-10-CM | POA: Insufficient documentation

## 2013-02-28 DIAGNOSIS — I5032 Chronic diastolic (congestive) heart failure: Secondary | ICD-10-CM

## 2013-02-28 DIAGNOSIS — I079 Rheumatic tricuspid valve disease, unspecified: Secondary | ICD-10-CM | POA: Insufficient documentation

## 2013-02-28 DIAGNOSIS — I2589 Other forms of chronic ischemic heart disease: Secondary | ICD-10-CM | POA: Insufficient documentation

## 2013-02-28 DIAGNOSIS — I379 Nonrheumatic pulmonary valve disorder, unspecified: Secondary | ICD-10-CM | POA: Insufficient documentation

## 2013-02-28 DIAGNOSIS — I2510 Atherosclerotic heart disease of native coronary artery without angina pectoris: Secondary | ICD-10-CM

## 2013-02-28 DIAGNOSIS — M1990 Unspecified osteoarthritis, unspecified site: Secondary | ICD-10-CM | POA: Insufficient documentation

## 2013-02-28 DIAGNOSIS — I252 Old myocardial infarction: Secondary | ICD-10-CM | POA: Insufficient documentation

## 2013-02-28 DIAGNOSIS — I10 Essential (primary) hypertension: Secondary | ICD-10-CM | POA: Insufficient documentation

## 2013-02-28 NOTE — Progress Notes (Signed)
Echocardiogram performed.  

## 2013-03-29 ENCOUNTER — Encounter: Payer: Self-pay | Admitting: Cardiovascular Disease

## 2013-03-30 ENCOUNTER — Ambulatory Visit (INDEPENDENT_AMBULATORY_CARE_PROVIDER_SITE_OTHER): Payer: Medicare Other | Admitting: Cardiovascular Disease

## 2013-03-30 ENCOUNTER — Encounter: Payer: Self-pay | Admitting: Cardiovascular Disease

## 2013-03-30 VITALS — BP 122/62 | HR 92 | Ht 73.0 in | Wt 251.0 lb

## 2013-03-30 DIAGNOSIS — I10 Essential (primary) hypertension: Secondary | ICD-10-CM

## 2013-03-30 DIAGNOSIS — I5032 Chronic diastolic (congestive) heart failure: Secondary | ICD-10-CM

## 2013-03-30 DIAGNOSIS — I2510 Atherosclerotic heart disease of native coronary artery without angina pectoris: Secondary | ICD-10-CM

## 2013-03-30 NOTE — Patient Instructions (Signed)
Your physician wants you to follow-up in:  6 months. You will receive a reminder letter in the mail two months in advance. If you don't receive a letter, please call our office to schedule the follow-up appointment.   

## 2013-03-30 NOTE — Progress Notes (Signed)
History of Present Illness: 72 y.o. male with history of CAD s/p 5V CABG March 2014, DM, HTN, HLD, former tobacco abuse who is here today for cardiac follow up. Admitted to [facility] 3/26-08/31/12 with a non-STEMI followed by CABG.  Cardiac cath 08/23/12 demonstrated severe 3 vessel CAD with preserved LV function. Echo 08/24/12: Mild LVH, EF 65-70%, mild RVE, PASP 36. Pre-op dopplers with no carotid stenosis. 5V CABG 08/25/12 (LIMA-LAD, SVG-DX, SVG-OM1/OM2, SVG-PDA). Postoperative course was fairly uneventful. He did have some volume overload after surgery and was treated with Lasix. He had some fatigue at last visit. Echo 02/28/13 with normal LV size and function, LVEF=55-60%. BMET, CBC and TSH ok. Potassium was low. KDur started. Metoprolol was stopped.   He is here today for follow up. He has had no exertional chest pains. LE edema resolved. Feeling better. Fatigue is better. No chest pain or SOB.   Primary Care Physician: [name]  Last Lipid Profile:Lipid Panel     Component Value Date/Time   CHOL 107 10/31/2012 0821   TRIG 103.0 10/31/2012 0821   HDL 35.60* 10/31/2012 0821   CHOLHDL 3 10/31/2012 0821   VLDL 20.6 10/31/2012 0821   LDLCALC 51 10/31/2012 0821     Past Medical History  Diagnosis Date  . Diabetes mellitus without complication     a. Dx in 2010  . Hypertension     a. Dx in 2010  . Back pain, chronic   . DJD (degenerative joint disease)   . History of tobacco abuse     a. 57 year hx, quit 03/2012.  [name] Chronic pain   . Fibromyalgia   . CAD (coronary artery disease)     a. s/p NSTEMI 07/2012 => LHC with 3v CAD => s/p CABG 08/25/12 (LIMA-LAD, SVG-DX, SVG-OM1/OM2, SVG-PDA) Dr. Tyrone Sage;  b.  Echo 08/24/12:  Mild LVH, EF 65-70%, mild RVE, PASP 36  . Chronic back pain   . HLD (hyperlipidemia)     Past Surgical History  Procedure Laterality Date  . Right ankle surgery    . Cholecystectomy    . Bilateral rotator cuff repair    . Transurethral resection of prostate    . Vocal cord tumor  resection    . Skin cancer of left face - resected Left     parotid gland resection-non cancerous  . Coronary artery bypass graft N/A 08/25/2012    Procedure: CORONARY ARTERY BYPASS GRAFTING (CABG);  Surgeon: Delight Ovens, MD;  Location: [facility] OR;  Service: Open Heart Surgery;  Laterality: N/A;  Coronary artery bypass graft times five using left internal mammary artery and bilateral saphenous leg vein using endoscope.  . Intraoperative transesophageal echocardiogram N/A 08/25/2012    Procedure: INTRAOPERATIVE TRANSESOPHAGEAL ECHOCARDIOGRAM;  Surgeon: Delight Ovens, MD;  Location: [facility] OR;  Service: Open Heart Surgery;  Laterality: N/A;    Current Outpatient Prescriptions  Medication Sig Dispense Refill  . aspirin EC 325 MG EC tablet Take 1 tablet (325 mg total) by mouth daily.  30 tablet    . atorvastatin (LIPITOR) 80 MG tablet TAKE 1 TABLET BY MOUTH DAILY AT 6PM  30 tablet  6  . buprenorphine (BUTRANS - DOSED MCG/HR) 10 MCG/HR PTWK Place 10 mcg onto the skin once a week.      [name] buPROPion (WELLBUTRIN XL) 300 MG 24 hr tablet Take 150 mg by mouth daily.       . cephALEXin (KEFLEX) 500 MG capsule Take 1 capsule (500 mg total) by mouth 3 (three) times  daily.  21 capsule  0  . cholecalciferol (VITAMIN D) 1000 UNITS tablet Take 1,000 Units by mouth daily.      . clonazePAM (KLONOPIN) 1 MG tablet 1 tab nightly      . furosemide (LASIX) 40 MG tablet Take 1 tablet (40 mg total) by mouth daily.  30 tablet  6  . metFORMIN (GLUCOPHAGE) 500 MG tablet Take 500 mg by mouth daily.      [name] oxyCODONE-acetaminophen (PERCOCET) 10-325 MG per tablet Take 1 tablet by mouth every 4 (four) hours as needed for pain.  30 tablet  0  . pantoprazole (PROTONIX) 40 MG tablet Take 40 mg by mouth daily.      . potassium chloride SA (K-DUR,KLOR-CON) 20 MEQ tablet Take 2 tablets (40 mEq total) by mouth daily.  60 tablet  6  . temazepam (RESTORIL) 15 MG capsule       . Vilazodone HCl (VIIBRYD) 40 MG TABS Take 40 mg by mouth  daily.       No current facility-administered medications for this visit.    No Known Allergies  History   Social History  . Marital Status: Married    Spouse Name: N/A    Number of Children: N/A  . Years of Education: N/A   Occupational History  . Not on file.   Social History Main Topics  . Smoking status: Former Smoker -- 1.00 packs/day for 57 years    Types: Cigarettes  . Smokeless tobacco: Former Neurosurgeon    Types: Chew     Comment: smoked from the age of 67.  Quit 03/2012  . Alcohol Use: No  . Drug Use: No  . Sexual Activity: Not on file   Other Topics Concern  . Not on file   Social History Narrative   Lives in [place] with wife.  Retired.    Family History  Problem Relation Age of Onset  . Heart attack Father     died @ 76  . Lung cancer Mother     died @ 50  . Other Sister     A[employer]W in her 24's  . Bladder Cancer Brother     alive    Review of Systems:  As stated in the HPI and otherwise negative.   BP 122/62  Pulse 92  Ht 6\' 1"  (1.854 m)  Wt 251 lb (113.853 kg)  BMI 33.12 kg/m2  SpO2 98%  Physical Examination: General: Well developed, well nourished, NAD HEENT: OP clear, mucus membranes moist SKIN: warm, dry. No rashes. Neuro: No focal deficits Musculoskeletal: Muscle strength 5/5 all ext Psychiatric: Mood and affect normal Neck: No JVD, no carotid bruits, no thyromegaly, no lymphadenopathy. Lungs:Clear bilaterally, no wheezes, rhonci, crackles Cardiovascular: Regular rate and rhythm. No murmurs, gallops or rubs. Abdomen:Soft. Bowel sounds present. Non-tender.  Extremities: No lower extremity edema. Pulses are 2 + in the bilateral DP/PT.  Echo 02/28/13: Left ventricle: The cavity size was normal. Wall thickness was increased in a pattern of mild LVH. Systolic function was normal. The estimated ejection fraction was in the range of 55% to 60%. Wall motion was normal; there were no regional wall motion abnormalities. - Left atrium: The atrium  was mildly dilated. - Atrial septum: No defect or patent foramen ovale was identified.  Assessment and Plan:   1. CAD: Stable. S/p CABG. He is on an ASA, statin. Metoprolol stopped at last visit. Echo with normal LVEF. Continue daily exercise.   2. Chronic diastolic CHF: LE edema is improved.  Continue Lasix 40 mg po Qdaily.   3. HTN: BP stable.   4. Hyperlipidemia: On statin

## 2013-05-15 ENCOUNTER — Telehealth: Payer: Self-pay | Admitting: Cardiovascular Disease

## 2013-05-15 NOTE — Telephone Encounter (Signed)
New problem:  Pt is c/o pain/ cramps from his knees down, from his elbows down to his hands, and sometimes in his back... Pt would like a call back advising him on what he should do.Marland KitchenMarland Kitchen

## 2013-05-15 NOTE — Telephone Encounter (Signed)
Called patient back. He is complaining of cramps on and off in ankles,hands,feet, and back on and off times 2 weeks. Advised him to call  Dr.Pang (PCP) for advice. If needed he will call back to see cardiology if Dr. Ricki Miller feels that it is necessary.

## 2013-06-04 ENCOUNTER — Encounter: Payer: Self-pay | Admitting: Cardiovascular Disease

## 2013-06-04 ENCOUNTER — Ambulatory Visit (INDEPENDENT_AMBULATORY_CARE_PROVIDER_SITE_OTHER): Payer: Medicare Other | Admitting: Cardiovascular Disease

## 2013-06-04 ENCOUNTER — Telehealth: Payer: Self-pay | Admitting: Cardiovascular Disease

## 2013-06-04 VITALS — BP 142/70 | HR 70 | Ht 73.0 in | Wt 240.0 lb

## 2013-06-04 DIAGNOSIS — I10 Essential (primary) hypertension: Secondary | ICD-10-CM

## 2013-06-04 DIAGNOSIS — I2510 Atherosclerotic heart disease of native coronary artery without angina pectoris: Secondary | ICD-10-CM

## 2013-06-04 DIAGNOSIS — I5032 Chronic diastolic (congestive) heart failure: Secondary | ICD-10-CM

## 2013-06-04 DIAGNOSIS — R079 Chest pain, unspecified: Secondary | ICD-10-CM

## 2013-06-04 MED ORDER — NITROGLYCERIN 0.4 MG SL SUBL: 0.4000 mg | SUBLINGUAL_TABLET | SUBLINGUAL | Status: AC | PRN

## 2013-06-04 NOTE — Telephone Encounter (Signed)
Called patient back. He states that he built a deck over the weekend and felt OK but has some fatigue and SOB when going up the hill.  He describes jaw pain prior to his MI last year.  Had jaw pain during church services and at a funeral yesterday and again today all without exertion lasting 2 to 3 minutes. Planning to drive to [place] today but is concerned. Does not have any NTG at home Advised will speak with Dr.McAlhany and call him back.

## 2013-06-04 NOTE — Patient Instructions (Addendum)
Your physician recommends that you schedule a follow-up appointment in:  4 weeks.   Your physician has requested that you have an exercise stress myoview. For further information please visit [email or web address]. Please follow instruction sheet, as given.  A prescription for Nitroglycerin has been sent to your pharmacy for you to use if needed.

## 2013-06-04 NOTE — Telephone Encounter (Signed)
New message    C/o jaw pain---off and on.  Pt states he does not feel good.  Had heart surgery march 2014.  Jaw pain has been one time on sat, one time on sun, and one time this am--does not take nitro.  Want a nurse to call him

## 2013-06-04 NOTE — Telephone Encounter (Signed)
Discussed above with Dr.McAlhany and he would like to see the patient at 3 pm today. I called Mr. Timothy Cervantes back and he agreed to come to the office today.

## 2013-06-04 NOTE — Progress Notes (Signed)
History of Present Illness: 73 y.o. male with history of CAD s/p 5V CABG March 2014, DM, HTN, HLD, former tobacco abuse who is here today for cardiac follow up. Admitted to [facility] 3/26-08/31/12 with a non-STEMI followed by CABG.  Cardiac cath 08/23/12 demonstrated severe 3 vessel CAD with preserved LV function. Echo 08/24/12: Mild LVH, EF 65-70%, mild RVE, PASP 36. Pre-op dopplers with no carotid stenosis. 5V CABG 08/25/12 (LIMA-LAD, SVG-DX, SVG-OM1/OM2, SVG-PDA). Postoperative course was fairly uneventful. He did have some volume overload after surgery and was treated with Lasix. He had some fatigue at last visit. Echo 02/28/13 with normal LV size and function, LVEF=55-60%. BMET, CBC and TSH ok. Potassium was low. KDur started. Metoprolol was stopped.   He is here today for follow up. He has been very busy lately. He built a deck this weekend. He did well until yesterday when he began to have right sided chest pain and jaw pain. This is new after his bypass surgery. He has no associated SOB, dizziness. He does feel that he has heartburn.   Primary Care Physician: [name]  Last Lipid Profile:Lipid Panel     Component Value Date/Time   CHOL 107 10/31/2012 0821   TRIG 103.0 10/31/2012 0821   HDL 35.60* 10/31/2012 0821   CHOLHDL 3 10/31/2012 0821   VLDL 20.6 10/31/2012 0821   LDLCALC 51 10/31/2012 0821     Past Medical History  Diagnosis Date  . Diabetes mellitus without complication     a. Dx in 2010  . Hypertension     a. Dx in 2010  . Back pain, chronic   . DJD (degenerative joint disease)   . History of tobacco abuse     a. 57 year hx, quit 03/2012.  [name] Chronic pain   . Fibromyalgia   . CAD (coronary artery disease)     a. s/p NSTEMI 07/2012 => LHC with 3v CAD => s/p CABG 08/25/12 (LIMA-LAD, SVG-DX, SVG-OM1/OM2, SVG-PDA) Dr. Tyrone Sage;  b.  Echo 08/24/12:  Mild LVH, EF 65-70%, mild RVE, PASP 36  . Chronic back pain   . HLD (hyperlipidemia)     Past Surgical History  Procedure Laterality Date  .  Right ankle surgery    . Cholecystectomy    . Bilateral rotator cuff repair    . Transurethral resection of prostate    . Vocal cord tumor resection    . Skin cancer of left face - resected Left     parotid gland resection-non cancerous  . Coronary artery bypass graft N/A 08/25/2012    Procedure: CORONARY ARTERY BYPASS GRAFTING (CABG);  Surgeon: Delight Ovens, MD;  Location: [facility] OR;  Service: Open Heart Surgery;  Laterality: N/A;  Coronary artery bypass graft times five using left internal mammary artery and bilateral saphenous leg vein using endoscope.  . Intraoperative transesophageal echocardiogram N/A 08/25/2012    Procedure: INTRAOPERATIVE TRANSESOPHAGEAL ECHOCARDIOGRAM;  Surgeon: Delight Ovens, MD;  Location: [facility] OR;  Service: Open Heart Surgery;  Laterality: N/A;    Current Outpatient Prescriptions  Medication Sig Dispense Refill  . aspirin EC 325 MG EC tablet Take 1 tablet (325 mg total) by mouth daily.  30 tablet    . atorvastatin (LIPITOR) 80 MG tablet TAKE 1 TABLET BY MOUTH DAILY AT 6PM  30 tablet  6  . buprenorphine (BUTRANS - DOSED MCG/HR) 10 MCG/HR PTWK Place 10 mcg onto the skin once a week.      [name] buPROPion (WELLBUTRIN XL) 300 MG 24 hr tablet  Take 150 mg by mouth daily.       . cephALEXin (KEFLEX) 500 MG capsule Take 1 capsule (500 mg total) by mouth 3 (three) times daily.  21 capsule  0  . cholecalciferol (VITAMIN D) 1000 UNITS tablet Take 1,000 Units by mouth daily.      . clonazePAM (KLONOPIN) 1 MG tablet 1 tab nightly      . furosemide (LASIX) 40 MG tablet Take 1 tablet (40 mg total) by mouth daily.  30 tablet  6  . metFORMIN (GLUCOPHAGE) 500 MG tablet Take 500 mg by mouth daily.      [name]. oxyCODONE-acetaminophen (PERCOCET) 10-325 MG per tablet Take 1 tablet by mouth every 4 (four) hours as needed for pain.  30 tablet  0  . pantoprazole (PROTONIX) 40 MG tablet Take 40 mg by mouth 2 (two) times daily.       . potassium chloride SA (K-DUR,KLOR-CON) 20 MEQ tablet Take 2  tablets (40 mEq total) by mouth daily.  60 tablet  6  . temazepam (RESTORIL) 15 MG capsule       . Vilazodone HCl (VIIBRYD) 40 MG TABS Take 40 mg by mouth daily.       No current facility-administered medications for this visit.    No Known Allergies  History   Social History  . Marital Status: Married    Spouse Name: N/A    Number of Children: N/A  . Years of Education: N/A   Occupational History  . Not on file.   Social History Main Topics  . Smoking status: Former Smoker -- 1.00 packs/day for 57 years    Types: Cigarettes  . Smokeless tobacco: Former NeurosurgeonUser    Types: Chew     Comment: smoked from the age of 73.  Quit 03/2012  . Alcohol Use: No  . Drug Use: No  . Sexual Activity: Not on file   Other Topics Concern  . Not on file   Social History Narrative   [place] in [place].  Retired.    Family History  Problem Relation Age of Onset  . Heart attack Father     died @ 8859  . Lung cancer Mother     died @ 785  . Other Sister     [employer] in her 6170's  . Bladder Cancer Brother     alive    Review of Systems:  As stated in the HPI and otherwise negative.   BP 142/70  Pulse 70  Ht 6\' 1"  (1.854 m)  Wt 240 lb (108.863 kg)  BMI 31.67 kg/m2  Physical Examination: General: Well developed, well nourished, NAD HEENT: OP clear, mucus membranes moist SKIN: warm, dry. No rashes. Neuro: No focal deficits Musculoskeletal: Muscle strength 5/5 all ext Psychiatric: Mood and affect normal Neck: No JVD, no carotid bruits, no thyromegaly, no lymphadenopathy. Lungs:Clear bilaterally, no wheezes, rhonci, crackles Cardiovascular: Regular rate and rhythm. No murmurs, gallops or rubs. Abdomen:Soft. Bowel sounds present. Non-tender.  Extremities: No lower extremity edema. Pulses are 2 + in the bilateral DP/PT.  Echo 02/28/13: Left ventricle: The cavity size was normal. Wall thickness was increased in a pattern of mild LVH. Systolic function was normal. The estimated ejection  fraction was in the range of 55% to 60%. Wall motion was normal; there were no regional wall motion abnormalities. - Left atrium: The atrium was mildly dilated. - Atrial septum: No defect or patent foramen ovale was Identified.  EKG: NSR, rate 70 bpm. Non-specific T wave  abnormality inferior leads.   Assessment and Plan:   1. CAD: Recent chest pain. S/p CABG. He is on an ASA, statin. He has been off of beta blocker due to fatigue. Echo with normal LVEF. Will arrange exercise stress myoview to exclude ischemia.   2. Chronic diastolic CHF: LE edema is stable. Continue Lasix 40 mg po Qdaily.   3. HTN: BP stable.   4. Hyperlipidemia: On statin  5. Chest pain: see above.

## 2013-06-20 ENCOUNTER — Encounter: Payer: Self-pay | Admitting: Cardiology

## 2013-06-20 ENCOUNTER — Ambulatory Visit (HOSPITAL_COMMUNITY): Payer: Medicare Other | Attending: Cardiology | Admitting: Radiology

## 2013-06-20 VITALS — BP 117/79 | HR 64 | Ht 73.0 in | Wt 246.0 lb

## 2013-06-20 DIAGNOSIS — Z8249 Family history of ischemic heart disease and other diseases of the circulatory system: Secondary | ICD-10-CM | POA: Insufficient documentation

## 2013-06-20 DIAGNOSIS — R5381 Other malaise: Secondary | ICD-10-CM | POA: Insufficient documentation

## 2013-06-20 DIAGNOSIS — R5383 Other fatigue: Secondary | ICD-10-CM

## 2013-06-20 DIAGNOSIS — I252 Old myocardial infarction: Secondary | ICD-10-CM | POA: Insufficient documentation

## 2013-06-20 DIAGNOSIS — Z951 Presence of aortocoronary bypass graft: Secondary | ICD-10-CM | POA: Insufficient documentation

## 2013-06-20 DIAGNOSIS — I10 Essential (primary) hypertension: Secondary | ICD-10-CM | POA: Insufficient documentation

## 2013-06-20 DIAGNOSIS — R6884 Jaw pain: Secondary | ICD-10-CM | POA: Insufficient documentation

## 2013-06-20 DIAGNOSIS — R079 Chest pain, unspecified: Secondary | ICD-10-CM

## 2013-06-20 DIAGNOSIS — Z87891 Personal history of nicotine dependence: Secondary | ICD-10-CM | POA: Insufficient documentation

## 2013-06-20 DIAGNOSIS — E119 Type 2 diabetes mellitus without complications: Secondary | ICD-10-CM | POA: Insufficient documentation

## 2013-06-20 DIAGNOSIS — I2510 Atherosclerotic heart disease of native coronary artery without angina pectoris: Secondary | ICD-10-CM | POA: Insufficient documentation

## 2013-06-20 MED ORDER — TECHNETIUM TC 99M SESTAMIBI GENERIC - CARDIOLITE
33.0000 | Freq: Once | INTRAVENOUS | Status: AC | PRN
  Administered 2013-06-20: 33 via INTRAVENOUS

## 2013-06-20 MED ORDER — TECHNETIUM TC 99M SESTAMIBI GENERIC - CARDIOLITE
10.8000 | Freq: Once | INTRAVENOUS | Status: AC | PRN
  Administered 2013-06-20: 11 via INTRAVENOUS

## 2013-06-20 NOTE — Progress Notes (Signed)
[facility] SITE 3 NUCLEAR MED [address] [place], [place] [address] [phone]    Cardiology Nuclear Med Study  Timothy MemosWayne T Lindenbaum is a 73 y.o. male     MRN : [id number]     DOB: 1941/01/27  Procedure Date: 06/20/2013  Nuclear Med Background Indication for Stress Test:  Evaluation for Ischemia and Graft Patency History:  CAD, MI, Cath, CABG x5, Echo 2014 EF 55-60%, MPI ~20 yrs ago Cardiac Risk Factors: Family History - CAD, History of Smoking, Hypertension and NIDDM  Symptoms:  Chest Pain with right sided jaw pain and Fatigue   Nuclear Pre-Procedure Caffeine/Decaff Intake:  1 small sip of coke at 6 am NPO After: 5:30pm   Lungs:  clear O2 Sat: 97% on room air. IV 0.9% NS with Angio Cath:  22g  IV Site: R Hand  IV Started by:  Bonnita LevanJackie Smith, RN  Chest Size (in):  50 Cup Size: n/a  Height: 6\' 1"  (1.854 m)  Weight:  246 lb (111.585 kg)  BMI:  Body mass index is 32.46 kg/(m^2). Tech Comments:  N/A    Nuclear Med Study 1 or 2 day study: 1 day  Stress Test Type:  Stress  Reading MD: N/A  Order Authorizing Provider: Verne Carrowhristopher McAlhany, MD  Resting Radionuclide: Technetium 3732m Sestamibi  Resting Radionuclide Dose: 11.0 mCi   Stress Radionuclide:  Technetium 9032m Sestamibi  Stress Radionuclide Dose: 33.0 mCi           Stress Protocol Rest HR: 64 Stress HR: 139  Rest BP: 117/79 Stress BP: 165/61  Exercise Time (min): 7:00 METS: 8.5           Dose of Adenosine (mg):  n/a Dose of Lexiscan: n/a mg  Dose of Atropine (mg): n/a Dose of Dobutamine: n/a mcg/kg/min (at max HR)  Stress Test Technologist: Nelson ChimesSharon Brooks, BS-ES  Nuclear Technologist:  Domenic PoliteStephen Carbone, CNMT     Rest Procedure:  Myocardial perfusion imaging was performed at rest 45 minutes following the intravenous administration of Technetium 4832m Sestamibi. Rest ECG: LAFB  Stress Procedure:  The patient exercised on the treadmill utilizing the Bruce Protocol for 7:00 minutes. The patient stopped due to  fatigue and denied any chest pain.  Technetium 732m Sestamibi was injected at peak exercise and myocardial perfusion imaging was performed after a brief delay. Stress ECG: No significant change from baseline ECG  QPS Raw Data Images:  Normal; no motion artifact; normal heart/lung ratio. Stress Images:  Normal homogeneous uptake in all areas of the myocardium. Rest Images:  Normal homogeneous uptake in all areas of the myocardium. Subtraction (SDS):  No evidence of ischemia. Transient Ischemic Dilatation (Normal <1.22):  0.95 Lung/Heart Ratio (Normal <0.45):  0.34  Quantitative Gated Spect Images QGS EDV:  114 ml QGS ESV:  52 ml  Impression Exercise Capacity:  Good exercise capacity. BP Response:  Normal blood pressure response. Clinical Symptoms:  No symptoms. ECG Impression:  No significant ST segment change suggestive of ischemia. Comparison with Prior Nuclear Study: No images to compare  Overall Impression:  Normal stress nuclear study.  LV Ejection Fraction: 54%.  LV Wall Motion:  NL LV Function; NL Wall Motion  Limited Brandshomas Michille Mcelrath

## 2013-06-28 ENCOUNTER — Other Ambulatory Visit: Payer: Self-pay | Admitting: Cardiovascular Disease

## 2013-07-05 ENCOUNTER — Ambulatory Visit (INDEPENDENT_AMBULATORY_CARE_PROVIDER_SITE_OTHER): Payer: Medicare Other | Admitting: Cardiovascular Disease

## 2013-07-05 ENCOUNTER — Encounter (INDEPENDENT_AMBULATORY_CARE_PROVIDER_SITE_OTHER): Payer: Self-pay

## 2013-07-05 ENCOUNTER — Encounter: Payer: Self-pay | Admitting: Cardiovascular Disease

## 2013-07-05 VITALS — BP 117/70 | HR 78 | Ht 73.0 in | Wt 252.0 lb

## 2013-07-05 DIAGNOSIS — I2510 Atherosclerotic heart disease of native coronary artery without angina pectoris: Secondary | ICD-10-CM

## 2013-07-05 DIAGNOSIS — I10 Essential (primary) hypertension: Secondary | ICD-10-CM

## 2013-07-05 DIAGNOSIS — E785 Hyperlipidemia, unspecified: Secondary | ICD-10-CM

## 2013-07-05 DIAGNOSIS — I5032 Chronic diastolic (congestive) heart failure: Secondary | ICD-10-CM

## 2013-07-05 NOTE — Patient Instructions (Signed)
Your physician wants you to follow-up in:  6 months. You will receive a reminder letter in the mail two months in advance. If you don't receive a letter, please call our office to schedule the follow-up appointment.   

## 2013-07-05 NOTE — Progress Notes (Signed)
History of Present Illness: 73 y.o. male with history of CAD s/p 5V CABG March 2014, DM, HTN, HLD, former tobacco abuse who is here today for cardiac follow up. Admitted to [facility] 3/26-08/31/12 with a non-STEMI followed by CABG.  Cardiac cath 08/23/12 demonstrated severe 3 vessel CAD with preserved LV function. Echo 08/24/12: Mild LVH, EF 65-70%, mild RVE, PASP 36. Pre-op dopplers with no carotid stenosis. 5V CABG 08/25/12 (LIMA-LAD, SVG-DX, SVG-OM1/OM2, SVG-PDA). Postoperative course was fairly uneventful. He did have some volume overload after surgery and was treated with Lasix. Also some fatigue. Echo 02/28/13 with normal LV size and function, LVEF=55-60%. BMET, CBC and TSH ok. Potassium was low. KDur started. Metoprolol was stopped. I saw him 06/04/13 and he had c/o right sided chest pain and jaw pain. This occurred after a long weekend of manual labor building a deck. No recurrence since then. I arranged a stress myoview on 06/20/13 which showed no ischemia. He exercised for 7 minutes without chest pain.   He is here today for follow up. He has no chest pain. He has noticed fatigue. No specific complaints.   Primary Care Physician: [name]  Last Lipid Profile:Lipid Panel     Component Value Date/Time   CHOL 107 10/31/2012 0821   TRIG 103.0 10/31/2012 0821   HDL 35.60* 10/31/2012 0821   CHOLHDL 3 10/31/2012 0821   VLDL 20.6 10/31/2012 0821   LDLCALC 51 10/31/2012 0821     Past Medical History  Diagnosis Date  . Diabetes mellitus without complication     a. Dx in 2010  . Hypertension     a. Dx in 2010  . Back pain, chronic   . DJD (degenerative joint disease)   . History of tobacco abuse     a. 57 year hx, quit 03/2012.  [name] Chronic pain   . Fibromyalgia   . CAD (coronary artery disease)     a. s/p NSTEMI 07/2012 => LHC with 3v CAD => s/p CABG 08/25/12 (LIMA-LAD, SVG-DX, SVG-OM1/OM2, SVG-PDA) Dr. Tyrone Sage;  b.  Echo 08/24/12:  Mild LVH, EF 65-70%, mild RVE, PASP 36  . Chronic back pain   . HLD  (hyperlipidemia)     Past Surgical History  Procedure Laterality Date  . Right ankle surgery    . Cholecystectomy    . Bilateral rotator cuff repair    . Transurethral resection of prostate    . Vocal cord tumor resection    . Skin cancer of left face - resected Left     parotid gland resection-non cancerous  . Coronary artery bypass graft N/A 08/25/2012    Procedure: CORONARY ARTERY BYPASS GRAFTING (CABG);  Surgeon: Delight Ovens, MD;  Location: [facility] OR;  Service: Open Heart Surgery;  Laterality: N/A;  Coronary artery bypass graft times five using left internal mammary artery and bilateral saphenous leg vein using endoscope.  . Intraoperative transesophageal echocardiogram N/A 08/25/2012    Procedure: INTRAOPERATIVE TRANSESOPHAGEAL ECHOCARDIOGRAM;  Surgeon: Delight Ovens, MD;  Location: [facility] OR;  Service: Open Heart Surgery;  Laterality: N/A;    Current Outpatient Prescriptions  Medication Sig Dispense Refill  . aspirin EC 325 MG EC tablet Take 1 tablet (325 mg total) by mouth daily.  30 tablet    . atorvastatin (LIPITOR) 80 MG tablet TAKE 1 TABLET BY MOUTH DAILY AT 6PM  30 tablet  6  . buprenorphine (BUTRANS - DOSED MCG/HR) 10 MCG/HR PTWK Place 10 mcg onto the skin once a week.      [name]  buPROPion (WELLBUTRIN XL) 300 MG 24 hr tablet Take 150 mg by mouth daily.       . cephALEXin (KEFLEX) 500 MG capsule Take 1 capsule (500 mg total) by mouth 3 (three) times daily.  21 capsule  0  . cholecalciferol (VITAMIN D) 1000 UNITS tablet Take 1,000 Units by mouth daily.      . clonazePAM (KLONOPIN) 1 MG tablet 1 tab nightly      . furosemide (LASIX) 40 MG tablet TAKE 1 TABLET BY MOUTH DAILY  30 tablet  0  . metFORMIN (GLUCOPHAGE) 500 MG tablet Take 500 mg by mouth daily.      . nitroGLYCERIN (NITROSTAT) 0.4 MG SL tablet Place 1 tablet (0.4 mg total) under the tongue every 5 (five) minutes as needed for chest pain.  25 tablet  6  . oxyCODONE-acetaminophen (PERCOCET) 10-325 MG per tablet Take 1  tablet by mouth every 4 (four) hours as needed for pain.  30 tablet  0  . pantoprazole (PROTONIX) 40 MG tablet Take 40 mg by mouth 2 (two) times daily.       . potassium chloride SA (K-DUR,KLOR-CON) 20 MEQ tablet Take 2 tablets (40 mEq total) by mouth daily.  60 tablet  6  . temazepam (RESTORIL) 15 MG capsule       . Vilazodone HCl (VIIBRYD) 40 MG TABS Take 40 mg by mouth daily.       No current facility-administered medications for this visit.    No Known Allergies  History   Social History  . Marital Status: Married    Spouse Name: N/A    Number of Children: N/A  . Years of Education: N/A   Occupational History  . Not on file.   Social History Main Topics  . Smoking status: Former Smoker -- 1.00 packs/day for 57 years    Types: Cigarettes  . Smokeless tobacco: Former Neurosurgeon    Types: Chew     Comment: smoked from the age of 69.  Quit 03/2012  . Alcohol Use: No  . Drug Use: No  . Sexual Activity: Not on file   Other Topics Concern  . Not on file   Social History Narrative   Lives in [place] with wife.  Retired.    Family History  Problem Relation Age of Onset  . Heart attack Father     died @ 71  . Lung cancer Mother     died @ 77  . Other Sister     A&[employer] in her 78's  . Bladder Cancer Brother     alive    Review of Systems:  As stated in the HPI and otherwise negative.   BP 117/70  Pulse 78  Ht 6\' 1"  (1.854 m)  Wt 252 lb (114.306 kg)  BMI 33.25 kg/m2  Physical Examination: General: Well developed, well nourished, NAD HEENT: OP clear, mucus membranes moist SKIN: warm, dry. No rashes. Neuro: No focal deficits Musculoskeletal: Muscle strength 5/5 all ext Psychiatric: Mood and affect normal Neck: No JVD, no carotid bruits, no thyromegaly, no lymphadenopathy. Lungs:Clear bilaterally, no wheezes, rhonci, crackles Cardiovascular: Regular rate and rhythm. No murmurs, gallops or rubs. Abdomen:Soft. Bowel sounds present. Non-tender.  Extremities: No lower  extremity edema. Pulses are 2 + in the bilateral DP/PT.  Exercise stress myoview 06/20/13: Stress Procedure: The patient exercised on the treadmill utilizing the Bruce Protocol for 7:00 minutes. The patient stopped due to fatigue and denied any chest pain. Technetium 81m Sestamibi was injected at peak exercise  and myocardial perfusion imaging was performed after a brief delay.  Stress ECG: No significant change from baseline ECG  QPS  Raw Data Images: Normal; no motion artifact; normal heart/lung ratio.  Stress Images: Normal homogeneous uptake in all areas of the myocardium.  Rest Images: Normal homogeneous uptake in all areas of the myocardium.  Subtraction (SDS): No evidence of ischemia.  Transient Ischemic Dilatation (Normal <1.22): 0.95  Lung/Heart Ratio (Normal <0.45): 0.34  Quantitative Gated Spect Images  QGS EDV: 114 ml  QGS ESV: 52 ml  Impression  Exercise Capacity: Good exercise capacity.  BP Response: Normal blood pressure response.  Clinical Symptoms: No symptoms.  ECG Impression: No significant ST segment change suggestive of ischemia.  Comparison with Prior Nuclear Study: No images to compare  Overall Impression: Normal stress nuclear study.  LV Ejection Fraction: 54%. LV Wall Motion: NL LV Function; NL Wall Motion   Assessment and Plan:   1. CAD: Stable. Recent chest pain without ischemia on stress test 06/20/13. He is on an ASA, statin. He has been off of beta blocker due to fatigue.   2. Chronic diastolic CHF: LE edema is stable. Continue Lasix 40 mg po Qdaily.   3. HTN: BP stable.   4. Hyperlipidemia: On statin

## 2013-08-11 ENCOUNTER — Other Ambulatory Visit: Payer: Self-pay | Admitting: Cardiovascular Disease

## 2013-09-17 ENCOUNTER — Telehealth: Payer: Self-pay | Admitting: Cardiovascular Disease

## 2013-09-17 DIAGNOSIS — I5032 Chronic diastolic (congestive) heart failure: Secondary | ICD-10-CM

## 2013-09-17 MED ORDER — FUROSEMIDE 40 MG PO TABS: 40.0000 mg | ORAL_TABLET | Freq: Two times a day (BID) | ORAL | Status: DC

## 2013-09-17 NOTE — Telephone Encounter (Signed)
Pt notified. He will come in for lab work on September 24, 2013. Will send new prescription to [facility] on [address]

## 2013-09-17 NOTE — Telephone Encounter (Signed)
Spoke with pt who reports swelling in both feet and ankles. Right greater than left. This has been a gradual increase. He describes as ankles and feet feeling very tight.  Reports stinging around the ankle. He is up and about all day long but keeps feet elevated when in the chair in the evening. Swelling improves overnight but worsens as day goes on. He is taking furosemide 40 mg by mouth daily.

## 2013-09-17 NOTE — Telephone Encounter (Signed)
New Message  Pt called. States that he is experiencing alot of swelling in his right foot and ankle. He is requesting a call back to discuss if a change in medication will help. Please call

## 2013-09-17 NOTE — Telephone Encounter (Signed)
WE can increase lasix to 40 mg po BID and then check BMET in one week. cdm

## 2013-09-24 ENCOUNTER — Other Ambulatory Visit: Payer: Medicare Other

## 2013-09-25 ENCOUNTER — Other Ambulatory Visit (INDEPENDENT_AMBULATORY_CARE_PROVIDER_SITE_OTHER): Payer: Medicare Other

## 2013-09-25 DIAGNOSIS — I5032 Chronic diastolic (congestive) heart failure: Secondary | ICD-10-CM

## 2013-09-25 LAB — BASIC METABOLIC PANEL
BUN: 26 mg/dL — AB (ref 6–23)
CHLORIDE: 104 meq/L (ref 96–112)
CO2: 24 mEq/L (ref 19–32)
CREATININE: 1 mg/dL (ref 0.4–1.5)
Calcium: 9 mg/dL (ref 8.4–10.5)
GFR: 81.15 mL/min (ref >60.00)
Glucose, Bld: 193 mg/dL — ABNORMAL HIGH (ref 70–99)
Potassium: 4.1 mEq/L (ref 3.5–5.1)
Sodium: 136 mEq/L (ref 135–145)

## 2013-09-27 ENCOUNTER — Other Ambulatory Visit: Payer: Self-pay | Admitting: Cardiovascular Disease

## 2013-10-16 ENCOUNTER — Other Ambulatory Visit: Payer: Self-pay | Admitting: Cardiovascular Disease

## 2013-10-30 IMAGING — CR DG CHEST 2V
2 series · 2 of 2 positions shown · non-contrast
Comparison: 09/28/2012.

CLINICAL DATA: Recent CABG.

CHEST - 2 VIEW

[w chest pa]
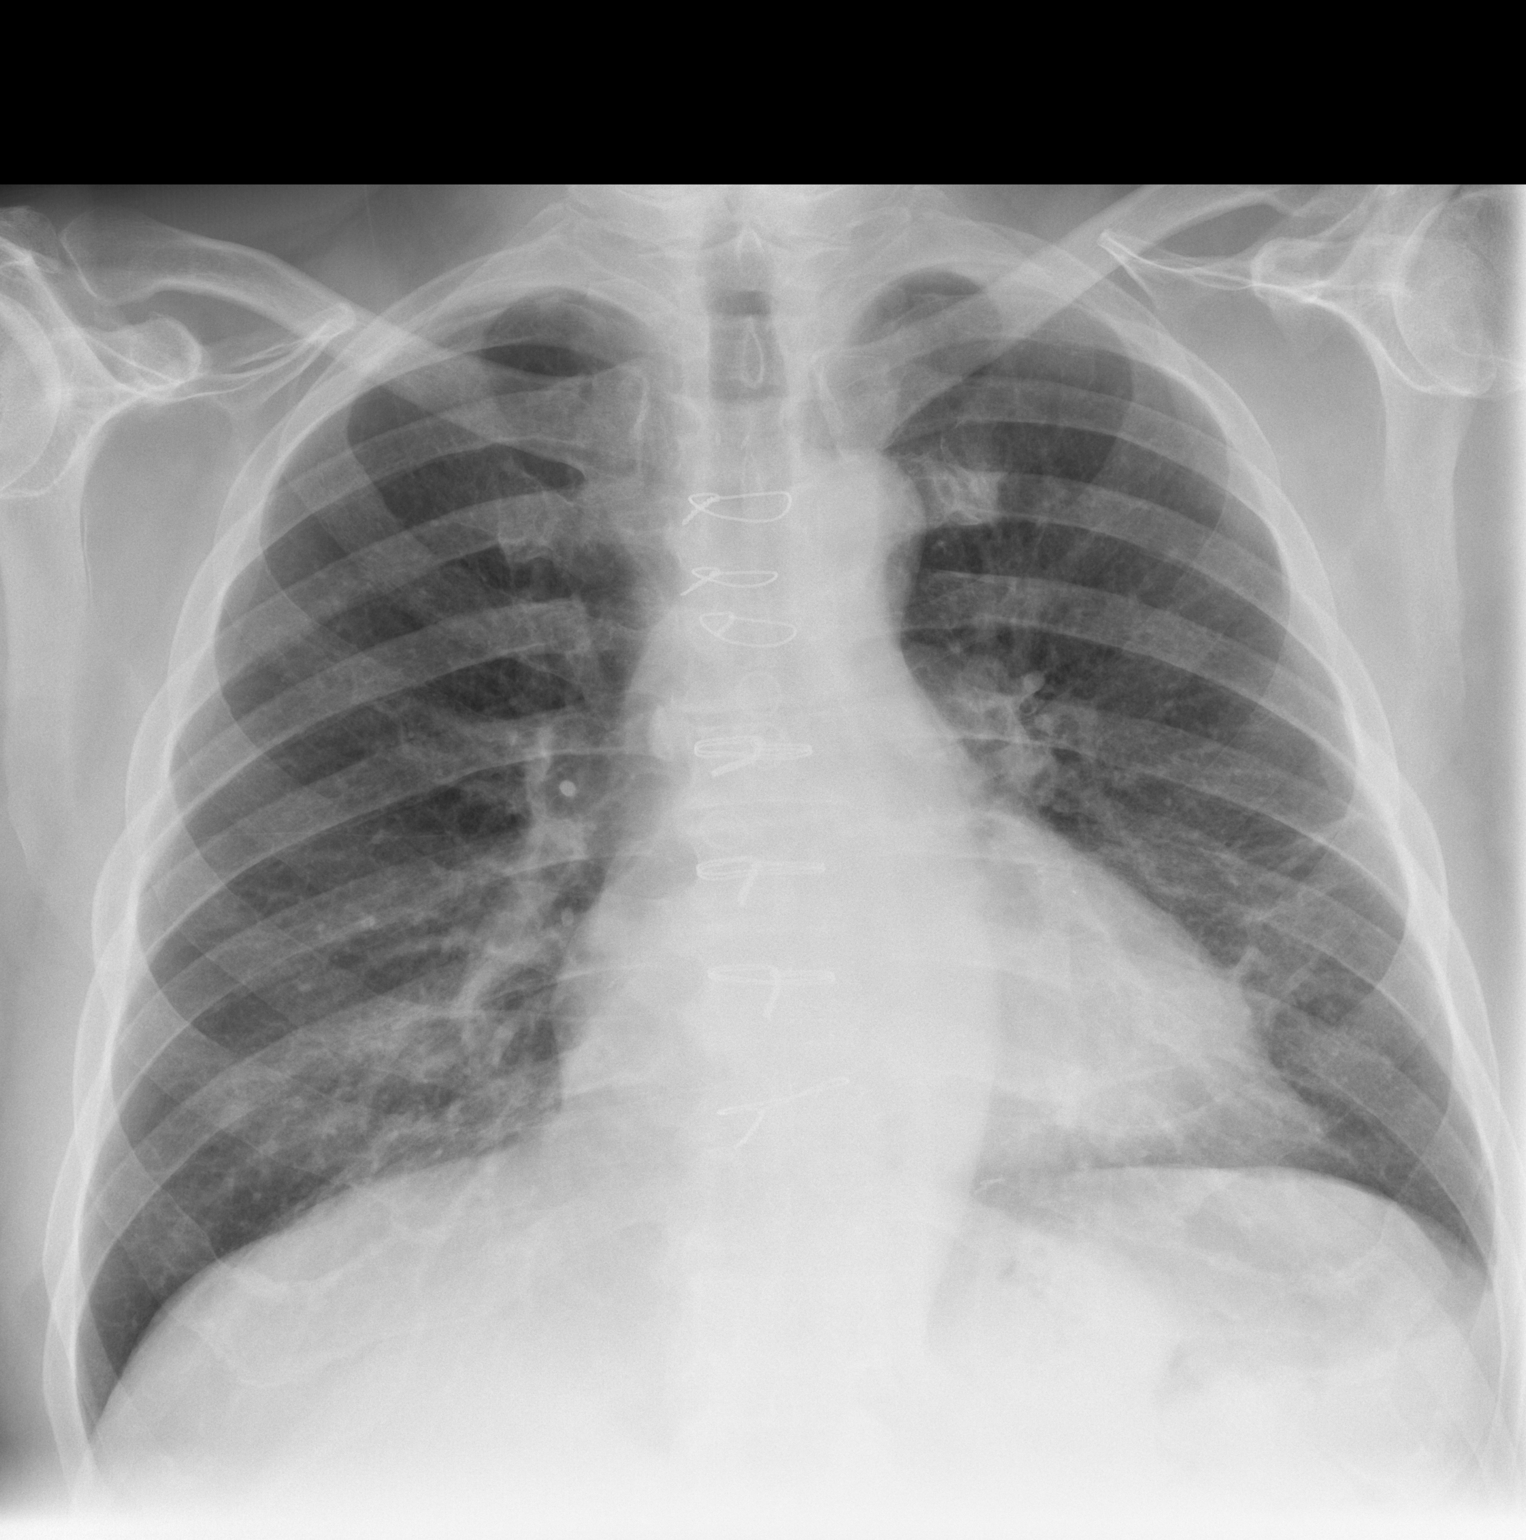

[w chest lat]
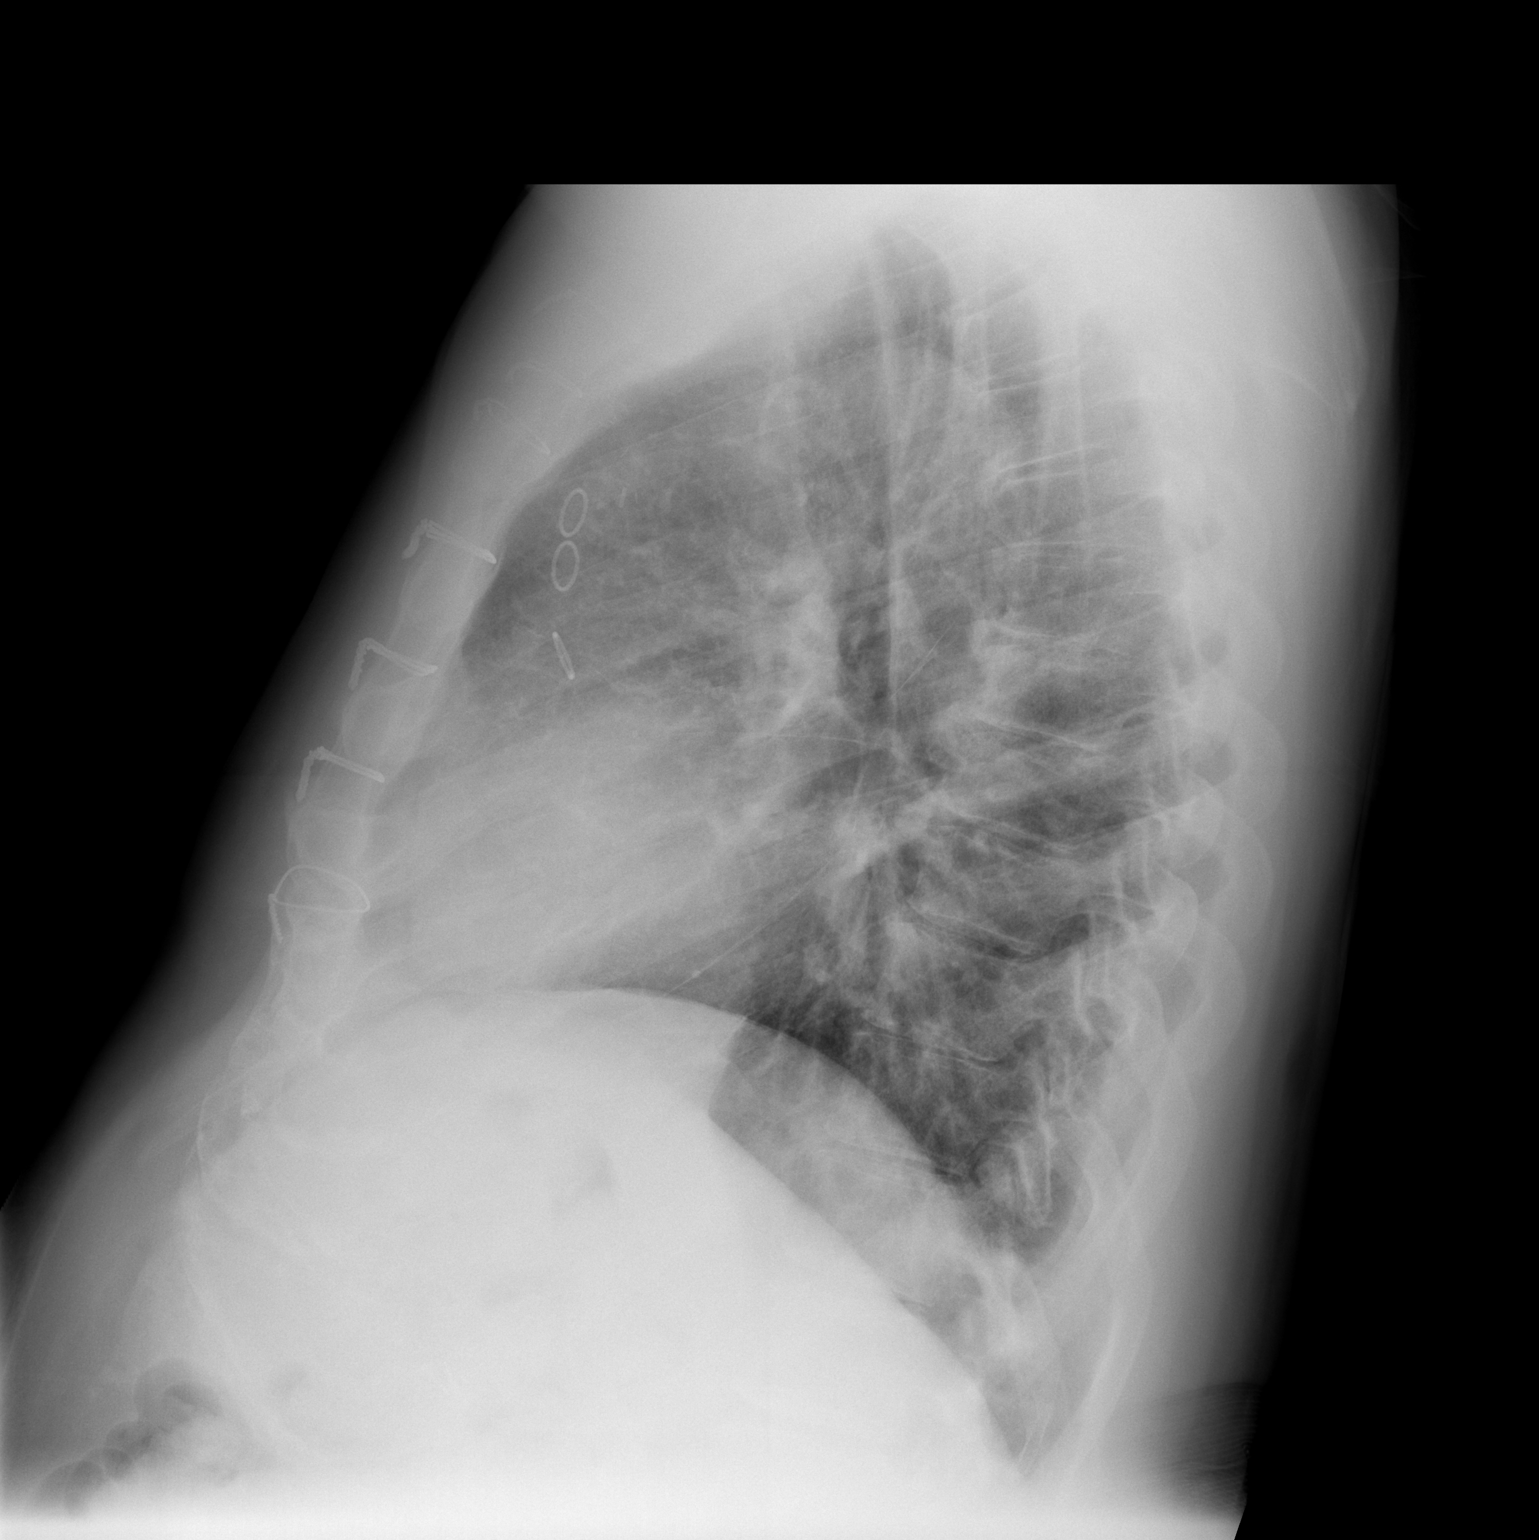

[2 of 2 positions shown; findings below may reference images not displayed]

FINDINGS: Trachea is midline.  Heart size stable.  Sternotomy wires
are unchanged in position.  Minimal scattered linear densities at
the lung bases.  Lungs are otherwise clear.  No pleural fluid.  No
pneumothorax.
IMPRESSION: Minimal scattered atelectasis or scarring at the lung bases.

## 2013-11-15 ENCOUNTER — Encounter: Payer: Self-pay | Admitting: *Deleted

## 2013-11-26 ENCOUNTER — Ambulatory Visit: Payer: Medicare Other | Admitting: Physician Assistant

## 2014-01-16 ENCOUNTER — Telehealth: Payer: Self-pay | Admitting: Cardiovascular Disease

## 2014-01-16 NOTE — Telephone Encounter (Signed)
Spoke with pt.   He is requesting appt with Dr. Clifton JamesMcAlhany. Had 2 episodes of jaw pain recently. One about a month ago and one yesterday. Resolved on own.  Appt made for pt to see Dr. Clifton JamesMcAlhany on January 17, 2014 at 12:15.

## 2014-01-16 NOTE — Telephone Encounter (Signed)
New message          Pt would like for MD to give him a call / he did not disclose any information

## 2014-01-17 ENCOUNTER — Encounter: Payer: Self-pay | Admitting: Cardiovascular Disease

## 2014-01-17 ENCOUNTER — Ambulatory Visit (INDEPENDENT_AMBULATORY_CARE_PROVIDER_SITE_OTHER): Payer: Medicare Other | Admitting: Cardiovascular Disease

## 2014-01-17 VITALS — BP 126/90 | HR 70 | Ht 73.0 in | Wt 242.8 lb

## 2014-01-17 DIAGNOSIS — I10 Essential (primary) hypertension: Secondary | ICD-10-CM

## 2014-01-17 DIAGNOSIS — E785 Hyperlipidemia, unspecified: Secondary | ICD-10-CM

## 2014-01-17 DIAGNOSIS — I5032 Chronic diastolic (congestive) heart failure: Secondary | ICD-10-CM

## 2014-01-17 DIAGNOSIS — I2510 Atherosclerotic heart disease of native coronary artery without angina pectoris: Secondary | ICD-10-CM

## 2014-01-17 NOTE — Progress Notes (Signed)
History of Present Illness: 73 y.o. male with history of CAD s/p 5V CABG March 2014, DM, HTN, HLD, former tobacco abuse who is here today for cardiac follow up. He was admitted to [facility] 3/26-08/31/12 with a NSTEMI. Cardiac cath 08/23/12 demonstrated severe 3 vessel CAD with preserved LV function. Echo 08/24/12: Mild LVH, EF 65-70%, mild RVE, PASP 36. Pre-op dopplers with no carotid stenosis. 5V CABG 08/25/12 (LIMA-LAD, SVG-DX, SVG-OM1/OM2, SVG-PDA). Echo 02/28/13 with normal LV size and function, LVEF=55-60%. Metoprolol was stopped October 2014 due to fatigue. I saw him 06/04/13 and he had c/o right sided chest pain and jaw pain. This occurred after a long weekend of manual labor building a deck. No recurrence since then. I arranged a stress myoview on 06/20/13 which showed no ischemia. He exercised for 7 minutes without chest pain.   He is here today for follow up. Hehas  rare chest pains. Overall energy level is better. Still easily fatigued with the type of heavy exertion that had not been a problem before his bypass.    Primary Care Physician: Juline PatchRichard Pang  Last Lipid Profile:Lipid Panel     Component Value Date/Time   CHOL 107 10/31/2012 0821   TRIG 103.0 10/31/2012 0821   HDL 35.60* 10/31/2012 0821   CHOLHDL 3 10/31/2012 0821   VLDL 20.6 10/31/2012 0821   LDLCALC 51 10/31/2012 0821     Past Medical History  Diagnosis Date  . Diabetes mellitus without complication     a. Dx in 2010  . Hypertension     a. Dx in 2010  . Back pain, chronic   . DJD (degenerative joint disease)   . History of tobacco abuse     a. 57 year hx, quit 03/2012.  [name]. Chronic pain   . Fibromyalgia   . CAD (coronary artery disease)     a. s/p NSTEMI 07/2012 => LHC with 3v CAD => s/p CABG 08/25/12 (LIMA-LAD, SVG-DX, SVG-OM1/OM2, SVG-PDA) Dr. Tyrone SageGerhardt;  b.  Echo 08/24/12:  Mild LVH, EF 65-70%, mild RVE, PASP 36  . Chronic back pain   . HLD (hyperlipidemia)     Past Surgical History  Procedure Laterality Date  . Right ankle  surgery    . Cholecystectomy    . Bilateral rotator cuff repair    . Transurethral resection of prostate    . Vocal cord tumor resection    . Skin cancer of left face - resected Left     parotid gland resection-non cancerous  . Coronary artery bypass graft N/A 08/25/2012    Procedure: CORONARY ARTERY BYPASS GRAFTING (CABG);  Surgeon: Delight OvensEdward B Gerhardt, MD;  [facility];  Service: Open Heart Surgery;  Laterality: N/A;  Coronary artery bypass graft times five using left internal mammary artery and bilateral saphenous leg vein using endoscope.  . Intraoperative transesophageal echocardiogram N/A 08/25/2012    Procedure: INTRAOPERATIVE TRANSESOPHAGEAL ECHOCARDIOGRAM;  Surgeon: Delight OvensEdward B Gerhardt, MD;  [facility] OR;  Service: Open Heart Surgery;  Laterality: N/A;    Current Outpatient Prescriptions  Medication Sig Dispense Refill  . aspirin EC 325 MG EC tablet Take 1 tablet (325 mg total) by mouth daily.  30 tablet    . atorvastatin (LIPITOR) 80 MG tablet TAKE 1 TABLET BY MOUTH EVERY DAY AT 6PM  30 tablet  2  . buprenorphine (BUTRANS - DOSED MCG/HR) 10 MCG/HR PTWK Place 10 mcg onto the skin once a week.      [name]. buPROPion (WELLBUTRIN XL) 300 MG 24 hr  tablet Take 150 mg by mouth daily.       . cholecalciferol (VITAMIN D) 1000 UNITS tablet Take 1,000 Units by mouth daily.      . clonazePAM (KLONOPIN) 1 MG tablet 1 tab nightly      . furosemide (LASIX) 40 MG tablet Take 1 tablet (40 mg total) by mouth 2 (two) times daily.  180 tablet  2  . metFORMIN (GLUCOPHAGE) 500 MG tablet Take 500 mg by mouth daily.      . nitroGLYCERIN (NITROSTAT) 0.4 MG SL tablet Place 1 tablet (0.4 mg total) under the tongue every 5 (five) minutes as needed for chest pain.  25 tablet  6  . pantoprazole (PROTONIX) 40 MG tablet Take 40 mg by mouth 2 (two) times daily.       . potassium chloride SA (K-DUR,KLOR-CON) 20 MEQ tablet TAKE 2 TABLETS BY MOUTH DAILY  60 tablet  2  . Vilazodone HCl (VIIBRYD) 40 MG TABS Take 40 mg by  mouth daily.       No current facility-administered medications for this visit.    No Known Allergies  History   Social History  . Marital Status: Married    Spouse Name: N/A    Number of Children: N/A  . Years of Education: N/A   Occupational History  . Not on file.   Social History Main Topics  . Smoking status: Former Smoker -- 1.00 packs/day for 57 years    Types: Cigarettes  . Smokeless tobacco: Former Neurosurgeon    Types: Chew     Comment: smoked from the age of 49.  Quit 03/2012  . Alcohol Use: No  . Drug Use: No  . Sexual Activity: Not on file   Other Topics Concern  . Not on file   Social History Narrative   Lives in [place] with wife.  Retired.    Family History  Problem Relation Age of Onset  . Heart attack Father     died @ 89  . Lung cancer Mother     died @ 32  . Other Sister     [employer] in her 93's  . Bladder Cancer Brother     alive    Review of Systems:  As stated in the HPI and otherwise negative.   BP 126/90  Pulse 70  Ht 6\' 1"  (1.854 m)  Wt 242 lb 12.8 oz (110.133 kg)  BMI 32.04 kg/m2  Physical Examination: General: Well developed, well nourished, NAD HEENT: OP clear, mucus membranes moist SKIN: warm, dry. No rashes. Neuro: No focal deficits Musculoskeletal: Muscle strength 5/5 all ext Psychiatric: Mood and affect normal Neck: No JVD, no carotid bruits, no thyromegaly, no lymphadenopathy. Lungs:Clear bilaterally, no wheezes, rhonci, crackles Cardiovascular: Regular rate and rhythm. No murmurs, gallops or rubs. Abdomen:Soft. Bowel sounds present. Non-tender.  Extremities: No lower extremity edema. Pulses are 2 + in the bilateral DP/PT.  Exercise stress myoview 06/20/13: Stress Procedure: The patient exercised on the treadmill utilizing the Bruce Protocol for 7:00 minutes. The patient stopped due to fatigue and denied any chest pain. Technetium 20m Sestamibi was injected at peak exercise and myocardial perfusion imaging was performed after a  brief delay.  Stress ECG: No significant change from baseline ECG  QPS  Raw Data Images: Normal; no motion artifact; normal heart/lung ratio.  Stress Images: Normal homogeneous uptake in all areas of the myocardium.  Rest Images: Normal homogeneous uptake in all areas of the myocardium.  Subtraction (SDS): No evidence of ischemia.  Transient Ischemic Dilatation (Normal <1.22): 0.95  Lung/Heart Ratio (Normal <0.45): 0.34  Quantitative Gated Spect Images  QGS EDV: 114 ml  QGS ESV: 52 ml  Impression  Exercise Capacity: Good exercise capacity.  BP Response: Normal blood pressure response.  Clinical Symptoms: No symptoms.  ECG Impression: No significant ST segment change suggestive of ischemia.  Comparison with Prior Nuclear Study: No images to compare  Overall Impression: Normal stress nuclear study.  LV Ejection Fraction: 54%. LV Wall Motion: NL LV Function; NL Wall Motion   Assessment and Plan:   1. CAD: Stable. Stress test 06/20/13 without ischemia. He is on an ASA, statin. He has been off of beta blocker due to fatigue.  Rare chest pains do not seem to be cardiac related.   2. Chronic diastolic CHF: LE edema is stable. Continue Lasix 40 mg po Qdaily.   3. HTN: BP stable. No changes  4. Hyperlipidemia: On statin. No changes

## 2014-01-17 NOTE — Patient Instructions (Addendum)
Your physician wants you to follow-up in:  6 months. You will receive a reminder letter in the mail two months in advance. If you don't receive a letter, please call our office to schedule the follow-up appointment.   

## 2014-01-21 ENCOUNTER — Ambulatory Visit: Payer: Medicare Other | Admitting: Cardiovascular Disease

## 2014-01-31 ENCOUNTER — Other Ambulatory Visit: Payer: Self-pay | Admitting: Cardiovascular Disease

## 2014-04-05 ENCOUNTER — Telehealth: Payer: Self-pay | Admitting: Cardiovascular Disease

## 2014-04-05 NOTE — Telephone Encounter (Signed)
New message    Patient   calling c/o pressure on chest, sometimes dizzy.  Took nitro x 1.

## 2014-04-05 NOTE — Telephone Encounter (Signed)
Call transferred into triage. Pt reports an episode of chest pressure while walking around the [employer] store. Denies sweating, nausea or SOB.  Has "absolutely no energy". Has headaches sometimes, not currently. Also dizzy sometimes, not currently. He is currently out in his yard, will go inside and rest a few minutes and then check his bp. Discussed with Dr. Clifton JamesMcAlhany. Rec pt relax, take it easy today.  If he has chest pain - take nitro, if no relief go to ED. Called patient back.  BP is 140/90.  He auscultated it himself. Gave Dr. Gibson RampMcAlhany's recommendations. Pt verbalizes understanding and agreement with this plan.

## 2014-04-22 ENCOUNTER — Other Ambulatory Visit: Payer: Self-pay | Admitting: Cardiovascular Disease

## 2014-04-23 ENCOUNTER — Encounter (HOSPITAL_COMMUNITY): Payer: Self-pay | Admitting: Emergency Medicine

## 2014-04-23 ENCOUNTER — Emergency Department (HOSPITAL_COMMUNITY): Payer: Medicare Other

## 2014-04-23 ENCOUNTER — Emergency Department (HOSPITAL_COMMUNITY)
Admission: EM | Admit: 2014-04-23 | Discharge: 2014-04-23 | Disposition: A | Discharge status: Home / Self Care | Payer: Medicare Other | Attending: Emergency Medicine | Admitting: Emergency Medicine

## 2014-04-23 DIAGNOSIS — Z7982 Long term (current) use of aspirin: Secondary | ICD-10-CM | POA: Diagnosis not present

## 2014-04-23 DIAGNOSIS — F32A Depression, unspecified: Secondary | ICD-10-CM

## 2014-04-23 DIAGNOSIS — M1990 Unspecified osteoarthritis, unspecified site: Secondary | ICD-10-CM | POA: Diagnosis not present

## 2014-04-23 DIAGNOSIS — Z87891 Personal history of nicotine dependence: Secondary | ICD-10-CM | POA: Diagnosis not present

## 2014-04-23 DIAGNOSIS — E785 Hyperlipidemia, unspecified: Secondary | ICD-10-CM | POA: Diagnosis not present

## 2014-04-23 DIAGNOSIS — Z951 Presence of aortocoronary bypass graft: Secondary | ICD-10-CM

## 2014-04-23 DIAGNOSIS — S3992XA Unspecified injury of lower back, initial encounter: Secondary | ICD-10-CM | POA: Diagnosis present

## 2014-04-23 DIAGNOSIS — I2510 Atherosclerotic heart disease of native coronary artery without angina pectoris: Secondary | ICD-10-CM | POA: Diagnosis not present

## 2014-04-23 DIAGNOSIS — W228XXA Striking against or struck by other objects, initial encounter: Secondary | ICD-10-CM | POA: Insufficient documentation

## 2014-04-23 DIAGNOSIS — Y9389 Activity, other specified: Secondary | ICD-10-CM | POA: Insufficient documentation

## 2014-04-23 DIAGNOSIS — Y9289 Other specified places as the place of occurrence of the external cause: Secondary | ICD-10-CM | POA: Insufficient documentation

## 2014-04-23 DIAGNOSIS — M545 Low back pain, unspecified: Secondary | ICD-10-CM

## 2014-04-23 DIAGNOSIS — M25562 Pain in left knee: Secondary | ICD-10-CM

## 2014-04-23 DIAGNOSIS — I255 Ischemic cardiomyopathy: Secondary | ICD-10-CM

## 2014-04-23 DIAGNOSIS — Z79899 Other long term (current) drug therapy: Secondary | ICD-10-CM | POA: Insufficient documentation

## 2014-04-23 DIAGNOSIS — I214 Non-ST elevation (NSTEMI) myocardial infarction: Secondary | ICD-10-CM

## 2014-04-23 DIAGNOSIS — I10 Essential (primary) hypertension: Secondary | ICD-10-CM | POA: Diagnosis not present

## 2014-04-23 DIAGNOSIS — I200 Unstable angina: Secondary | ICD-10-CM

## 2014-04-23 DIAGNOSIS — M5440 Lumbago with sciatica, unspecified side: Secondary | ICD-10-CM

## 2014-04-23 DIAGNOSIS — M549 Dorsalgia, unspecified: Secondary | ICD-10-CM

## 2014-04-23 DIAGNOSIS — Y998 Other external cause status: Secondary | ICD-10-CM | POA: Insufficient documentation

## 2014-04-23 DIAGNOSIS — G8929 Other chronic pain: Secondary | ICD-10-CM

## 2014-04-23 DIAGNOSIS — E119 Type 2 diabetes mellitus without complications: Secondary | ICD-10-CM

## 2014-04-23 DIAGNOSIS — M797 Fibromyalgia: Secondary | ICD-10-CM

## 2014-04-23 DIAGNOSIS — F329 Major depressive disorder, single episode, unspecified: Secondary | ICD-10-CM

## 2014-04-23 LAB — APTT: aPTT: 34 seconds (ref 24–37)

## 2014-04-23 LAB — PROTIME-INR
INR: 1.1 (ref 0.00–1.49)
Prothrombin Time: 14.3 seconds (ref 11.6–15.2)

## 2014-04-23 LAB — CBC
HEMATOCRIT: 45.3 % (ref 39.0–52.0)
HEMOGLOBIN: 15.2 g/dL (ref 13.0–17.0)
MCH: 30 pg (ref 26.0–34.0)
MCHC: 33.6 g/dL (ref 30.0–36.0)
MCV: 89.5 fL (ref 78.0–100.0)
Platelets: 205 10*3/uL (ref 150–400)
RBC: 5.06 MIL/uL (ref 4.22–5.81)
RDW: 13.5 % (ref 11.5–15.5)
WBC: 5.2 10*3/uL (ref 4.0–10.5)

## 2014-04-23 MED ORDER — LIDOCAINE HCL (PF) 1 % IJ SOLN
INTRAMUSCULAR | Status: AC
  Filled 2014-04-23: qty 5

## 2014-04-23 MED ORDER — SODIUM CHLORIDE 0.9 % IJ SOLN
INTRAMUSCULAR | Status: AC
  Filled 2014-04-23: qty 10

## 2014-04-23 MED ORDER — OXYCODONE-ACETAMINOPHEN 5-325 MG PO TABS
1.0000 | ORAL_TABLET | Freq: Once | ORAL | Status: AC
  Administered 2014-04-23: 1 via ORAL
  Filled 2014-04-23: qty 1

## 2014-04-23 MED ORDER — METHYLPREDNISOLONE ACETATE 40 MG/ML IJ SUSP
INTRAMUSCULAR | Status: AC
  Filled 2014-04-23: qty 5

## 2014-04-23 MED ORDER — METHYLPREDNISOLONE ACETATE 80 MG/ML IJ SUSP
INTRAMUSCULAR | Status: AC
  Filled 2014-04-23: qty 1

## 2014-04-23 NOTE — ED Provider Notes (Signed)
CSN: [id number]     Arrival date & time 04/23/14  4098 History   First MD Initiated Contact with Patient 04/23/14 [phone]     Chief Complaint  Patient presents with  . Knee Injury    The patient was splitting wood and a log rolled bacvk on to his knee.    . Back Pain     (Consider location/radiation/quality/duration/timing/severity/associated sxs/prior Treatment) HPI Pt is a 73yo male with hx of chronic back pain, DJD, chronic pain, and fibromyalgia, presenting to ED with c/o left knee pain that started suddenly on Sunday, 11/22 after pt was hit bit a log while splitting wood.  States the wood rolled back over his knee.  He believed the pain would improve, however, it has not. Pain is constant, aching , throbbing, 10/10.  Pt also c/o worsening left sided lower back pain since incident. He states he has had spinal injections in the past, which have helped with the pain. Denies previous back surgeries. Denies hitting head during fall or any other injuries.  Denies cahnge in bowel or bladder habits. Denies numbness or tingling in legs or groin. States he has oxycodone at home which he has been taking for pain w/o relief.    Past Medical History  Diagnosis Date  . Diabetes mellitus without complication     a. Dx in 2010  . Hypertension     a. Dx in 2010  . Back pain, chronic   . DJD (degenerative joint disease)   . History of tobacco abuse     a. 57 year hx, quit 03/2012.  [name] Chronic pain   . Fibromyalgia   . CAD (coronary artery disease)     a. s/p NSTEMI 07/2012 => LHC with 3v CAD => s/p CABG 08/25/12 (LIMA-LAD, SVG-DX, SVG-OM1/OM2, SVG-PDA) Dr. Tyrone Sage;  b.  Echo 08/24/12:  Mild LVH, EF 65-70%, mild RVE, PASP 36  . Chronic back pain   . HLD (hyperlipidemia)    Past Surgical History  Procedure Laterality Date  . Right ankle surgery    . Cholecystectomy    . Bilateral rotator cuff repair    . Transurethral resection of prostate    . Vocal cord tumor resection    . Skin cancer of left  face - resected Left     parotid gland resection-non cancerous  . Coronary artery bypass graft N/A 08/25/2012    Procedure: CORONARY ARTERY BYPASS GRAFTING (CABG);  Surgeon: Delight Ovens, MD;  Location: [facility] OR;  Service: Open Heart Surgery;  Laterality: N/A;  Coronary artery bypass graft times five using left internal mammary artery and bilateral saphenous leg vein using endoscope.  . Intraoperative transesophageal echocardiogram N/A 08/25/2012    Procedure: INTRAOPERATIVE TRANSESOPHAGEAL ECHOCARDIOGRAM;  Surgeon: Delight Ovens, MD;  Location: [facility] OR;  Service: Open Heart Surgery;  Laterality: N/A;   Family History  Problem Relation Age of Onset  . Heart attack Father     died @ 35  . Lung cancer Mother     died @ 15  . Other Sister     A[employer] in her 74's  . Bladder Cancer Brother     alive   History  Substance Use Topics  . Smoking status: Former Smoker -- 1.00 packs/day for 57 years    Types: Cigarettes  . Smokeless tobacco: Former Neurosurgeon    Types: Chew     Comment: smoked from the age of 76.  Quit 03/2012  . Alcohol Use: No    Review of  Systems  Constitutional: Negative for fever and chills.  Respiratory: Negative for cough and shortness of breath.   Cardiovascular: Negative for chest pain and palpitations.  Musculoskeletal: Positive for myalgias, back pain (left lower back) and arthralgias ( left knee). Negative for joint swelling.  Skin: Negative for color change and wound.  All other systems reviewed and are negative.     Allergies  Review of patient's allergies indicates no known allergies.  Home Medications   Prior to Admission medications   Medication Sig Start Date End Date Taking? Authorizing Provider  aspirin EC 325 MG EC tablet Take 1 tablet (325 mg total) by mouth daily. 08/31/12  Yes Donielle Margaretann LovelessM Zimmerman, PA-C  buprenorphine (BUTRANS - DOSED MCG/HR) 10 MCG/HR PTWK Place 10 mcg onto the skin once a week.   Yes Historical Provider, MD  buPROPion (WELLBUTRIN  SR) 150 MG 12 hr tablet Take 150 mg by mouth daily.   Yes Historical Provider, MD  clonazePAM (KLONOPIN) 1 MG tablet Take 1 mg by mouth at bedtime. 1 tab nightly 10/12/12  Yes Historical Provider, MD  furosemide (LASIX) 40 MG tablet Take 1 tablet (40 mg total) by mouth 2 (two) times daily. Patient taking differently: Take 40 mg by mouth daily.  09/17/13  Yes Kathleene Hazelhristopher D McAlhany, MD  nitroGLYCERIN (NITROSTAT) 0.4 MG SL tablet Place 1 tablet (0.4 mg total) under the tongue every 5 (five) minutes as needed for chest pain. 06/04/13  Yes Kathleene Hazelhristopher D McAlhany, MD  pantoprazole (PROTONIX) 40 MG tablet Take 40 mg by mouth daily.    Yes Historical Provider, MD  potassium chloride SA (K-DUR,KLOR-CON) 20 MEQ tablet Take 40 mEq by mouth 2 (two) times daily.   Yes Historical Provider, MD  traMADol (ULTRAM) 50 MG tablet Take 50 mg by mouth every 12 (twelve) hours as needed for moderate pain.  04/01/14  Yes Historical Provider, MD  Vilazodone HCl (VIIBRYD) 40 MG TABS Take 40 mg by mouth daily.   Yes Historical Provider, MD  atorvastatin (LIPITOR) 80 MG tablet TAKE 1 TABLET BY MOUTH EVERY DAY AT 6PM Patient not taking: Reported on 04/23/2014 09/27/13   Kathleene Hazelhristopher D McAlhany, MD  buPROPion (WELLBUTRIN XL) 300 MG 24 hr tablet Take 150 mg by mouth daily.     Historical Provider, MD  cholecalciferol (VITAMIN D) 1000 UNITS tablet Take 1,000 Units by mouth daily.    Historical Provider, MD  metFORMIN (GLUCOPHAGE) 500 MG tablet Take 500 mg by mouth daily.    Historical Provider, MD  potassium chloride SA (K-DUR,KLOR-CON) 20 MEQ tablet TAKE 2 TABLET BY MOUTH DAILY 01/31/14   Kathleene Hazelhristopher D McAlhany, MD   BP 118/59 mmHg  Pulse 72  Temp(Src) 98.5 F (36.9 C) (Oral)  Resp 16  Ht 6\' 2"  (1.88 m)  Wt 238 lb (107.956 kg)  BMI 30.54 kg/m2  SpO2 98% Physical Exam  Constitutional: He is oriented to person, place, and time. He appears well-developed and well-nourished.  HENT:  Head: Normocephalic and atraumatic.  Eyes: EOM  are normal.  Neck: Normal range of motion. Neck supple.  Cardiovascular: Normal rate.   Pulmonary/Chest: Effort normal.  Abdominal: Soft. He exhibits no distension. There is no tenderness.  Musculoskeletal: Normal range of motion. He exhibits tenderness. He exhibits no edema.  Tenderness to upper lumbar spine and left side of lumbar spine in musculature. No step offs or crepitus. FROM arms and legs. 5/5 strength in UE & LE bilaterally. Left knee: prominent anterior tuberosity (chronic), tenderness over patella and lateral aspect of  left knee. No edema. No joint line tenderness.   Neurological: He is alert and oriented to person, place, and time.  Skin: Skin is warm and dry. No erythema.  Psychiatric: He has a normal mood and affect. His behavior is normal.  Nursing note and vitals reviewed.   ED Course  Procedures (including critical care time) Labs Review Labs Reviewed  CBC  PROTIME-INR  APTT    Imaging Review Dg Knee Complete 4 Views Left  04/23/2014   CLINICAL DATA:  Pain following trauma earlier in the day  EXAM: LEFT KNEE - COMPLETE 4+ VIEW  COMPARISON:  None.  FINDINGS: Frontal, lateral, and bilateral oblique views were obtained. There is no acute fracture or dislocation. No effusion. Bony overgrowth along the anterior proximal tibia may represent residua of old trauma. There is mild narrowing of the patellofemoral joint. There is spurring along the anterior patella superiorly and inferiorly. There is no erosive change. There is postoperative change posteriorly and medially. There are foci of arterial vascular calcification.  IMPRESSION: Areas of osteoarthritic change. Question old trauma proximal tibia with remodeling. No acute fracture or dislocation. No apparent joint effusion.   Electronically Signed   By: Bretta BangWilliam  Woodruff M.D.   On: 04/23/2014 07:39     EKG Interpretation None      MDM   Final diagnoses:  Low back pain  Left knee pain   Pt is a 73yo male with hx of  chronic back pain and fibromyalgia, c/o left knee pain and left sided lower back pain after being hit by a log 2 days ago while cutting wood. Left knee plain film: areas of osteoarthritic change. Question old trauma proximal tibia with remodeling. No acute fracture or dislocation. No apparent joint effusion.   Pt given percocet for pain.  Pt is seen by Dr. Danielle DessElsner, neurosurgery, who has called stating he is going to come see pt while he is in the ED. Requested a plain film of lumbar spine.   9:40 AM: consulted with Dr. Danielle DessElsner, pt is to have a spinal steroid injection. He, requested an order be placed for IR epidurography translaminar steroid injection L4-L5   1:14 PM Consulted with radiology who requested a CT lumbar spine be ordered prior to IR procedure being completed.   Pt signed out to Everlene FarrierWilliam Dansie, PA-C at shift change. Plan is to f/u with IR. Pt will be able to be discharged home after spinal injection. F/u with Dr. Danielle DessElsner as needed.   Junius Finnerrin O'Malley, PA-C 04/23/14 1639  Raeford RazorStephen Kohut, MD 04/24/14 1224

## 2014-04-23 NOTE — ED Provider Notes (Signed)
[facility] at shift change.  Pt is a 73yo male with hx of chronic back pain and fibromyalgia, c/o left knee pain and left sided lower back pain after being hit by a log 2 days ago while cutting wood.  Patient is waiting on IR for lumbar steroid injection. Patient can be discharged after steroid injection. Discharge instructions already provided by Junius FinnerErin O'Malley PA-C prior to shift change. Patient will follow-up with Dr. Danielle DessElsner this week.  The patient reports his pain is much improved after the steroid injection is ready to be discharged. I reemphasized Erin's discharge instructions. I advised him to follow-up with Dr. Danielle DessElsner this week. Advised patient to return to the ED with new or worsening symptoms or new concerns. Patient verbalized understanding and agreement with plan.  Results for orders placed or performed during the hospital encounter of 04/23/14  CBC  Result Value Ref Range   WBC 5.2 4.0 - 10.5 K/uL   RBC 5.06 4.22 - 5.81 MIL/uL   Hemoglobin 15.2 13.0 - 17.0 g/dL   HCT 96.045.3 45.439.0 - 09.852.0 %   MCV 89.5 78.0 - 100.0 fL   MCH 30.0 26.0 - 34.0 pg   MCHC 33.6 30.0 - 36.0 g/dL   RDW 11.913.5 14.711.5 - 82.915.5 %   Platelets 205 150 - 400 K/uL  Protime-INR  Result Value Ref Range   Prothrombin Time 14.3 11.6 - 15.2 seconds   INR 1.10 0.00 - 1.49  APTT  Result Value Ref Range   aPTT 34 24 - 37 seconds   Dg Lumbar Spine Complete  04/23/2014   CLINICAL DATA:  Chronic low back pain worst along the right side radiating to right upper leg.  EXAM: LUMBAR SPINE - COMPLETE 4+ VIEW  COMPARISON:  MRI 04/19/2012  FINDINGS: Very subtle curvature convex to the right. Mild spondylosis throughout the lumbar spine. Vertebral body heights are within normal without compression fracture. There is a subtle grade 1 anterolisthesis of L4 on L5 due to moderate facet arthropathy unchanged. There is mild disc space narrowing at the L3-4, L4-5 and L5-S1 levels.  IMPRESSION: Mild  spondylosis of the lumbar spine with disc disease from the L3-4 level to the L5-S1 level.  Mild grade 1 anterolisthesis of L4 on L5 unchanged likely due to the moderate facet arthropathy.   Electronically Signed   By: Elberta Fortisaniel  Boyle M.D.   On: 04/23/2014 08:17   Ct Lumbar Spine Wo Contrast  04/23/2014   CLINICAL DATA:  Chronic back pain. Left knee pain that began acutely 04/21/2014 after an injury to the knee.  EXAM: CT LUMBAR SPINE WITHOUT CONTRAST  TECHNIQUE: Multidetector CT imaging of the lumbar spine was performed without intravenous contrast administration. Multiplanar CT image reconstructions were also generated.  COMPARISON:  MRI lumbar spine 04/19/2012.  FINDINGS: Vertebral body height is maintained. Grade 1 anterolisthesis L4 on L5 due to facet arthropathy is unchanged. Paraspinous structures demonstrate scattered aortic atherosclerosis without aneurysm.  T9-10: Facet degenerative disease and a small left paracentral protrusion with endplate spur. There is narrowing in the left lateral recess. Foramina appear open.  T10-11: Small right paracentral protrusion with endplate spur. Facet degenerative change noted. There is likely narrowing in the right lateral recess and mild right foraminal narrowing. Left foramen is open.  T11-12: Right worse than left facet arthropathy. This level is otherwise negative.  T12-L1:  Negative.  L1-2: Facet degenerative change and a shallow disc bulge. The central canal and foramina appear open.  L2-3:  There is some ligamentum flavum thickening, facet arthropathy and a shallow disc bulge. Moderate central canal stenosis is identified. Foramina appear open.  L3-4: Shallow disc bulge, facet arthropathy and mild ligamentum flavum thickening are seen. Moderate to moderately severe central canal and bilateral foraminal narrowing is identified. Degenerative change may be progressive since the prior exam.  L4-5: Advanced bilateral facet arthropathy is present. The disc is uncovered  and bulging. The patient is status post posterior decompression but there appears to moderate to moderately severe central canal stenosis. Right worse than left foraminal narrowing is identified.  L5-S1: There is right worse than left facet arthropathy and a shallow disc bulge. The central canal appears open. Minimal left foraminal narrowing is noted. The right foramen is narrowed predominantly due to facet degenerative disease.  IMPRESSION: No acute abnormality.  Multilevel spondylosis as described above. Degenerative change appears somewhat progressive at L3-4. There has been interval posterior decompression at L4-5 but the central canal appears narrowed.   Electronically Signed   By: Drusilla Kannerhomas  Dalessio M.D.   On: 04/23/2014 15:37   Dg Knee Complete 4 Views Left  04/23/2014   CLINICAL DATA:  Pain following trauma earlier in the day  EXAM: LEFT KNEE - COMPLETE 4+ VIEW  COMPARISON:  None.  FINDINGS: Frontal, lateral, and bilateral oblique views were obtained. There is no acute fracture or dislocation. No effusion. Bony overgrowth along the anterior proximal tibia may represent residua of old trauma. There is mild narrowing of the patellofemoral joint. There is spurring along the anterior patella superiorly and inferiorly. There is no erosive change. There is postoperative change posteriorly and medially. There are foci of arterial vascular calcification.  IMPRESSION: Areas of osteoarthritic change. Question old trauma proximal tibia with remodeling. No acute fracture or dislocation. No apparent joint effusion.   Electronically Signed   By: Bretta BangWilliam  Woodruff M.D.   On: 04/23/2014 07:39   Ir Epidurography  04/23/2014   CLINICAL DATA:  Lumbosacral spondylosis without myelopathy  FLUOROSCOPY TIME:  36 seconds.  PROCEDURE: LUMBAR EPIDURAL  The procedure, risks, benefits, and alternatives were explained to the patient. Questions regarding the procedure were encouraged and answered. The patient understands and  consents to the procedure.  LUMBAR EPIDURAL INJECTION: An interlaminar approach was performed on the left at L4-5. The overlying skin was cleansed and anesthetized. A 20 gauge Crawford epidural needle was advanced using loss-of-resistance technique.  DIAGNOSTIC EPIDURAL INJECTION: Injection of Omnipaque 180 shows a good epidural pattern with spread above and below the level of needle placement, primarily on the left. No vascular opacification is seen.  THERAPEUTIC EPIDURAL INJECTION: 120 mg of Depo-Medrol mixed with 4 cc 1% lidocaine were instilled. The procedure was well-tolerated, and the patient was discharged thirty minutes following the injection in good condition.  COMPLICATIONS: None  IMPRESSION: Technically successful epidural injection on the left at L4-5.   Electronically Signed   By: Maryclare BeanArt  Hoss M.D.   On: 04/23/2014 17:14      Lawana ChambersWilliam Duncan Patric Buckhalter, PA-C 04/24/14 0012  Gerhard Munchobert Lockwood, MD 04/24/14 [phone]

## 2014-04-23 NOTE — Procedures (Signed)
L 4/5 epi corticosteroid injection 120 mg depo 4 cc 1% lido No comp

## 2014-04-23 NOTE — ED Notes (Signed)
Spoke to PA and updated patient on current plan of care.  Currently waiting on follow up orders for IR and Dr. Danielle DessElsner.  Pt may need MRI, or additional resources before procedure.

## 2014-04-23 NOTE — ED Notes (Signed)
The patient was splitting wood and a log rolled bacvk on to his knee.  The patient says his knee has been hurting him since Sunday.  He said he thought it would go away but it did not so he decided to come in.

## 2014-04-23 NOTE — ED Notes (Signed)
Timothy Cervantes reported that pt is going to IR per Dr. Danielle DessElsner. Timothy FinnerErin Cervantes requested that pt. 's acuity level be changed.

## 2014-04-23 NOTE — ED Notes (Signed)
Pants, jacket and cane moved to C24 for patient to find when back from CT.

## 2014-04-23 NOTE — Consult Note (Signed)
Reason for Consult: Back pain and left knee pain. Referring Physician: Junius FinnerErin O'Malley  [name] is an 73 y.o. male.  HPI: Patient is a 73 year old individual who has had previous back problems and is well-known to me. He has had a number of transforaminal epidural steroid injections in the past which seemed to control back and lower extremity pain well. He recently had a injury or he struck his left knee while loading some wood. He subsequently noticed the onset of significant back pain. He's had x-rays here in the emergency room including some lumbar spine films which demonstrate that he has spondylosis in his lower lumbar spine at L4-5 which was a known factor few years ago. His knee x-ray demonstrates that he is some localized knee arthritis. Been asked to see the patient to make a plan for treatment.  Past Medical History  Diagnosis Date  . Diabetes mellitus without complication     a. Dx in 2010  . Hypertension     a. Dx in 2010  . Back pain, chronic   . DJD (degenerative joint disease)   . History of tobacco abuse     a. 57 year hx, quit 03/2012.  [name]. Chronic pain   . Fibromyalgia   . CAD (coronary artery disease)     a. s/p NSTEMI 07/2012 => LHC with 3v CAD => s/p CABG 08/25/12 (LIMA-LAD, SVG-DX, SVG-OM1/OM2, SVG-PDA) Dr. Tyrone SageGerhardt;  b.  Echo 08/24/12:  Mild LVH, EF 65-70%, mild RVE, PASP 36  . Chronic back pain   . HLD (hyperlipidemia)     Past Surgical History  Procedure Laterality Date  . Right ankle surgery    . Cholecystectomy    . Bilateral rotator cuff repair    . Transurethral resection of prostate    . Vocal cord tumor resection    . Skin cancer of left face - resected Left     parotid gland resection-non cancerous  . Coronary artery bypass graft N/A 08/25/2012    Procedure: CORONARY ARTERY BYPASS GRAFTING (CABG);  Surgeon: Delight OvensEdward B Gerhardt, MD;  [facility];  Service: Open Heart Surgery;  Laterality: N/A;  Coronary artery bypass graft times five using left  internal mammary artery and bilateral saphenous leg vein using endoscope.  . Intraoperative transesophageal echocardiogram N/A 08/25/2012    Procedure: INTRAOPERATIVE TRANSESOPHAGEAL ECHOCARDIOGRAM;  Surgeon: Delight OvensEdward B Gerhardt, MD;  [facility];  Service: Open Heart Surgery;  Laterality: N/A;    Family History  Problem Relation Age of Onset  . Heart attack Father     died @ 3559  . Lung cancer Mother     died @ 6285  . Other Sister     A&W in her 1470's  . Bladder Cancer Brother     alive    Social History:  reports that he has quit smoking. His smoking use included Cigarettes. He has a 57 pack-year smoking history. He has quit using smokeless tobacco. His smokeless tobacco use included Chew. He reports that he does not drink alcohol or use illicit drugs.  Allergies: No Known Allergies  Medications: I have reviewed the patient's current medications.  No results found for this or any previous visit (from the past 48 hour(s)).  Dg Lumbar Spine Complete  04/23/2014   CLINICAL DATA:  Chronic low back pain worst along the right side radiating to right upper leg.  EXAM: LUMBAR SPINE - COMPLETE 4+ VIEW  COMPARISON:  MRI 04/19/2012  FINDINGS: Very subtle curvature convex to the  right. Mild spondylosis throughout the lumbar spine. Vertebral body heights are within normal without compression fracture. There is a subtle grade 1 anterolisthesis of L4 on L5 due to moderate facet arthropathy unchanged. There is mild disc space narrowing at the L3-4, L4-5 and L5-S1 levels.  IMPRESSION: Mild spondylosis of the lumbar spine with disc disease from the L3-4 level to the L5-S1 level.  Mild grade 1 anterolisthesis of L4 on L5 unchanged likely due to the moderate facet arthropathy.   Electronically Signed   By: Elberta Fortisaniel  Boyle M.D.   On: 04/23/2014 08:17   Dg Knee Complete 4 Views Left  04/23/2014   CLINICAL DATA:  Pain following trauma earlier in the day  EXAM: LEFT KNEE - COMPLETE 4+ VIEW  COMPARISON:  None.   FINDINGS: Frontal, lateral, and bilateral oblique views were obtained. There is no acute fracture or dislocation. No effusion. Bony overgrowth along the anterior proximal tibia may represent residua of old trauma. There is mild narrowing of the patellofemoral joint. There is spurring along the anterior patella superiorly and inferiorly. There is no erosive change. There is postoperative change posteriorly and medially. There are foci of arterial vascular calcification.  IMPRESSION: Areas of osteoarthritic change. Question old trauma proximal tibia with remodeling. No acute fracture or dislocation. No apparent joint effusion.   Electronically Signed   By: Bretta BangWilliam  Woodruff M.D.   On: 04/23/2014 07:39    Review of Systems  HENT: Negative.   Eyes: Negative.   Respiratory: Negative.   Cardiovascular: Negative.   Musculoskeletal: Positive for back pain.  Skin: Negative.   Neurological: Positive for weakness.       Left lower extremity pain and numbness.  Endo/Heme/Allergies: Negative.   Psychiatric/Behavioral: Negative.    Blood pressure 114/71, pulse 62, temperature 98.5 F (36.9 C), temperature source Oral, resp. rate 20, height 6\' 2"  (1.88 m), weight 107.956 kg (238 lb), SpO2 97 %. Physical Exam  Constitutional: He appears well-developed and well-nourished.  HENT:  Head: Normocephalic and atraumatic.  Musculoskeletal:  Back pain with moderate tenderness in mid lower lumbar spine. Straight leg raising negative to 60. Patrick's maneuver is negative. No draw sign is present on testing knee function. Motor function appears to be intact in iliopsoas quadricep tibialis anterior and gastrocs in both lower extremities.  Neurological:  Cranial nerve examination is within the limits of normal. Motor function lower extremities is intact in iliopsoas quad tibialis anterior and gastrocs. Deep tender reflexes are trace in the patellae and absent in the Achilles bilaterally.  Skin: Skin is warm and dry.   Psychiatric: He has a normal mood and affect. His behavior is normal. Judgment and thought content normal.    Assessment/Plan: Exacerbation of back pain status post remote injury. Plan translaminar epidural steroid injection if this can be done today. Patient may then be discharged.  Bohdan Macho J 04/23/2014, 1:07 PM

## 2014-04-23 NOTE — ED Notes (Signed)
Patient transported to IR 

## 2014-04-24 NOTE — Telephone Encounter (Signed)
atorvastatin (LIPITOR) 80 MG tablet TAKE 1 TABLET BY MOUTH EVERY DAY AT 6PM   Your physician wants you to follow-up in: 6 months. You will receive a reminder letter in the mail two months in advance. If you don't receive a letter, please call our office to schedule the follow-up appointment.   Kathleene Hazelhristopher D McAlhany, MD at 01/17/2014 11:09 AM

## 2014-05-03 ENCOUNTER — Encounter: Payer: Self-pay | Admitting: Cardiovascular Disease

## 2014-05-09 ENCOUNTER — Other Ambulatory Visit: Payer: Self-pay | Admitting: Neurosurgery

## 2014-05-09 ENCOUNTER — Encounter (HOSPITAL_COMMUNITY): Payer: Self-pay | Admitting: Cardiovascular Disease

## 2014-05-09 DIAGNOSIS — M549 Dorsalgia, unspecified: Secondary | ICD-10-CM

## 2014-05-13 ENCOUNTER — Ambulatory Visit
Admission: RE | Admit: 2014-05-13 | Discharge: 2014-05-13 | Disposition: A | Discharge status: Home / Self Care | Payer: Medicare Other | Source: Ambulatory Visit | Attending: Neurosurgery | Admitting: Neurosurgery

## 2014-05-13 DIAGNOSIS — M549 Dorsalgia, unspecified: Secondary | ICD-10-CM

## 2014-05-13 MED ORDER — GADOBENATE DIMEGLUMINE 529 MG/ML IV SOLN
20.0000 mL | Freq: Once | INTRAVENOUS | Status: AC | PRN
  Administered 2014-05-13: 20 mL via INTRAVENOUS

## 2014-05-14 ENCOUNTER — Other Ambulatory Visit: Payer: Medicare Other

## 2014-05-16 ENCOUNTER — Other Ambulatory Visit: Payer: Medicare Other

## 2014-05-22 ENCOUNTER — Other Ambulatory Visit (HOSPITAL_COMMUNITY): Payer: Medicare Other | Admitting: Neurological Surgery

## 2014-05-23 ENCOUNTER — Encounter (HOSPITAL_COMMUNITY): Payer: Self-pay | Admitting: *Deleted

## 2014-05-23 NOTE — Progress Notes (Signed)
Cardiologist is Dr Clifton JamesMcAlhany.  Patient reports that the only chest pain he has is on the outside where his incision from CABG is.

## 2014-05-27 ENCOUNTER — Inpatient Hospital Stay (HOSPITAL_COMMUNITY): Payer: Medicare Other | Admitting: Anesthesiology

## 2014-05-27 ENCOUNTER — Inpatient Hospital Stay (HOSPITAL_COMMUNITY)
Admission: RE | Admit: 2014-05-27 | Discharge: 2014-05-30 | DRG: 460 | Disposition: A | Discharge status: Home / Self Care | Payer: Medicare Other | Source: Ambulatory Visit | Attending: Neurological Surgery | Admitting: Neurological Surgery

## 2014-05-27 ENCOUNTER — Inpatient Hospital Stay (HOSPITAL_COMMUNITY): Payer: Medicare Other

## 2014-05-27 ENCOUNTER — Encounter (HOSPITAL_COMMUNITY): Payer: Self-pay | Admitting: *Deleted

## 2014-05-27 ENCOUNTER — Encounter (HOSPITAL_COMMUNITY)
Admission: RE | Disposition: A | Discharge status: Home / Self Care | Payer: Medicare Other | Source: Ambulatory Visit | Attending: Neurological Surgery

## 2014-05-27 DIAGNOSIS — M5416 Radiculopathy, lumbar region: Secondary | ICD-10-CM | POA: Diagnosis present

## 2014-05-27 DIAGNOSIS — I2510 Atherosclerotic heart disease of native coronary artery without angina pectoris: Secondary | ICD-10-CM | POA: Diagnosis present

## 2014-05-27 DIAGNOSIS — I10 Essential (primary) hypertension: Secondary | ICD-10-CM | POA: Diagnosis present

## 2014-05-27 DIAGNOSIS — Z7982 Long term (current) use of aspirin: Secondary | ICD-10-CM

## 2014-05-27 DIAGNOSIS — M4806 Spinal stenosis, lumbar region: Secondary | ICD-10-CM | POA: Diagnosis present

## 2014-05-27 DIAGNOSIS — E785 Hyperlipidemia, unspecified: Secondary | ICD-10-CM | POA: Diagnosis present

## 2014-05-27 DIAGNOSIS — Z8249 Family history of ischemic heart disease and other diseases of the circulatory system: Secondary | ICD-10-CM | POA: Diagnosis not present

## 2014-05-27 DIAGNOSIS — F17220 Nicotine dependence, chewing tobacco, uncomplicated: Secondary | ICD-10-CM | POA: Diagnosis present

## 2014-05-27 DIAGNOSIS — M4316 Spondylolisthesis, lumbar region: Secondary | ICD-10-CM | POA: Diagnosis present

## 2014-05-27 DIAGNOSIS — M797 Fibromyalgia: Secondary | ICD-10-CM | POA: Diagnosis present

## 2014-05-27 DIAGNOSIS — G4730 Sleep apnea, unspecified: Secondary | ICD-10-CM | POA: Diagnosis present

## 2014-05-27 DIAGNOSIS — Z951 Presence of aortocoronary bypass graft: Secondary | ICD-10-CM

## 2014-05-27 DIAGNOSIS — Z79899 Other long term (current) drug therapy: Secondary | ICD-10-CM

## 2014-05-27 DIAGNOSIS — K219 Gastro-esophageal reflux disease without esophagitis: Secondary | ICD-10-CM | POA: Diagnosis present

## 2014-05-27 DIAGNOSIS — M4310 Spondylolisthesis, site unspecified: Secondary | ICD-10-CM

## 2014-05-27 DIAGNOSIS — Z419 Encounter for procedure for purposes other than remedying health state, unspecified: Secondary | ICD-10-CM

## 2014-05-27 DIAGNOSIS — Z79891 Long term (current) use of opiate analgesic: Secondary | ICD-10-CM | POA: Diagnosis not present

## 2014-05-27 DIAGNOSIS — I252 Old myocardial infarction: Secondary | ICD-10-CM | POA: Diagnosis not present

## 2014-05-27 DIAGNOSIS — M1990 Unspecified osteoarthritis, unspecified site: Secondary | ICD-10-CM | POA: Diagnosis present

## 2014-05-27 DIAGNOSIS — E119 Type 2 diabetes mellitus without complications: Secondary | ICD-10-CM | POA: Diagnosis present

## 2014-05-27 DIAGNOSIS — M549 Dorsalgia, unspecified: Secondary | ICD-10-CM | POA: Diagnosis present

## 2014-05-27 HISTORY — DX: Reserved for inherently not codable concepts without codable children: IMO0001

## 2014-05-27 HISTORY — DX: Adverse effect of unspecified anesthetic, initial encounter: T41.45XA

## 2014-05-27 HISTORY — DX: Other complications of anesthesia, initial encounter: T88.59XA

## 2014-05-27 HISTORY — DX: Gastro-esophageal reflux disease without esophagitis: K21.9

## 2014-05-27 HISTORY — DX: Sleep apnea, unspecified: G47.30

## 2014-05-27 LAB — BASIC METABOLIC PANEL
Anion gap: 6 (ref 5–15)
BUN: 12 mg/dL (ref 6–23)
CALCIUM: 9 mg/dL (ref 8.4–10.5)
CO2: 26 mmol/L (ref 19–32)
CREATININE: 0.93 mg/dL (ref 0.50–1.35)
Chloride: 108 mEq/L (ref 96–112)
GFR calc non Af Amer: 83 mL/min — ABNORMAL LOW (ref >90)
GLUCOSE: 158 mg/dL — AB (ref 70–99)
Potassium: 4.2 mmol/L (ref 3.5–5.1)
Sodium: 140 mmol/L (ref 135–145)

## 2014-05-27 LAB — CBC
HCT: 42.1 % (ref 39.0–52.0)
Hemoglobin: 13.9 g/dL (ref 13.0–17.0)
MCH: 29.8 pg (ref 26.0–34.0)
MCHC: 33 g/dL (ref 30.0–36.0)
MCV: 90.1 fL (ref 78.0–100.0)
PLATELETS: 223 10*3/uL (ref 150–400)
RBC: 4.67 MIL/uL (ref 4.22–5.81)
RDW: 13.9 % (ref 11.5–15.5)
WBC: 4.2 10*3/uL (ref 4.0–10.5)

## 2014-05-27 LAB — SURGICAL PCR SCREEN
MRSA, PCR: NEGATIVE
Staphylococcus aureus: NEGATIVE

## 2014-05-27 LAB — GLUCOSE, CAPILLARY
GLUCOSE-CAPILLARY: 127 mg/dL — AB (ref 70–99)
GLUCOSE-CAPILLARY: 201 mg/dL — AB (ref 70–99)
Glucose-Capillary: 136 mg/dL — ABNORMAL HIGH (ref 70–99)
Glucose-Capillary: 179 mg/dL — ABNORMAL HIGH (ref 70–99)

## 2014-05-27 LAB — TYPE AND SCREEN
ABO/RH(D): A POS
Antibody Screen: NEGATIVE

## 2014-05-27 SURGERY — POSTERIOR LUMBAR FUSION 1 LEVEL
Anesthesia: General | Site: Spine Lumbar

## 2014-05-27 MED ORDER — SODIUM CHLORIDE 0.9 % IV SOLN: INTRAVENOUS | Status: DC

## 2014-05-27 MED ORDER — MUPIROCIN 2 % EX OINT
TOPICAL_OINTMENT | CUTANEOUS | Status: AC
  Filled 2014-05-27: qty 22

## 2014-05-27 MED ORDER — ONDANSETRON HCL 4 MG/2ML IJ SOLN: 4.0000 mg | INTRAMUSCULAR | Status: DC | PRN

## 2014-05-27 MED ORDER — NEOSTIGMINE METHYLSULFATE 10 MG/10ML IV SOLN
INTRAVENOUS | Status: AC
  Filled 2014-05-27: qty 1

## 2014-05-27 MED ORDER — METHOCARBAMOL 1000 MG/10ML IJ SOLN
500.0000 mg | Freq: Four times a day (QID) | INTRAVENOUS | Status: DC | PRN
  Filled 2014-05-27: qty 5

## 2014-05-27 MED ORDER — SODIUM CHLORIDE 0.9 % IJ SOLN
3.0000 mL | Freq: Two times a day (BID) | INTRAMUSCULAR | Status: DC
  Administered 2014-05-27 – 2014-05-30 (×5): 3 mL via INTRAVENOUS

## 2014-05-27 MED ORDER — SENNA 8.6 MG PO TABS
1.0000 | ORAL_TABLET | Freq: Two times a day (BID) | ORAL | Status: DC
  Administered 2014-05-27 – 2014-05-30 (×6): 8.6 mg via ORAL
  Filled 2014-05-27 (×6): qty 1

## 2014-05-27 MED ORDER — SUFENTANIL CITRATE 50 MCG/ML IV SOLN
INTRAVENOUS | Status: AC
  Filled 2014-05-27: qty 1

## 2014-05-27 MED ORDER — FUROSEMIDE 40 MG PO TABS
40.0000 mg | ORAL_TABLET | Freq: Every day | ORAL | Status: DC
  Administered 2014-05-27 – 2014-05-30 (×4): 40 mg via ORAL
  Filled 2014-05-27 (×4): qty 1

## 2014-05-27 MED ORDER — ACETAMINOPHEN 325 MG PO TABS: 650.0000 mg | ORAL_TABLET | ORAL | Status: DC | PRN

## 2014-05-27 MED ORDER — METHOCARBAMOL 500 MG PO TABS
ORAL_TABLET | ORAL | Status: AC
  Filled 2014-05-27: qty 1

## 2014-05-27 MED ORDER — 0.9 % SODIUM CHLORIDE (POUR BTL) OPTIME
TOPICAL | Status: DC | PRN
  Administered 2014-05-27: 1000 mL

## 2014-05-27 MED ORDER — POTASSIUM CHLORIDE CRYS ER 20 MEQ PO TBCR
20.0000 meq | EXTENDED_RELEASE_TABLET | Freq: Two times a day (BID) | ORAL | Status: DC
  Administered 2014-05-27 – 2014-05-30 (×6): 20 meq via ORAL
  Filled 2014-05-27 (×6): qty 1

## 2014-05-27 MED ORDER — CEFAZOLIN SODIUM-DEXTROSE 2-3 GM-% IV SOLR
2.0000 g | INTRAVENOUS | Status: AC
  Administered 2014-05-27: 2 g via INTRAVENOUS
  Filled 2014-05-27: qty 50

## 2014-05-27 MED ORDER — DEXAMETHASONE 2 MG PO TABS
2.0000 mg | ORAL_TABLET | Freq: Two times a day (BID) | ORAL | Status: DC
  Administered 2014-05-27 – 2014-05-29 (×4): 2 mg via ORAL
  Filled 2014-05-27 (×4): qty 1

## 2014-05-27 MED ORDER — BISACODYL 10 MG RE SUPP: 10.0000 mg | Freq: Every day | RECTAL | Status: DC | PRN

## 2014-05-27 MED ORDER — CEFAZOLIN SODIUM 1-5 GM-% IV SOLN
1.0000 g | Freq: Three times a day (TID) | INTRAVENOUS | Status: AC
  Administered 2014-05-28 (×2): 1 g via INTRAVENOUS
  Filled 2014-05-27 (×3): qty 50

## 2014-05-27 MED ORDER — ACETAMINOPHEN 650 MG RE SUPP: 650.0000 mg | RECTAL | Status: DC | PRN

## 2014-05-27 MED ORDER — MENTHOL 3 MG MT LOZG: 1.0000 | LOZENGE | OROMUCOSAL | Status: DC | PRN

## 2014-05-27 MED ORDER — CLONAZEPAM 1 MG PO TABS
1.0000 mg | ORAL_TABLET | Freq: Every day | ORAL | Status: DC
  Administered 2014-05-27 – 2014-05-29 (×3): 1 mg via ORAL
  Filled 2014-05-27 (×3): qty 1

## 2014-05-27 MED ORDER — DEXAMETHASONE SODIUM PHOSPHATE 10 MG/ML IJ SOLN
INTRAMUSCULAR | Status: DC | PRN
  Administered 2014-05-27: 10 mg via INTRAVENOUS

## 2014-05-27 MED ORDER — PROPOFOL 10 MG/ML IV BOLUS
INTRAVENOUS | Status: AC
  Filled 2014-05-27: qty 20

## 2014-05-27 MED ORDER — VITAMIN D 1000 UNITS PO TABS
1000.0000 [IU] | ORAL_TABLET | Freq: Every day | ORAL | Status: DC
  Administered 2014-05-27 – 2014-05-30 (×4): 1000 [IU] via ORAL
  Filled 2014-05-27 (×4): qty 1

## 2014-05-27 MED ORDER — DEXAMETHASONE SODIUM PHOSPHATE 4 MG/ML IJ SOLN
INTRAMUSCULAR | Status: AC
  Filled 2014-05-27: qty 3

## 2014-05-27 MED ORDER — OXYCODONE HCL 5 MG PO TABS
5.0000 mg | ORAL_TABLET | ORAL | Status: DC | PRN
  Administered 2014-05-28 – 2014-05-30 (×10): 5 mg via ORAL
  Filled 2014-05-27 (×10): qty 1

## 2014-05-27 MED ORDER — MIDAZOLAM HCL 5 MG/5ML IJ SOLN
INTRAMUSCULAR | Status: DC | PRN
  Administered 2014-05-27: 1 mg via INTRAVENOUS

## 2014-05-27 MED ORDER — LIDOCAINE HCL (CARDIAC) 20 MG/ML IV SOLN
INTRAVENOUS | Status: AC
  Filled 2014-05-27: qty 5

## 2014-05-27 MED ORDER — MUPIROCIN 2 % EX OINT
TOPICAL_OINTMENT | Freq: Once | CUTANEOUS | Status: AC
  Administered 2014-05-27: 1 via NASAL
  Filled 2014-05-27: qty 22

## 2014-05-27 MED ORDER — KETOROLAC TROMETHAMINE 15 MG/ML IJ SOLN
15.0000 mg | Freq: Four times a day (QID) | INTRAMUSCULAR | Status: AC
  Administered 2014-05-27 – 2014-05-28 (×5): 15 mg via INTRAVENOUS
  Filled 2014-05-27 (×5): qty 1

## 2014-05-27 MED ORDER — ROCURONIUM BROMIDE 50 MG/5ML IV SOLN
INTRAVENOUS | Status: AC
  Filled 2014-05-27: qty 2

## 2014-05-27 MED ORDER — OXYCODONE HCL 5 MG PO CAPS: 5.0000 mg | ORAL_CAPSULE | ORAL | Status: DC | PRN

## 2014-05-27 MED ORDER — BUPIVACAINE HCL (PF) 0.5 % IJ SOLN
INTRAMUSCULAR | Status: DC | PRN
  Administered 2014-05-27: 5 mL
  Administered 2014-05-27: 20 mL

## 2014-05-27 MED ORDER — HYDROMORPHONE HCL 1 MG/ML IJ SOLN
0.2500 mg | INTRAMUSCULAR | Status: DC | PRN
  Administered 2014-05-27 (×4): 0.5 mg via INTRAVENOUS

## 2014-05-27 MED ORDER — LIDOCAINE HCL (CARDIAC) 20 MG/ML IV SOLN
INTRAVENOUS | Status: DC | PRN
  Administered 2014-05-27: 60 mg via INTRAVENOUS

## 2014-05-27 MED ORDER — GLYCOPYRROLATE 0.2 MG/ML IJ SOLN
INTRAMUSCULAR | Status: DC | PRN
  Administered 2014-05-27: .4 mg via INTRAVENOUS

## 2014-05-27 MED ORDER — BUPROPION HCL ER (SR) 150 MG PO TB12
150.0000 mg | ORAL_TABLET | Freq: Every day | ORAL | Status: DC
  Administered 2014-05-28 – 2014-05-30 (×3): 150 mg via ORAL
  Filled 2014-05-27 (×3): qty 1

## 2014-05-27 MED ORDER — PHENYLEPHRINE HCL 10 MG/ML IJ SOLN
10.0000 mg | INTRAMUSCULAR | Status: DC | PRN
  Administered 2014-05-27: 10 ug/min via INTRAVENOUS

## 2014-05-27 MED ORDER — PHENOL 1.4 % MT LIQD: 1.0000 | OROMUCOSAL | Status: DC | PRN

## 2014-05-27 MED ORDER — SODIUM CHLORIDE 0.9 % IJ SOLN
INTRAMUSCULAR | Status: AC
  Filled 2014-05-27: qty 10

## 2014-05-27 MED ORDER — LACTATED RINGERS IV SOLN
INTRAVENOUS | Status: DC | PRN
  Administered 2014-05-27 (×3): via INTRAVENOUS

## 2014-05-27 MED ORDER — MIDAZOLAM HCL 2 MG/2ML IJ SOLN
INTRAMUSCULAR | Status: AC
  Filled 2014-05-27: qty 2

## 2014-05-27 MED ORDER — VILAZODONE HCL 40 MG PO TABS: 40.0000 mg | ORAL_TABLET | Freq: Every day | ORAL | Status: DC

## 2014-05-27 MED ORDER — DOCUSATE SODIUM 100 MG PO CAPS
100.0000 mg | ORAL_CAPSULE | Freq: Two times a day (BID) | ORAL | Status: DC
  Administered 2014-05-27 – 2014-05-30 (×6): 100 mg via ORAL
  Filled 2014-05-27 (×6): qty 1

## 2014-05-27 MED ORDER — INSULIN ASPART 100 UNIT/ML ~~LOC~~ SOLN
0.0000 [IU] | Freq: Every day | SUBCUTANEOUS | Status: DC
  Administered 2014-05-28: 2 [IU] via SUBCUTANEOUS

## 2014-05-27 MED ORDER — SUFENTANIL CITRATE 50 MCG/ML IV SOLN
INTRAVENOUS | Status: DC | PRN
  Administered 2014-05-27: 20 ug via INTRAVENOUS
  Administered 2014-05-27 (×2): 10 ug via INTRAVENOUS
  Administered 2014-05-27: 5 ug via INTRAVENOUS
  Administered 2014-05-27: 10 ug via INTRAVENOUS

## 2014-05-27 MED ORDER — PANTOPRAZOLE SODIUM 40 MG PO TBEC
40.0000 mg | DELAYED_RELEASE_TABLET | Freq: Every day | ORAL | Status: DC
  Administered 2014-05-27 – 2014-05-30 (×4): 40 mg via ORAL
  Filled 2014-05-27 (×4): qty 1

## 2014-05-27 MED ORDER — ALUM & MAG HYDROXIDE-SIMETH 200-200-20 MG/5ML PO SUSP: 30.0000 mL | Freq: Four times a day (QID) | ORAL | Status: DC | PRN

## 2014-05-27 MED ORDER — LACTATED RINGERS IV SOLN
INTRAVENOUS | Status: DC
  Administered 2014-05-27: 10:00:00 via INTRAVENOUS

## 2014-05-27 MED ORDER — LIDOCAINE-EPINEPHRINE 1 %-1:100000 IJ SOLN
INTRAMUSCULAR | Status: DC | PRN
  Administered 2014-05-27: 5 mL

## 2014-05-27 MED ORDER — ROCURONIUM BROMIDE 100 MG/10ML IV SOLN
INTRAVENOUS | Status: DC | PRN
  Administered 2014-05-27 (×3): 10 mg via INTRAVENOUS
  Administered 2014-05-27: 20 mg via INTRAVENOUS
  Administered 2014-05-27: 50 mg via INTRAVENOUS

## 2014-05-27 MED ORDER — FLEET ENEMA 7-19 GM/118ML RE ENEM: 1.0000 | ENEMA | Freq: Once | RECTAL | Status: AC | PRN

## 2014-05-27 MED ORDER — SODIUM CHLORIDE 0.9 % IV SOLN: 250.0000 mL | INTRAVENOUS | Status: DC

## 2014-05-27 MED ORDER — BACITRACIN 50000 UNITS IM SOLR
INTRAMUSCULAR | Status: DC | PRN
  Administered 2014-05-27: 13:00:00

## 2014-05-27 MED ORDER — NITROGLYCERIN 0.4 MG SL SUBL: 0.4000 mg | SUBLINGUAL_TABLET | SUBLINGUAL | Status: DC | PRN

## 2014-05-27 MED ORDER — MORPHINE SULFATE 2 MG/ML IJ SOLN: 1.0000 mg | INTRAMUSCULAR | Status: DC | PRN

## 2014-05-27 MED ORDER — OXYCODONE-ACETAMINOPHEN 5-325 MG PO TABS
ORAL_TABLET | ORAL | Status: AC
  Filled 2014-05-27: qty 2

## 2014-05-27 MED ORDER — PROPOFOL 10 MG/ML IV BOLUS
INTRAVENOUS | Status: DC | PRN
  Administered 2014-05-27: 20 mg via INTRAVENOUS
  Administered 2014-05-27: 140 mg via INTRAVENOUS

## 2014-05-27 MED ORDER — POLYETHYLENE GLYCOL 3350 17 G PO PACK: 17.0000 g | PACK | Freq: Every day | ORAL | Status: DC | PRN

## 2014-05-27 MED ORDER — BUPRENORPHINE 10 MCG/HR TD PTWK: 10.0000 ug | MEDICATED_PATCH | TRANSDERMAL | Status: DC

## 2014-05-27 MED ORDER — SENNA-DOCUSATE SODIUM 8.6-50 MG PO TABS: 1.0000 | ORAL_TABLET | Freq: Two times a day (BID) | ORAL | Status: DC

## 2014-05-27 MED ORDER — VILAZODONE HCL 10 MG PO TABS
40.0000 mg | ORAL_TABLET | Freq: Once | ORAL | Status: AC
  Administered 2014-05-28: 40 mg via ORAL
  Filled 2014-05-27: qty 4

## 2014-05-27 MED ORDER — HYDROMORPHONE HCL 1 MG/ML IJ SOLN
INTRAMUSCULAR | Status: AC
  Filled 2014-05-27: qty 1

## 2014-05-27 MED ORDER — THROMBIN 20000 UNITS EX SOLR
CUTANEOUS | Status: DC | PRN
  Administered 2014-05-27: 13:00:00 via TOPICAL

## 2014-05-27 MED ORDER — ONDANSETRON HCL 4 MG/2ML IJ SOLN: 4.0000 mg | Freq: Four times a day (QID) | INTRAMUSCULAR | Status: DC | PRN

## 2014-05-27 MED ORDER — SENNOSIDES-DOCUSATE SODIUM 8.6-50 MG PO TABS
1.0000 | ORAL_TABLET | Freq: Two times a day (BID) | ORAL | Status: DC
  Administered 2014-05-27 – 2014-05-30 (×6): 1 via ORAL
  Filled 2014-05-27 (×6): qty 1

## 2014-05-27 MED ORDER — OXYCODONE-ACETAMINOPHEN 5-325 MG PO TABS
1.0000 | ORAL_TABLET | ORAL | Status: DC | PRN
  Administered 2014-05-27 – 2014-05-29 (×11): 2 via ORAL
  Administered 2014-05-30: 1 via ORAL
  Administered 2014-05-30: 2 via ORAL
  Filled 2014-05-27 (×12): qty 2

## 2014-05-27 MED ORDER — SODIUM CHLORIDE 0.9 % IJ SOLN: 3.0000 mL | INTRAMUSCULAR | Status: DC | PRN

## 2014-05-27 MED ORDER — ATORVASTATIN CALCIUM 80 MG PO TABS
80.0000 mg | ORAL_TABLET | Freq: Every day | ORAL | Status: DC
  Administered 2014-05-28 – 2014-05-29 (×2): 80 mg via ORAL
  Filled 2014-05-27 (×2): qty 1

## 2014-05-27 MED ORDER — GLYCOPYRROLATE 0.2 MG/ML IJ SOLN
INTRAMUSCULAR | Status: AC
  Filled 2014-05-27: qty 3

## 2014-05-27 MED ORDER — METFORMIN HCL 500 MG PO TABS
500.0000 mg | ORAL_TABLET | Freq: Two times a day (BID) | ORAL | Status: DC
  Administered 2014-05-28 – 2014-05-30 (×5): 500 mg via ORAL
  Filled 2014-05-27 (×5): qty 1

## 2014-05-27 MED ORDER — ROCURONIUM BROMIDE 50 MG/5ML IV SOLN
INTRAVENOUS | Status: AC
  Filled 2014-05-27: qty 1

## 2014-05-27 MED ORDER — NEOSTIGMINE METHYLSULFATE 10 MG/10ML IV SOLN
INTRAVENOUS | Status: DC | PRN
  Administered 2014-05-27: 3 mg via INTRAVENOUS

## 2014-05-27 MED ORDER — ONDANSETRON HCL 4 MG/2ML IJ SOLN
INTRAMUSCULAR | Status: DC | PRN
  Administered 2014-05-27: 4 mg via INTRAVENOUS

## 2014-05-27 MED ORDER — INSULIN ASPART 100 UNIT/ML ~~LOC~~ SOLN
0.0000 [IU] | Freq: Three times a day (TID) | SUBCUTANEOUS | Status: DC
  Administered 2014-05-28 (×3): 5 [IU] via SUBCUTANEOUS
  Administered 2014-05-29 – 2014-05-30 (×4): 3 [IU] via SUBCUTANEOUS

## 2014-05-27 MED ORDER — VILAZODONE HCL 40 MG PO TABS
40.0000 mg | ORAL_TABLET | Freq: Every day | ORAL | Status: DC
  Administered 2014-05-28 – 2014-05-30 (×3): 40 mg via ORAL
  Filled 2014-05-27 (×4): qty 1

## 2014-05-27 MED ORDER — METHOCARBAMOL 500 MG PO TABS
500.0000 mg | ORAL_TABLET | Freq: Four times a day (QID) | ORAL | Status: DC | PRN
  Administered 2014-05-27: 500 mg via ORAL

## 2014-05-27 SURGICAL SUPPLY — 74 items
BAG DECANTER FOR FLEXI CONT (MISCELLANEOUS) ×2 IMPLANT
BAG URINE DRAINAGE (UROLOGICAL SUPPLIES) ×2 IMPLANT
BLADE CLIPPER SURG (BLADE) IMPLANT
BUR MATCHSTICK NEURO 3.0 LAGG (BURR) ×2 IMPLANT
CAGE COROENT LG 12X9X23-12 (Cage) ×4 IMPLANT
CANISTER SUCT 3000ML (MISCELLANEOUS) ×2 IMPLANT
CATH FOLEY 2WAY SLVR  5CC 12FR (CATHETERS) ×1
CATH FOLEY 2WAY SLVR 5CC 12FR (CATHETERS) ×1 IMPLANT
CONNECTOR RELINE 45-65 5.5 (Connector) ×2 IMPLANT
CONT SPEC 4OZ CLIKSEAL STRL BL (MISCELLANEOUS) ×4 IMPLANT
COVER BACK TABLE 60X90IN (DRAPES) ×2 IMPLANT
COVER SURGICAL LIGHT HANDLE (MISCELLANEOUS) ×2 IMPLANT
DECANTER SPIKE VIAL GLASS SM (MISCELLANEOUS) ×2 IMPLANT
DRAPE C-ARM 42X72 X-RAY (DRAPES) ×4 IMPLANT
DRAPE LAPAROTOMY 100X72X124 (DRAPES) ×2 IMPLANT
DRAPE POUCH INSTRU U-SHP 10X18 (DRAPES) ×2 IMPLANT
DRAPE PROXIMA HALF (DRAPES) ×2 IMPLANT
DRSG OPSITE POSTOP 4X6 (GAUZE/BANDAGES/DRESSINGS) ×4 IMPLANT
DURAPREP 26ML APPLICATOR (WOUND CARE) ×2 IMPLANT
ELECT REM PT RETURN 9FT ADLT (ELECTROSURGICAL) ×2
ELECTRODE REM PT RTRN 9FT ADLT (ELECTROSURGICAL) ×1 IMPLANT
GAUZE SPONGE 4X4 12PLY STRL (GAUZE/BANDAGES/DRESSINGS) IMPLANT
GAUZE SPONGE 4X4 16PLY XRAY LF (GAUZE/BANDAGES/DRESSINGS) IMPLANT
GLOVE BIO SURGEON ST LM GN SZ9 (GLOVE) ×2 IMPLANT
GLOVE BIO SURGEON STRL SZ7 (GLOVE) ×2 IMPLANT
GLOVE BIO SURGEON STRL SZ7.5 (GLOVE) ×2 IMPLANT
GLOVE BIOGEL PI IND STRL 7.5 (GLOVE) ×1 IMPLANT
GLOVE BIOGEL PI IND STRL 8 (GLOVE) ×3 IMPLANT
GLOVE BIOGEL PI IND STRL 8.5 (GLOVE) ×2 IMPLANT
GLOVE BIOGEL PI INDICATOR 7.5 (GLOVE) ×1
GLOVE BIOGEL PI INDICATOR 8 (GLOVE) ×3
GLOVE BIOGEL PI INDICATOR 8.5 (GLOVE) ×2
GLOVE ECLIPSE 7.5 STRL STRAW (GLOVE) ×8 IMPLANT
GLOVE ECLIPSE 8.5 STRL (GLOVE) ×6 IMPLANT
GLOVE EXAM NITRILE LRG STRL (GLOVE) IMPLANT
GLOVE EXAM NITRILE MD LF STRL (GLOVE) IMPLANT
GLOVE EXAM NITRILE XL STR (GLOVE) IMPLANT
GLOVE EXAM NITRILE XS STR PU (GLOVE) IMPLANT
GLOVE SURG SS PI 7.0 STRL IVOR (GLOVE) ×4 IMPLANT
GOWN STRL REUS W/ TWL LRG LVL3 (GOWN DISPOSABLE) IMPLANT
GOWN STRL REUS W/ TWL XL LVL3 (GOWN DISPOSABLE) ×2 IMPLANT
GOWN STRL REUS W/TWL 2XL LVL3 (GOWN DISPOSABLE) ×8 IMPLANT
GOWN STRL REUS W/TWL LRG LVL3 (GOWN DISPOSABLE)
GOWN STRL REUS W/TWL XL LVL3 (GOWN DISPOSABLE) ×2
HEMOSTAT POWDER KIT SURGIFOAM (HEMOSTASIS) IMPLANT
KIT BASIN OR (CUSTOM PROCEDURE TRAY) ×2 IMPLANT
KIT INFUSE X SMALL 1.4CC (Orthopedic Implant) ×2 IMPLANT
KIT ROOM TURNOVER OR (KITS) ×2 IMPLANT
LIQUID BAND (GAUZE/BANDAGES/DRESSINGS) ×2 IMPLANT
NEEDLE HYPO 22GX1.5 SAFETY (NEEDLE) ×2 IMPLANT
NS IRRIG 1000ML POUR BTL (IV SOLUTION) ×2 IMPLANT
PACK FOAM VITOSS 10CC (Orthopedic Implant) ×2 IMPLANT
PACK LAMINECTOMY NEURO (CUSTOM PROCEDURE TRAY) ×2 IMPLANT
PAD ARMBOARD 7.5X6 YLW CONV (MISCELLANEOUS) ×10 IMPLANT
PATTIES SURGICAL .5 X1 (DISPOSABLE) IMPLANT
ROD RELINE-O LORD 5.5X40 (Rod) ×4 IMPLANT
SCREW LOCK RELINE 5.5 TULIP (Screw) ×8 IMPLANT
SCREW RELINE-O POLY 6.5X40 (Screw) ×4 IMPLANT
SCREW RELINE-O POLY 6.5X45 (Screw) ×4 IMPLANT
SPONGE LAP 4X18 X RAY DECT (DISPOSABLE) IMPLANT
SPONGE SURGIFOAM ABS GEL 100 (HEMOSTASIS) ×2 IMPLANT
SUT VIC AB 1 CT1 18XBRD ANBCTR (SUTURE) ×1 IMPLANT
SUT VIC AB 1 CT1 8-18 (SUTURE) ×1
SUT VIC AB 2-0 CP2 18 (SUTURE) ×2 IMPLANT
SUT VIC AB 3-0 SH 8-18 (SUTURE) ×4 IMPLANT
SYR 20ML ECCENTRIC (SYRINGE) ×2 IMPLANT
SYR 3ML LL SCALE MARK (SYRINGE) ×8 IMPLANT
TOWEL OR 17X24 6PK STRL BLUE (TOWEL DISPOSABLE) ×2 IMPLANT
TOWEL OR 17X26 10 PK STRL BLUE (TOWEL DISPOSABLE) ×2 IMPLANT
TRAP SPECIMEN MUCOUS 40CC (MISCELLANEOUS) ×2 IMPLANT
TRAY FOLEY CATH 14FR (SET/KITS/TRAYS/PACK) IMPLANT
TRAY FOLEY CATH 14FRSI W/METER (CATHETERS) ×2 IMPLANT
TRAY FOLEY CATH 16FRSI W/METER (SET/KITS/TRAYS/PACK) IMPLANT
WATER STERILE IRR 1000ML POUR (IV SOLUTION) ×2 IMPLANT

## 2014-05-27 NOTE — Progress Notes (Signed)
Pt arrived from pacu with Lurena Joinerebecca, PACU RN, report recd. No acute distres noted. Assessment performed as charted.   Will monitor   Andrew AuVafiadis, Jaylenn Baiza I 05/27/2014 6:40 PM

## 2014-05-27 NOTE — Anesthesia Preprocedure Evaluation (Signed)
Anesthesia Evaluation  Patient identified by MRN, date of birth, ID band Patient awake    Reviewed: Allergy & Precautions, H&P , NPO status , Patient's Chart, lab work & pertinent test results  Airway Mallampati: II   Neck ROM: full    Dental   Pulmonary shortness of breath, sleep apnea , former smoker,          Cardiovascular hypertension, + angina + CAD, + Past MI and + CABG     Neuro/Psych Depression  Neuromuscular disease    GI/Hepatic GERD-  ,  Endo/Other  diabetes, Type 2obese  Renal/GU      Musculoskeletal  (+) Arthritis -, Fibromyalgia -  Abdominal   Peds  Hematology   Anesthesia Other Findings   Reproductive/Obstetrics                             Anesthesia Physical Anesthesia Plan  ASA: III  Anesthesia Plan: General   Post-op Pain Management:    Induction: Intravenous  Airway Management Planned: Oral ETT  Additional Equipment:   Intra-op Plan:   Post-operative Plan: Extubation in OR  Informed Consent: I have reviewed the patients History and Physical, chart, labs and discussed the procedure including the risks, benefits and alternatives for the proposed anesthesia with the patient or authorized representative who has indicated his/her understanding and acceptance.     Plan Discussed with: CRNA, Anesthesiologist and Surgeon  Anesthesia Plan Comments:         Anesthesia Quick Evaluation

## 2014-05-27 NOTE — Transfer of Care (Signed)
Immediate Anesthesia Transfer of Care Note  Patient: Timothy Cervantes  Procedure(s) Performed: Procedure(s): LUMBAR FOUR-FIVE POSTERIOR LUMBAR INTERBODY FUSION WITH INTERBODY PROSTHESIS,POSTERIOR LATERAL ARTHRODESIS POSTERIOR NONSEGMENTAL INSTRUMENTATION  (N/A)  Patient Location: PACU  Anesthesia Type:General  Level of Consciousness: awake, sedated and patient cooperative  Airway & Oxygen Therapy: Patient Spontanous Breathing and Patient connected to face mask oxygen  Post-op Assessment: Report given to PACU RN, Post -op Vital signs reviewed and stable and Patient moving all extremities  Post vital signs: Reviewed and stable  Complications: No apparent anesthesia complications

## 2014-05-27 NOTE — H&P (Signed)
Timothy Cervantes is an 73 y.o. male.   Chief Complaint: Bilateral lower extremity pain, back pain HPI: Patient is a 73 year old male who has had significant problems with back pain and bilateral lower extremity pain. Initially it started out his right-sided pain but then progressed over to the left. Years ago he undergone a decompression at L5-S1 on the right side. He was then found to have recurrent pain right side and was found to have spondylitic changes at L4-L5 causing lateral recess stenosis for the L5 nerve root. For a period of 8 years he would respond well to intermittent transforaminal epidural steroid injections. In the last year he is become more refractory to these. He is started to become dependent on narcotic analgesic to get through the day. 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 He's been advised regarding the need for surgical decompression and stabilization as he is also developed now early evidence of foot drops in both lower extremities.  The patient has a history of diabetes type 2. His sugars have been well controlled. About a year and a half ago he underwent open-heart surgery with 5 vessel bypass. His tolerated this well.  Past Medical History  Diagnosis Date  . Diabetes mellitus without complication     a. Dx in 2010  . Hypertension     a. Dx in 2010  . Back pain, chronic   . DJD (degenerative joint disease)   . History of tobacco abuse     a. 57 year hx, quit 03/2012.  [name]. Chronic pain   . Fibromyalgia   . CAD (coronary artery disease)     a. s/p NSTEMI 07/2012 => LHC with 3v CAD => s/p CABG 08/25/12 (LIMA-LAD, SVG-DX, SVG-OM1/OM2, SVG-PDA) Dr. Tyrone SageGerhardt;  b.  Echo 08/24/12:  Mild LVH, EF 65-70%, mild RVE, PASP 36  . Chronic back pain   . HLD (hyperlipidemia)   . Shortness of breath dyspnea     with extertion  . GERD (gastroesophageal reflux disease)   . Complication of anesthesia     hard to wake up  .  Sleep apnea     Past Surgical History  Procedure Laterality Date  . Right ankle surgery    . Cholecystectomy    . Bilateral rotator cuff repair    . Transurethral resection of prostate    . Vocal cord tumor resection    . Parotid gland resection- non cancerous Left     parotid gland resection-non cancerous  . Coronary artery bypass graft N/A 08/25/2012    Procedure: CORONARY ARTERY BYPASS GRAFTING (CABG);  Surgeon: Delight OvensEdward B Gerhardt, MD;  [facility];  Service: Open Heart Surgery;  Laterality: N/A;  Coronary artery bypass graft times five using left internal mammary artery and bilateral saphenous leg vein using endoscope.  . Intraoperative transesophageal echocardiogram N/A 08/25/2012    Procedure: INTRAOPERATIVE TRANSESOPHAGEAL ECHOCARDIOGRAM;  Surgeon: Delight OvensEdward B Gerhardt, MD;  [facility];  Service: Open Heart Surgery;  Laterality: N/A;  . Left heart catheterization with coronary angiogram N/A 08/23/2012    Procedure: LEFT HEART CATHETERIZATION WITH CORONARY ANGIOGRAM;  Surgeon: Kathleene Hazelhristopher D McAlhany, MD;  [facility];  Service: Cardiovascular;  Laterality: N/A;    Family History  Problem Relation Age of Onset  . Heart attack Father     died @ 6959  . Lung cancer Mother     died @ 4585  . Other Sister  A&W in her 70's  . Bladder Cancer Brother     alive   Social History:  reports that he has quit smoking. His smoking use included Cigarettes. He has a 57 pack-year smoking history. He has quit using smokeless tobacco. His smokeless tobacco use included Chew. He reports that he drinks alcohol. He reports that he does not use illicit drugs.  Allergies: No Known Allergies  Medications Prior to Admission  Medication Sig Dispense Refill  . aspirin EC 325 MG EC tablet Take 1 tablet (325 mg total) by mouth daily. 30 tablet   . atorvastatin (LIPITOR) 80 MG tablet TAKE 1 TABLET BY MOUTH EVERY DAY AT 6 PM 30 tablet 6  . buprenorphine (BUTRANS - DOSED MCG/HR) 10 MCG/HR PTWK  Place 10 mcg onto the skin once a week.    [name] buPROPion (WELLBUTRIN SR) 150 MG 12 hr tablet Take 150 mg by mouth daily.    . cholecalciferol (VITAMIN D) 1000 UNITS tablet Take 1,000 Units by mouth daily.    . clonazePAM (KLONOPIN) 1 MG tablet Take 1 mg by mouth at bedtime. 1 tab nightly    . furosemide (LASIX) 40 MG tablet Take 1 tablet (40 mg total) by mouth 2 (two) times daily. (Patient taking differently: Take 40 mg by mouth daily. ) 180 tablet 2  . metFORMIN (GLUCOPHAGE) 500 MG tablet Take 500 mg by mouth 2 (two) times daily with a meal.     . oxyCODONE-acetaminophen (PERCOCET/ROXICET) 5-325 MG per tablet Take 1-2 tablets by mouth every 6 (six) hours as needed for severe pain.    . pantoprazole (PROTONIX) 40 MG tablet Take 40 mg by mouth daily.     . potassium chloride SA (K-DUR,KLOR-CON) 20 MEQ tablet Take 20 mEq by mouth 2 (two) times daily.     . sennosides-docusate sodium (SENOKOT-S) 8.6-50 MG tablet Take 1 tablet by mouth 2 (two) times daily.    . traMADol (ULTRAM) 50 MG tablet Take 50 mg by mouth every 12 (twelve) hours as needed for moderate pain.   0  . Vilazodone HCl (VIIBRYD) 40 MG TABS Take 40 mg by mouth daily.    . nitroGLYCERIN (NITROSTAT) 0.4 MG SL tablet Place 1 tablet (0.4 mg total) under the tongue every 5 (five) minutes as needed for chest pain. 25 tablet 6  . oxycodone (OXY-IR) 5 MG capsule Take 5 mg by mouth every 4 (four) hours as needed.    . potassium chloride SA (K-DUR,KLOR-CON) 20 MEQ tablet TAKE 2 TABLET BY MOUTH DAILY (Patient not taking: Reported on 05/22/2014) 60 tablet 3    Results for orders placed or performed during the hospital encounter of 05/27/14 (from the past 48 hour(s))  Glucose, capillary     Status: Abnormal   Collection Time: 05/27/14  9:44 AM  Result Value Ref Range   Glucose-Capillary 136 (H) 70 - 99 mg/dL   No results found.  Review of Systems  Constitutional:       Neuro weakness in both legs with loss of stamina on feet and increased pain   HENT: Negative.   Respiratory: Negative.   Cardiovascular: Negative.   Gastrointestinal: Negative.   Genitourinary: Negative.   Musculoskeletal: Positive for back pain.  Skin: Negative.   Neurological: Positive for weakness.  Psychiatric/Behavioral: Negative.     Blood pressure 112/61, pulse 73, temperature 98.7 F (37.1 C), temperature source Oral, resp. rate 18, height 6\' 1"  (1.854 m), weight 108.863 kg (240 lb), SpO2 96 %. Physical Exam  Constitutional: He  is oriented to person, place, and time. He appears well-developed and well-nourished.  HENT:  Head: Atraumatic.  Eyes: Conjunctivae and EOM are normal. Pupils are equal, round, and reactive to light.  Neck: Normal range of motion. Neck supple.  Cardiovascular: Normal rate and regular rhythm.   Respiratory: Effort normal and breath sounds normal.  GI: Soft. Bowel sounds are normal.  Neurological: He is alert and oriented to person, place, and time.  Motor function reveals mild weakness in both tibialis anterior groups rated at 4 minus out of 5. Sensation appears intact on the dorsum of both feet and shins. Absent Achilles reflexes bilaterally.  Skin: Skin is warm and dry.  Psychiatric: He has a normal mood and affect. His behavior is normal. Judgment and thought content normal.     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  Surgical decompression of L4-L5 with posterior lumbar interbody arthrodesis.  Dannie Hattabaugh J 05/27/2014, 10:07 AM

## 2014-05-27 NOTE — Op Note (Signed)
Date of surgery: 05/27/2014 Preoperative diagnosis: Lumbar spondylolisthesis and stenosis L4-L5 with bilateral L5 radiculopathies. Postoperative diagnosis: Lumbar spondylolisthesis and stenosis L4-L5 with bilateral L5 radiculopathies  Procedure: L4-L5 decompressive laminectomy decompression of L4 and L5 nerve roots, posterior lumbar interbody arthrodesis with peek spacers local autograft and allograft, pedicle screw fixation L4-L5, posterior lateral arthrodesis L4-L5  Surgeon: Barnett AbuHenry Sumayyah Custodio M.D.  Asst.: Lelon PerlaHenry Poole M.D.  Indications: Patient is Mellody MemosWayne T Soman is a 73 y.o. male who who's had significant back pain and lumbar radiculopathy for over a years period time. A lumbar myelogram demonstrates advanced spondylolisthesis with high-grade canal stenosis. he was advised regarding surgical intervention.  Procedure: The patient was brought to the operating room supine on a stretcher. After the smooth induction of general endotracheal anesthesia she was turned prone and the back was prepped with alcohol and DuraPrep. The back was then draped sterilely. A midline incision was created and carried down to the lumbar dorsal fascia. A localizing radiograph identified the L4 and L5 spinous processes. A subligamentous dissection was created at L4 and L5 to expose the interlaminar space at L4 and L5 and the facet joints over the L4-L5 interspace. Laminotomies were were then created removing the entire inferior margin of the lamina of L4 including the inferior facet at the L4-L5 joint. The yellow ligament was taken up and the common dural tube was exposed along with the L4 nerve root superiorly, and the L5 nerve root inferiorly, the disc space was exposed and epidural veins in this region were cauterized and divided. The L4 nerve roots and the L5 nerve root were dissected with care taken to protect them. The disc space was opened and a combination of curettes and rongeurs was used to evacuate the disc space fully.  The endplates were removed using sharp curettes. An interbody spacer was placed to distract the disc space while the contralateral discectomy was performed. When the entirety of the disc was removed and the endplates were prepared final sizing of the disc space was obtained 12 mm, 12 lordotic, 23 mm long peek spacers were chosen and packed with autograft and allograft and placed into the interspace. The remainder of the interspace was packed with autograft and allograft in addition to 2 strips of infuse.[name]. Pedicle entry sites were then chosen using fluoroscopic guidance and 6.5 x 40 mm screws were placed in L4 and 6.5 x 45 mm screws were placed in L5. The lateral gutters were decorticated and graft was packed in the posterolateral gutters between L4 and L5 along with 2 additional strips of infuse from an extra small portion of infuse. Final radiographs were obtained after placing appropriately sized rods between the pedicle screws at L4-L5 and torquing these to the appropriate tension. A transverse connector measuring 50 mm in diameter was placed between the 2 rods to maintain vertical integrity. The surgical site was inspected carefully to assure the L4 and L5 nerve roots were well decompressed, hemostasis was obtained, and the graft was well packed. Then the retractors were removed and the wound was closed with #1 Vicryl in the lumbar dorsal fascia 2-0 Vicryl in the subcutaneous tissue and 3-0 Vicryl subcuticularly. When he cc of half percent Marcaine was injected into the paraspinous musculature at the time of closure. Blood loss was estimated at 250 cc. The patient tolerated procedure well and was returned to the recovery room in stable condition.

## 2014-05-27 NOTE — Anesthesia Procedure Notes (Signed)
Procedure Name: Intubation Date/Time: 05/27/2014 12:12 PM Performed by: Coralee RudFLORES, Jerra Huckeby Pre-anesthesia Checklist: Patient identified, Emergency Drugs available, Suction available and Patient being monitored Patient Re-evaluated:Patient Re-evaluated prior to inductionOxygen Delivery Method: Circle system utilized Preoxygenation: Pre-oxygenation with 100% oxygen Intubation Type: IV induction Ventilation: Mask ventilation without difficulty Laryngoscope Size: Miller and 3 Grade View: Grade I Tube type: Oral Tube size: 7.5 mm Number of attempts: 1 Airway Equipment and Method: Stylet Placement Confirmation: ETT inserted through vocal cords under direct vision,  positive ETCO2 and breath sounds checked- equal and bilateral Secured at: 23 cm Tube secured with: Tape Dental Injury: Teeth and Oropharynx as per pre-operative assessment

## 2014-05-27 NOTE — Anesthesia Postprocedure Evaluation (Signed)
Anesthesia Post Note  Patient: Mellody MemosWayne T Billig  Procedure(s) Performed: Procedure(s) (LRB): LUMBAR FOUR-FIVE POSTERIOR LUMBAR INTERBODY FUSION WITH INTERBODY PROSTHESIS,POSTERIOR LATERAL ARTHRODESIS POSTERIOR NONSEGMENTAL INSTRUMENTATION  (N/A)  Anesthesia type: General  Patient location: PACU  Post pain: Pain level controlled and Adequate analgesia  Post assessment: Post-op Vital signs reviewed, Patient's Cardiovascular Status Stable, Respiratory Function Stable, Patent Airway and Pain level controlled  Last Vitals:  Filed Vitals:   05/27/14 1751  BP: 106/59  Pulse: 96  Temp:   Resp: 15    Post vital signs: Reviewed and stable  Level of consciousness: awake, alert  and oriented  Complications: No apparent anesthesia complications

## 2014-05-28 ENCOUNTER — Other Ambulatory Visit: Payer: Self-pay | Admitting: Cardiovascular Disease

## 2014-05-28 LAB — BASIC METABOLIC PANEL
ANION GAP: 11 (ref 5–15)
BUN: 15 mg/dL (ref 6–23)
CHLORIDE: 104 meq/L (ref 96–112)
CO2: 22 mmol/L (ref 19–32)
Calcium: 8.4 mg/dL (ref 8.4–10.5)
Creatinine, Ser: 1.12 mg/dL (ref 0.50–1.35)
GFR calc non Af Amer: 64 mL/min — ABNORMAL LOW (ref >90)
GFR, EST AFRICAN AMERICAN: 74 mL/min — AB (ref >90)
Glucose, Bld: 219 mg/dL — ABNORMAL HIGH (ref 70–99)
Potassium: 4.1 mmol/L (ref 3.5–5.1)
SODIUM: 137 mmol/L (ref 135–145)

## 2014-05-28 LAB — CBC
HCT: 37.3 % — ABNORMAL LOW (ref 39.0–52.0)
Hemoglobin: 12.3 g/dL — ABNORMAL LOW (ref 13.0–17.0)
MCH: 29.1 pg (ref 26.0–34.0)
MCHC: 33 g/dL (ref 30.0–36.0)
MCV: 88.4 fL (ref 78.0–100.0)
PLATELETS: 197 10*3/uL (ref 150–400)
RBC: 4.22 MIL/uL (ref 4.22–5.81)
RDW: 13.6 % (ref 11.5–15.5)
WBC: 8.1 10*3/uL (ref 4.0–10.5)

## 2014-05-28 LAB — GLUCOSE, CAPILLARY
GLUCOSE-CAPILLARY: 202 mg/dL — AB (ref 70–99)
GLUCOSE-CAPILLARY: 167 mg/dL — AB (ref 70–99)
Glucose-Capillary: 219 mg/dL — ABNORMAL HIGH (ref 70–99)
Glucose-Capillary: 227 mg/dL — ABNORMAL HIGH (ref 70–99)

## 2014-05-28 MED ORDER — MAGNESIUM HYDROXIDE 400 MG/5ML PO SUSP
30.0000 mL | Freq: Every day | ORAL | Status: DC
  Administered 2014-05-28 – 2014-05-30 (×3): 30 mL via ORAL
  Filled 2014-05-28 (×3): qty 30

## 2014-05-28 NOTE — Progress Notes (Signed)
Pt ambulated around nursing station approx 250 feet with walker and brace on. No acute distress noted. Pt tolerated well.   Andrew AuVafiadis, Kylil Swopes I 05/28/2014 1:37 PM

## 2014-05-28 NOTE — Progress Notes (Signed)
Patient ID: Timothy Cervantes, male   DOB: [birth year], 73 y.o.   MRN: [id number] Vital signs are stable Motor function appears intact in both lower extremities Pain seems reasonably well-managed Continue to mobilize today Start milk of magnesia for constipation Reevaluate tomorrow

## 2014-05-28 NOTE — Progress Notes (Signed)
CARE MANAGEMENT NOTE 05/28/2014  Patient:  Timothy Cervantes,Timothy Cervantes   Account Number:  [id number]  Date Initiated:  05/28/2014  Documentation initiated by:  Jiles CrockerHANDLER,Maicee Ullman  Subjective/Objective Assessment:   ADMITTED FOR SURGERY     Action/Plan:   CM FOLLOWING FOR DCP   Anticipated DC Date:  05/31/2014   Anticipated DC Plan:  AWAITING FOR PT/OT EVALS FOR DISPOSITION NEEDS     DC Planning Services  CM consult         Status of service:  In process, will continue to follow Medicare Important Message given?   (If response is "NO", the following Medicare IM given date fields will be blank)  Per UR Regulation:  Reviewed for med. necessity/level of care/duration of stay  Comments:  12/29/2015Abelino Derrick- B Juanangel Soderholm RN,BSN,MHA [phone]

## 2014-05-28 NOTE — Progress Notes (Signed)
Occupational Therapy Evaluation Patient Details Name: Timothy Cervantes MRN: [id number] DOB: 1941/04/28 Today's Date: 05/28/2014    History of Present Illness Patient is a 73 y/o male s/p L4-5 POSTERIOR LUMBAR INTERBODY FUSION WITH INTERBODY PROSTHESIS,POSTERIOR LATERAL ARTHRODESIS POSTERIOR NONSEGMENTAL INSTRUMENTATION. PMH of HTN, DM, CAD and s/p CABG 2014.   Clinical Impression   Patient evaluated by Occupational Therapy with no further acute OT needs identified. All education has been completed and the patient has no further questions. See below for any follow-up Occupational Therapy or equipment needs. OT to sign off. Thank you for referral.   Pt will have (A) from wife as needed and wife is Charity fundraiserN. Pt pleasant and at adequate level for d/c from OT standpoint.    Follow Up Recommendations  No OT follow up    Equipment Recommendations  None recommended by OT    Recommendations for Other Services       Precautions / Restrictions Precautions Precautions: Back Precaution Booklet Issued: No Precaution Comments: Reviewed back precautions. Required Braces or Orthoses: Spinal Brace Spinal Brace: Lumbar corset Restrictions Weight Bearing Restrictions: No      Mobility Bed Mobility Overal bed mobility: Needs Assistance Bed Mobility: Rolling;Sidelying to Sit Rolling: Supervision Sidelying to sit: Supervision       General bed mobility comments: in chair ona rrival  Transfers Overall transfer level: Modified independent Equipment used: Rolling walker (2 wheeled) Transfers: Sit to/from Stand Sit to Stand: Min guard         General transfer comment: Cues to adhere to back precautions upon standing. Min guard for safety.     Balance Overall balance assessment: Needs assistance Sitting-balance support: Feet supported;No upper extremity supported Sitting balance-Leahy Scale: Good Sitting balance - Comments: Able to reach outside BoS and donn LSO and adjust gown without  difficulty.    Standing balance support: During functional activity Standing balance-Leahy Scale: Fair Standing balance comment: Tolerated static standing w/out UE support however requires BUE support for longer distances for safety.                             ADL Overall ADL's : At baseline                                       General ADL Comments: educated on back precautions with adls. pt reports "i am not trying to be smart but my wife just went through this too so I know what to do ". pt educated on cups for oral care     Vision                     Perception     Praxis      Pertinent Vitals/Pain Pain Assessment: 0-10 Pain Score: 5  Pain Location: back Pain Descriptors / Indicators: Discomfort Pain Intervention(s): Premedicated before session     Hand Dominance Right   Extremity/Trunk Assessment Upper Extremity Assessment Upper Extremity Assessment: Overall WFL for tasks assessed   Lower Extremity Assessment Lower Extremity Assessment: Overall WFL for tasks assessed   Cervical / Trunk Assessment Cervical / Trunk Assessment: Normal   Communication Communication Communication: No difficulties   Cognition Arousal/Alertness: Awake/alert Behavior During Therapy: WFL for tasks assessed/performed Overall Cognitive Status: Within Functional Limits for tasks assessed (very talkive )       Memory: Decreased recall of precautions  General Comments       Exercises       Shoulder Instructions      Home Living Family/patient expects to be discharged to:: Private residence Living Arrangements: Spouse/significant other Available Help at Discharge: Family;Available 24 hours/day Type of Home: House Home Access: Stairs to enter Entergy CorporationEntrance Stairs-Number of Steps: 3 Entrance Stairs-Rails: Right Home Layout: One level     Bathroom Shower/Tub: Chief Strategy OfficerTub/shower unit   Bathroom Toilet: Standard     Home Equipment:  Environmental consultantWalker - 4 wheels;Cane - single point          Prior Functioning/Environment Level of Independence: Independent with assistive device(s)        Comments: Pt using SPC for the last couple weeks due to pain in back.    OT Diagnosis:     OT Problem List:     OT Treatment/Interventions:      OT Goals(Current goals can be found in the care plan section) Acute Rehab OT Goals Patient Stated Goal: to get moving.  OT Frequency:     Barriers to D/C:            Co-evaluation              End of Session Equipment Utilized During Treatment: Gait belt;Rolling walker;Back brace  Activity Tolerance: Patient tolerated treatment well Patient left: in chair;with call bell/phone within reach;with chair alarm set;with family/visitor present   Time: 1030-1057 OT Time Calculation (min): 27 min Charges:  OT General Charges $OT Visit: 1 Procedure OT Evaluation $Initial OT Evaluation Tier I: 1 Procedure OT Treatments $Self Care/Home Management : 8-22 mins G-Codes:    Timothy Cervantes, Timothy Cervantes 05/28/2014, 1:32 PM Pager: [phone]

## 2014-05-28 NOTE — Evaluation (Signed)
Physical Therapy Evaluation Patient Details Name: Timothy Cervantes MRN: [id number] DOB: April 29, 1941 Today's Date: 05/28/2014   History of Present Illness  Patient is Cervantes 73 y/o male s/p L4-5 POSTERIOR LUMBAR INTERBODY FUSION WITH INTERBODY PROSTHESIS,POSTERIOR LATERAL ARTHRODESIS POSTERIOR NONSEGMENTAL INSTRUMENTATION. PMH of HTN, DM, CAD and s/p CABG 2014.    Clinical Impression  Patient presents with increased pain in back and radiating into LLE s/p bove surgery impacting mobility. Education provided on back precautions and log roll technique. Pt mildly impulsive with all mobility requiring constant cues for safety. Tolerated stair negotiation with Min guard assist for safety. Pt would benefit from skilled PT to improve transfers, gait, balance and mobility so pt can maximize independence and return to PLOF.    Follow Up Recommendations No PT follow up;Supervision - Intermittent    Equipment Recommendations  None recommended by PT    Recommendations for Other Services       Precautions / Restrictions Precautions Precautions: Back Precaution Booklet Issued: No Precaution Comments: Reviewed back precautions. Required Braces or Orthoses: Spinal Brace Spinal Brace: Lumbar corset Restrictions Weight Bearing Restrictions: No      Mobility  Bed Mobility Overal bed mobility: Needs Assistance Bed Mobility: Rolling;Sidelying to Sit Rolling: Supervision Sidelying to sit: Supervision       General bed mobility comments: HOB flat, use of rails. Supervision for safety. Cues for log roll technique.  Transfers Overall transfer level: Needs assistance Equipment used: Rolling walker (2 wheeled) Transfers: Sit to/from Stand Sit to Stand: Min guard         General transfer comment: Cues to adhere to back precautions upon standing. Min guard for safety.   Ambulation/Gait Ambulation/Gait assistance: Min guard Ambulation Distance (Feet): 400 Feet Assistive device: Rolling walker (2  wheeled) Gait Pattern/deviations: Step-through pattern;Decreased stride length     General Gait Details: Cues for upright posture and to adhere to back precautions during gait. 1 seated rest break. Vitals stable.  Stairs Stairs: Yes Stairs assistance: Min guard Stair Management: Two rails;Alternating pattern Number of Stairs: 3 (+ 3 steps x2 bouts.) General stair comments: Cues for technique.  Wheelchair Mobility    Modified Rankin (Stroke Patients Only)       Balance Overall balance assessment: Needs assistance Sitting-balance support: Feet supported;No upper extremity supported Sitting balance-Leahy Scale: Good Sitting balance - Comments: Able to reach outside BoS and donn LSO and adjust gown without difficulty.    Standing balance support: During functional activity Standing balance-Leahy Scale: Fair Standing balance comment: Tolerated static standing w/out UE support however requires BUE support for longer distances for safety.                              Pertinent Vitals/Pain Pain Assessment: 0-10 Pain Score: 6  (reports pt decreased post ambulation bout.) Pain Location: back and radiating into LLE. Pain Descriptors / Indicators: Shooting;Sore;Aching Pain Intervention(s): Monitored during session;Premedicated before session;Repositioned    Home Living Family/patient expects to be discharged to:: Private residence Living Arrangements: Spouse/significant other Available Help at Discharge: Family;Available 24 hours/day Type of Home: House Home Access: Stairs to enter Entrance Stairs-Rails: Right Entrance Stairs-Number of Steps: 3 Home Layout: One level Home Equipment: Walker - 4 wheels;Cane - single point      Prior Function Level of Independence: Independent with assistive device(s)         Comments: Pt using SPC for the last couple weeks due to pain in back.  Hand Dominance        Extremity/Trunk Assessment   Upper Extremity  Assessment: Overall WFL for tasks assessed           Lower Extremity Assessment: Overall WFL for tasks assessed         Communication   Communication: No difficulties  Cognition Arousal/Alertness: Awake/alert Behavior During Therapy: WFL for tasks assessed/performed Overall Cognitive Status: Within Functional Limits for tasks assessed       Memory: Decreased recall of precautions              General Comments General comments (skin integrity, edema, etc.): Surgical incision - clean, dry and intact.     Exercises        Assessment/Plan    PT Assessment Patient needs continued PT services  PT Diagnosis Difficulty walking;Acute pain   PT Problem List Pain;Decreased balance;Decreased knowledge of precautions;Decreased mobility  PT Treatment Interventions Balance training;Gait training;Functional mobility training;Patient/family education;Stair training;Therapeutic activities;Therapeutic exercise   PT Goals (Current goals can be found in the Care Plan section) Acute Rehab PT Goals Patient Stated Goal: to get moving. PT Goal Formulation: With patient Time For Goal Achievement: 06/11/14 Potential to Achieve Goals: Good    Frequency Min 5X/week   Barriers to discharge        Co-evaluation               End of Session Equipment Utilized During Treatment: Gait belt Activity Tolerance: Patient tolerated treatment well Patient left: in chair;with call bell/phone within reach;with chair alarm set Nurse Communication: Mobility status;Precautions         Time: [id number] PT Time Calculation (min) (ACUTE ONLY): 31 min   Charges:   PT Evaluation $Initial PT Evaluation Tier I: 1 Procedure PT Treatments $Gait Training: 8-22 mins   PT G CodesAlvie Cervantes:        Timothy Cervantes, Timothy Cervantes 05/28/2014, 10:37 AM Timothy HeidelbergShauna Timothy Cervantes, PT, DPT [phone]

## 2014-05-29 LAB — GLUCOSE, CAPILLARY
GLUCOSE-CAPILLARY: 157 mg/dL — AB (ref 70–99)
GLUCOSE-CAPILLARY: 187 mg/dL — AB (ref 70–99)
Glucose-Capillary: 172 mg/dL — ABNORMAL HIGH (ref 70–99)
Glucose-Capillary: 168 mg/dL — ABNORMAL HIGH (ref 70–99)

## 2014-05-29 MED ORDER — OXYCODONE-ACETAMINOPHEN 5-325 MG PO TABS: 1.0000 | ORAL_TABLET | ORAL | Status: DC | PRN

## 2014-05-29 MED ORDER — DEXAMETHASONE 2 MG PO TABS
1.0000 mg | ORAL_TABLET | Freq: Two times a day (BID) | ORAL | Status: DC
  Administered 2014-05-29 – 2014-05-30 (×2): 1 mg via ORAL
  Filled 2014-05-29 (×2): qty 1

## 2014-05-29 MED ORDER — FLEET ENEMA 7-19 GM/118ML RE ENEM: 1.0000 | ENEMA | Freq: Every day | RECTAL | Status: DC | PRN

## 2014-05-29 MED ORDER — BISACODYL 10 MG RE SUPP: 10.0000 mg | Freq: Every day | RECTAL | Status: DC | PRN

## 2014-05-29 MED ORDER — METHOCARBAMOL 500 MG PO TABS: 500.0000 mg | ORAL_TABLET | Freq: Four times a day (QID) | ORAL | Status: DC | PRN

## 2014-05-29 MED ORDER — DEXAMETHASONE 0.75 MG PO TABS: ORAL_TABLET | ORAL | Status: DC

## 2014-05-29 MED FILL — Sodium Chloride IV Soln 0.9%: INTRAVENOUS | Qty: 1000 | Status: AC

## 2014-05-29 MED FILL — Heparin Sodium (Porcine) Inj 1000 Unit/ML: INTRAMUSCULAR | Qty: 30 | Status: AC

## 2014-05-29 NOTE — Discharge Summary (Signed)
Physician Discharge Summary  Patient ID: Timothy Cervantes MRN: [id number] DOB/AGE: [birth year] 73 y.o.  Admit date: 05/27/2014 Discharge date: 05/29/2014  Admission Diagnoses: Spondylolisthesis L4-L5 with bilateral L5 radiculopathies, tibialis anterior weakness, diabetes mellitus  Discharge Diagnoses: Spondylolisthesis L4-L5 with bilateral L5 radiculopathies, tibialis anterior weakness, diabetes mellitus Active Problems:   Spondylolisthesis of lumbar region   Discharged Condition: good  Hospital Course: Patient was admitted to undergo surgical decompression of severe spondylitic stenosis secondary to generative spondylolisthesis at L4-L5 with resulting foot drops. Patient did well with surgery he's had some improvement in his strength in tibialis anterior group. Is discharged home after his bowels have moved in his abdomen is softer.  Consults: None  Significant Diagnostic Studies: None  Treatments: surgery: Decompression L4-L5 with posterior lumbar interbody arthrodesis L4-L5 arthrodesis with local autograft and allograft, peak spacers, pedicle screw fixation and posterior lateral fusion L4-L5  Discharge Exam: Blood pressure 107/60, pulse 82, temperature 97.7 F (36.5 C), temperature source Oral, resp. rate 20, height 6\' 1"  (1.854 m), weight 108.863 kg (240 lb), SpO2 97 %. Incision is clean and dry. Motor function is 4+ out of 5 in tibialis anterior group bilaterally other strength is normal. Sensation is intact in lower extremities.  Disposition: 01-Home or Self Care  Discharge Instructions    Call MD for:  redness, tenderness, or signs of infection (pain, swelling, redness, odor or green/yellow discharge around incision site)    Complete by:  As directed      Call MD for:  severe uncontrolled pain    Complete by:  As directed      Call MD for:  temperature >100.4    Complete by:  As directed      Diet - low sodium heart healthy    Complete by:  As directed      Discharge  instructions    Complete by:  As directed   Okay to shower. Do not apply salves or appointments to incision. No heavy lifting with the upper extremities greater than 15 pounds. May resume driving when not requiring pain medication and patient feels comfortable with doing so.     Increase activity slowly    Complete by:  As directed             Medication List    TAKE these medications        aspirin 325 MG EC tablet  Take 1 tablet (325 mg total) by mouth daily.     atorvastatin 80 MG tablet  Commonly known as:  LIPITOR  TAKE 1 TABLET BY MOUTH EVERY DAY AT 6 PM     buprenorphine 10 MCG/HR Ptwk patch  Commonly known as:  BUTRANS - dosed mcg/hr  Place 10 mcg onto the skin once a week.     buPROPion 150 MG 12 hr tablet  Commonly known as:  WELLBUTRIN SR  Take 150 mg by mouth daily.     cholecalciferol 1000 UNITS tablet  Commonly known as:  VITAMIN D  Take 1,000 Units by mouth daily.     clonazePAM 1 MG tablet  Commonly known as:  KLONOPIN  Take 1 mg by mouth at bedtime. 1 tab nightly     dexamethasone 0.75 MG tablet  Commonly known as:  DECADRON  2 tablets twice daily for 2 days, one tablet twice daily for 2 days, one tablet daily for 2 days.     furosemide 40 MG tablet  Commonly known as:  LASIX  Take 1 tablet (40 mg total)  by mouth 2 (two) times daily.     metFORMIN 500 MG tablet  Commonly known as:  GLUCOPHAGE  Take 500 mg by mouth 2 (two) times daily with a meal.     methocarbamol 500 MG tablet  Commonly known as:  ROBAXIN  Take 1 tablet (500 mg total) by mouth every 6 (six) hours as needed for muscle spasms.     nitroGLYCERIN 0.4 MG SL tablet  Commonly known as:  NITROSTAT  Place 1 tablet (0.4 mg total) under the tongue every 5 (five) minutes as needed for chest pain.     oxycodone 5 MG capsule  Commonly known as:  OXY-IR  Take 5 mg by mouth every 4 (four) hours as needed.     oxyCODONE-acetaminophen 5-325 MG per tablet  Commonly known as:   PERCOCET/ROXICET  Take 1-2 tablets by mouth every 6 (six) hours as needed for severe pain.     oxyCODONE-acetaminophen 5-325 MG per tablet  Commonly known as:  PERCOCET/ROXICET  Take 1-2 tablets by mouth every 4 (four) hours as needed for moderate pain.     pantoprazole 40 MG tablet  Commonly known as:  PROTONIX  Take 40 mg by mouth daily.     potassium chloride SA 20 MEQ tablet  Commonly known as:  K-DUR,KLOR-CON  Take 20 mEq by mouth 2 (two) times daily.     potassium chloride SA 20 MEQ tablet  Commonly known as:  K-DUR,KLOR-CON  TAKE 2 TABLET BY MOUTH DAILY     sennosides-docusate sodium 8.6-50 MG tablet  Commonly known as:  SENOKOT-S  Take 1 tablet by mouth 2 (two) times daily.     traMADol 50 MG tablet  Commonly known as:  ULTRAM  Take 50 mg by mouth every 12 (twelve) hours as needed for moderate pain.     VIIBRYD 40 MG Tabs  Generic drug:  Vilazodone HCl  Take 40 mg by mouth daily.         SignedStefani Dama: Errik Mitchelle J 05/29/2014, 11:54 AM

## 2014-05-29 NOTE — Progress Notes (Signed)
Patient ID: Mellody MemosWayne T Zakrzewski, male   DOB: 17-Aug-1940, 73 y.o.   MRN: [id number] Vital signs are stable. Motor function appears stable Bowels have not moved yet Incision is clean and dry Will allow shower Additional laxatives for bowel movement Plan discharge tomorrow

## 2014-05-29 NOTE — Progress Notes (Signed)
Physical Therapy Treatment Patient Details Name: Timothy Cervantes MRN: [id number] DOB: Jan 23, 1941 Today's Date: 05/29/2014    History of Present Illness Patient is a 73 y/o male s/p L4-5 POSTERIOR LUMBAR INTERBODY FUSION WITH INTERBODY PROSTHESIS,POSTERIOR LATERAL ARTHRODESIS POSTERIOR NONSEGMENTAL INSTRUMENTATION. PMH of HTN, DM, CAD and s/p CABG 2014.    PT Comments    Patient stated he felt as if he "overdid" it yesterday. Increased difficulty and effort with stand. Cues for maintaining precautions with stand. Patient agreeable to ambulate to work out soreness. Returned to sidelying at end of session.   Follow Up Recommendations  No PT follow up;Supervision - Intermittent     Equipment Recommendations  None recommended by PT    Recommendations for Other Services       Precautions / Restrictions Precautions Precautions: Back Precaution Comments: Reviewed back precautions. Required Braces or Orthoses: Spinal Brace Spinal Brace: Lumbar corset Restrictions Weight Bearing Restrictions: No    Mobility  Bed Mobility   Bed Mobility: Sit to Sidelying;Rolling Rolling: Supervision       Sit to sidelying: Supervision General bed mobility comments: Patient with good log roll technique. CUes for positioning up in bed prior to sidelying  Transfers   Equipment used: Rolling walker (2 wheeled) Transfers: Sit to/from Stand Sit to Stand: Min guard         General transfer comment: Cues to adhere to back precautions upon standing. Min guard for safety. Patient with increased effort, bend and twist this session upon standing  Ambulation/Gait Ambulation/Gait assistance: Supervision Ambulation Distance (Feet): 180 Feet Assistive device: Rolling walker (2 wheeled) Gait Pattern/deviations: Step-through pattern;Decreased stride length     General Gait Details: Cues for upright posture and to adhere to back precautions during gait.    Stairs         General stair  comments: Not attempted today. Patient stated "i have no issues with steps"   Wheelchair Mobility    Modified Rankin (Stroke Patients Only)       Balance                                    Cognition Arousal/Alertness: Awake/alert Behavior During Therapy: WFL for tasks assessed/performed Overall Cognitive Status: Within Functional Limits for tasks assessed                      Exercises      General Comments        Pertinent Vitals/Pain Pain Score: 6  Pain Location: back Pain Descriptors / Indicators: Sore Pain Intervention(s): Monitored during session;Repositioned    Home Living                      Prior Function            PT Goals (current goals can now be found in the care plan section) Progress towards PT goals: Progressing toward goals    Frequency  Min 5X/week    PT Plan Current plan remains appropriate    Co-evaluation             End of Session Equipment Utilized During Treatment: Back brace Activity Tolerance: Patient tolerated treatment well Patient left: with call bell/phone within reach;in bed     Time: 0801-0825 PT Time Calculation (min) (ACUTE ONLY): 24 min  Charges:  $Gait Training: 8-22 mins $Therapeutic Activity: 8-22 mins  G Codes:      Fredrich BirksRobinette, Julia Elizabeth 05/29/2014, 8:30 AM 05/29/2014 Fredrich Birksobinette, Julia Elizabeth PTA [phone] pager [phone] office

## 2014-05-30 LAB — GLUCOSE, CAPILLARY
Glucose-Capillary: 174 mg/dL — ABNORMAL HIGH (ref 70–99)
Glucose-Capillary: 139 mg/dL — ABNORMAL HIGH (ref 70–99)

## 2014-05-30 MED ORDER — KETOROLAC TROMETHAMINE 30 MG/ML IJ SOLN
30.0000 mg | Freq: Once | INTRAMUSCULAR | Status: AC
  Administered 2014-05-30: 30 mg via INTRAVENOUS
  Filled 2014-05-30: qty 1

## 2014-05-30 NOTE — Progress Notes (Signed)
D/C orders received, pt for D/C home today.  IV and telemetry D/C.  Rx and D/C instructions given with verbalized understanding. Staff brought pt downstairs via wheelchair.  

## 2014-07-18 ENCOUNTER — Encounter: Payer: Self-pay | Admitting: Cardiovascular Disease

## 2014-07-18 ENCOUNTER — Ambulatory Visit (INDEPENDENT_AMBULATORY_CARE_PROVIDER_SITE_OTHER): Payer: PPO | Admitting: Cardiovascular Disease

## 2014-07-18 VITALS — BP 130/80 | HR 77 | Ht 73.0 in | Wt 240.0 lb

## 2014-07-18 DIAGNOSIS — I5032 Chronic diastolic (congestive) heart failure: Secondary | ICD-10-CM

## 2014-07-18 DIAGNOSIS — I10 Essential (primary) hypertension: Secondary | ICD-10-CM

## 2014-07-18 DIAGNOSIS — I25810 Atherosclerosis of coronary artery bypass graft(s) without angina pectoris: Secondary | ICD-10-CM

## 2014-07-18 NOTE — Progress Notes (Signed)
History of Present Illness: 74 y.o. male with history of CAD s/p 5V CABG March 2014, DM, HTN, HLD, former tobacco abuse who is here today for cardiac follow up. He was admitted to [facility] 3/26-08/31/12 with a NSTEMI. Cardiac cath 08/23/12 demonstrated severe 3 vessel CAD with preserved LV function. Echo 08/24/12: Mild LVH, EF 65-70%, mild RVE, PASP 36. Pre-op dopplers with no carotid stenosis. 5V CABG 08/25/12 (LIMA-LAD, SVG-DX, SVG-OM1/OM2, SVG-PDA). Echo 02/28/13 with normal LV size and function, LVEF=55-60%. Metoprolol was stopped October 2014 due to fatigue. I saw him 06/04/13 and he had c/o right sided chest pain and jaw pain. This occurred after a long weekend of manual labor building a deck. No recurrence since then. I arranged a stress myoview on 06/20/13 which showed no ischemia. He exercised for 7 minutes without chest pain.   He is here today for follow up. He denies chest pain or SOB. Back surgery 05/27/14 and slowly recovering from this.     Primary Care Physician: [name]  Last Lipid Profile:Lipid Panel     Component Value Date/Time   CHOL 107 10/31/2012 0821   TRIG 103.0 10/31/2012 0821   HDL 35.60* 10/31/2012 0821   CHOLHDL 3 10/31/2012 0821   VLDL 20.6 10/31/2012 0821   LDLCALC 51 10/31/2012 0821    Past Medical History  Diagnosis Date  . Diabetes mellitus without complication     a. Dx in 2010  . Hypertension     a. Dx in 2010  . Back pain, chronic   . DJD (degenerative joint disease)   . History of tobacco abuse     a. 57 year hx, quit 03/2012.  [name] Chronic pain   . Fibromyalgia   . CAD (coronary artery disease)     a. s/p NSTEMI 07/2012 => LHC with 3v CAD => s/p CABG 08/25/12 (LIMA-LAD, SVG-DX, SVG-OM1/OM2, SVG-PDA) Dr. Tyrone Sage;  b.  Echo 08/24/12:  Mild LVH, EF 65-70%, mild RVE, PASP 36  . Chronic back pain   . HLD (hyperlipidemia)   . Shortness of breath dyspnea     with extertion  . GERD (gastroesophageal reflux disease)   . Complication of anesthesia     hard  to wake up  . Sleep apnea   . Osteoarthritis   . Depression   . Sleep apnea   . Fatty liver     Past Surgical History  Procedure Laterality Date  . Right ankle surgery    . Cholecystectomy    . Bilateral rotator cuff repair    . Transurethral resection of prostate    . Vocal cord tumor resection    . Parotid gland resection- non cancerous Left     parotid gland resection-non cancerous  . Coronary artery bypass graft N/A 08/25/2012    Procedure: CORONARY ARTERY BYPASS GRAFTING (CABG);  Surgeon: Delight Ovens, MD;  Location: [facility] OR;  Service: Open Heart Surgery;  Laterality: N/A;  Coronary artery bypass graft times five using left internal mammary artery and bilateral saphenous leg vein using endoscope.  . Intraoperative transesophageal echocardiogram N/A 08/25/2012    Procedure: INTRAOPERATIVE TRANSESOPHAGEAL ECHOCARDIOGRAM;  Surgeon: Delight Ovens, MD;  Location: [facility] OR;  Service: Open Heart Surgery;  Laterality: N/A;  . Left heart catheterization with coronary angiogram N/A 08/23/2012    Procedure: LEFT HEART CATHETERIZATION WITH CORONARY ANGIOGRAM;  Surgeon: Kathleene Hazel, MD;  Location: [facility] CATH LAB;  Service: Cardiovascular;  Laterality: N/A;  . Shoulder surgery Bilateral   . Nasal sinus  surgery      Current Outpatient Prescriptions  Medication Sig Dispense Refill  . aspirin EC 325 MG EC tablet Take 1 tablet (325 mg total) by mouth daily. 30 tablet   . atorvastatin (LIPITOR) 80 MG tablet TAKE 1 TABLET BY MOUTH EVERY DAY AT 6 PM 30 tablet 6  . buprenorphine (BUTRANS - DOSED MCG/HR) 10 MCG/HR PTWK Place 10 mcg onto the skin once a week.    [name] buPROPion (WELLBUTRIN SR) 150 MG 12 hr tablet Take 150 mg by mouth daily.    . cholecalciferol (VITAMIN D) 1000 UNITS tablet Take 1,000 Units by mouth daily.    . clonazePAM (KLONOPIN) 1 MG tablet Take 1 mg by mouth at bedtime. 1 tab nightly    . Coenzyme Q10 (COQ10) 100 MG CAPS Take 100 mg by mouth daily.    [name] dexamethasone  (DECADRON) 0.75 MG tablet 2 tablets twice daily for 2 days, one tablet twice daily for 2 days, one tablet daily for 2 days. 15 tablet 0  . furosemide (LASIX) 40 MG tablet Take 1 tablet (40 mg total) by mouth 2 (two) times daily. (Patient taking differently: Take 40 mg by mouth daily. ) 180 tablet 2  . metFORMIN (GLUCOPHAGE) 500 MG tablet Take 500 mg by mouth 2 (two) times daily with a meal.     . pantoprazole (PROTONIX) 40 MG tablet Take 40 mg by mouth daily.     . potassium chloride SA (K-DUR,KLOR-CON) 20 MEQ tablet TAKE 2 TABLETS BY MOUTH EVERY DAY. 60 tablet 0  . sennosides-docusate sodium (SENOKOT-S) 8.6-50 MG tablet Take 1 tablet by mouth 2 (two) times daily.    . temazepam (RESTORIL) 15 MG capsule Take 15 mg by mouth at bedtime as needed for sleep.    . Vilazodone HCl (VIIBRYD) 40 MG TABS Take 40 mg by mouth daily.    . methocarbamol (ROBAXIN) 500 MG tablet Take 1 tablet (500 mg total) by mouth every 6 (six) hours as needed for muscle spasms. (Patient not taking: Reported on 07/18/2014) 60 tablet 3  . nitroGLYCERIN (NITROSTAT) 0.4 MG SL tablet Place 1 tablet (0.4 mg total) under the tongue every 5 (five) minutes as needed for chest pain. (Patient not taking: Reported on 07/18/2014) 25 tablet 6  . oxycodone (OXY-IR) 5 MG capsule Take 5 mg by mouth every 4 (four) hours as needed.    . traMADol (ULTRAM) 50 MG tablet Take 50 mg by mouth every 12 (twelve) hours as needed for moderate pain.   0   No current facility-administered medications for this visit.    Allergies  Allergen Reactions  . Morphine And Related Nausea Only    History   Social History  . Marital Status: Married    Spouse Name: N/A  . Number of Children: N/A  . Years of Education: N/A   Occupational History  . Not on file.   Social History Main Topics  . Smoking status: Former Smoker -- 1.00 packs/day for 57 years    Types: Cigarettes  . Smokeless tobacco: Former Neurosurgeon    Types: Chew     Comment: smoked from the age  of 40.  Quit 03/2012  . Alcohol Use: Yes     Comment: rare  . Drug Use: No  . Sexual Activity: Not on file   Other Topics Concern  . Not on file   Social History Narrative   Lives in [place] with wife.  Retired.    Family History  Problem Relation Age of  Onset  . Heart attack Father     died @ 5759  . Lung cancer Mother     died @ 2685  . Other Sister     A&W in her 770's  . Bladder Cancer Brother     alive    Review of Systems:  As stated in the HPI and otherwise negative.   BP 130/80 mmHg  Pulse 77  Ht 6\' 1"  (1.854 m)  Wt 240 lb (108.863 kg)  BMI 31.67 kg/m2  Physical Examination: General: Well developed, well nourished, NAD HEENT: OP clear, mucus membranes moist SKIN: warm, dry. No rashes. Neuro: No focal deficits Musculoskeletal: Muscle strength 5/5 all ext Psychiatric: Mood and affect normal Neck: No JVD, no carotid bruits, no thyromegaly, no lymphadenopathy. Lungs:Clear bilaterally, no wheezes, rhonci, crackles Cardiovascular: Regular rate and rhythm. No murmurs, gallops or rubs. Abdomen:Soft. Bowel sounds present. Non-tender.  Extremities: No lower extremity edema. Pulses are 2 + in the bilateral DP/PT.  Exercise stress myoview 06/20/13: Stress Procedure: The patient exercised on the treadmill utilizing the Bruce Protocol for 7:00 minutes. The patient stopped due to fatigue and denied any chest pain. Technetium 2034m Sestamibi was injected at peak exercise and myocardial perfusion imaging was performed after a brief delay.  Stress ECG: No significant change from baseline ECG  QPS  Raw Data Images: Normal; no motion artifact; normal heart/lung ratio.  Stress Images: Normal homogeneous uptake in all areas of the myocardium.  Rest Images: Normal homogeneous uptake in all areas of the myocardium.  Subtraction (SDS): No evidence of ischemia.  Transient Ischemic Dilatation (Normal <1.22): 0.95  Lung/Heart Ratio (Normal <0.45): 0.34  Quantitative Gated Spect Images  QGS  EDV: 114 ml  QGS ESV: 52 ml  Impression  Exercise Capacity: Good exercise capacity.  BP Response: Normal blood pressure response.  Clinical Symptoms: No symptoms.  ECG Impression: No significant ST segment change suggestive of ischemia.  Comparison with Prior Nuclear Study: No images to compare  Overall Impression: Normal stress nuclear study.  LV Ejection Fraction: 54%. LV Wall Motion: NL LV Function; NL Wall Motion   Assessment and Plan:   1. CAD: Stable. Stress test 06/20/13 without ischemia. He is on an ASA, statin. He has been off of beta blocker due to fatigue.   2. Chronic diastolic CHF: LE edema is stable. Continue Lasix 40 mg po Qdaily.   3. HTN: BP stable. No changes  4. Hyperlipidemia: On statin. No changes

## 2014-07-18 NOTE — Patient Instructions (Signed)
Your physician wants you to follow-up in:  6 months. You will receive a reminder letter in the mail two months in advance. If you don't receive a letter, please call our office to schedule the follow-up appointment.   

## 2014-07-23 ENCOUNTER — Other Ambulatory Visit: Payer: Self-pay | Admitting: Cardiovascular Disease

## 2015-01-25 ENCOUNTER — Other Ambulatory Visit: Payer: Self-pay | Admitting: Cardiovascular Disease

## 2015-02-07 ENCOUNTER — Other Ambulatory Visit: Payer: Self-pay | Admitting: Cardiovascular Disease

## 2015-02-07 NOTE — Telephone Encounter (Signed)
Should this be qd or bid? Please advise. Thanks, MI 

## 2015-03-12 ENCOUNTER — Ambulatory Visit: Payer: Self-pay | Admitting: Cardiovascular Disease

## 2015-03-17 ENCOUNTER — Ambulatory Visit (INDEPENDENT_AMBULATORY_CARE_PROVIDER_SITE_OTHER): Payer: PPO | Admitting: Cardiovascular Disease

## 2015-03-17 ENCOUNTER — Encounter: Payer: Self-pay | Admitting: Cardiovascular Disease

## 2015-03-17 ENCOUNTER — Telehealth: Payer: Self-pay

## 2015-03-17 VITALS — BP 106/68 | HR 64 | Ht 73.0 in | Wt 221.0 lb

## 2015-03-17 DIAGNOSIS — G4700 Insomnia, unspecified: Secondary | ICD-10-CM

## 2015-03-17 DIAGNOSIS — E785 Hyperlipidemia, unspecified: Secondary | ICD-10-CM

## 2015-03-17 DIAGNOSIS — I25810 Atherosclerosis of coronary artery bypass graft(s) without angina pectoris: Secondary | ICD-10-CM | POA: Diagnosis not present

## 2015-03-17 DIAGNOSIS — I10 Essential (primary) hypertension: Secondary | ICD-10-CM | POA: Diagnosis not present

## 2015-03-17 DIAGNOSIS — I5032 Chronic diastolic (congestive) heart failure: Secondary | ICD-10-CM

## 2015-03-17 MED ORDER — ZOLPIDEM TARTRATE 5 MG PO TABS: 5.0000 mg | ORAL_TABLET | Freq: Every evening | ORAL | Status: DC | PRN

## 2015-03-17 MED ORDER — ASPIRIN EC 81 MG PO TBEC: 81.0000 mg | DELAYED_RELEASE_TABLET | Freq: Every day | ORAL | Status: AC

## 2015-03-17 NOTE — Patient Instructions (Addendum)
Medication Instructions:  Your physician has recommended you make the following change in your medication: Decrease aspirin to 81 mg by mouth daily     Labwork: none  Testing/Procedures: none  Follow-Up: Your physician wants you to follow-up in: 6 months.  You will receive a reminder letter in the mail two months in advance. If you don't receive a letter, please call our office to schedule the follow-up appointment.   Any Other Special Instructions Will Be Listed Below (If Applicable).

## 2015-03-17 NOTE — Telephone Encounter (Signed)
[facility]

## 2015-03-17 NOTE — Progress Notes (Signed)
Chief Complaint  Patient presents with  . Shortness of Breath     History of Present Illness: 74 y.o. male with history of CAD s/p 5V CABG March 2014, DM, HTN, HLD, former tobacco abuse, OSA who is here today for cardiac follow up. He was admitted to [facility] 3/26-08/31/12 with a NSTEMI. Cardiac cath 08/23/12 demonstrated severe 3 vessel CAD with preserved LV function. Echo 08/24/12: Mild LVH, EF 65-70%, mild RVE, PASP 36. Pre-op dopplers with no carotid stenosis. 5V CABG 08/25/12 (LIMA-LAD, SVG-DX, SVG-OM1/OM2, SVG-PDA). Echo 02/28/13 with normal LV size and function, LVEF=55-60%. Metoprolol was stopped October 2014 due to fatigue. I saw him 06/04/13 and he had c/o right sided chest pain and jaw pain. This occurred after a long weekend of manual labor building a deck. No recurrence since then. I arranged a stress myoview on 06/20/13 which showed no ischemia. He exercised for 7 minutes without chest pain.   He is here today for follow up. He denies chest pain. He has mild dyspnea when climbing hills. Excessive sweating. He has been using his CPAP nightly. Reports trouble sleeping and has no benefit from Klonopin.   Primary Care Physician: [name]  Past Medical History  Diagnosis Date  . Diabetes mellitus without complication (HCC)     a. Dx in 2010  . Hypertension     a. Dx in 2010  . Back pain, chronic   . DJD (degenerative joint disease)   . History of tobacco abuse     a. 57 year hx, quit 03/2012.  [name] Chronic pain   . Fibromyalgia   . CAD (coronary artery disease)     a. s/p NSTEMI 07/2012 => LHC with 3v CAD => s/p CABG 08/25/12 (LIMA-LAD, SVG-DX, SVG-OM1/OM2, SVG-PDA) Dr. Tyrone Sage;  b.  Echo 08/24/12:  Mild LVH, EF 65-70%, mild RVE, PASP 36  . Chronic back pain   . HLD (hyperlipidemia)   . Shortness of breath dyspnea     with extertion  . GERD (gastroesophageal reflux disease)   . Complication of anesthesia     hard to wake up  . Sleep apnea   . Osteoarthritis   . Depression   .  Sleep apnea   . Fatty liver     Past Surgical History  Procedure Laterality Date  . Right ankle surgery    . Cholecystectomy    . Bilateral rotator cuff repair    . Transurethral resection of prostate    . Vocal cord tumor resection    . Parotid gland resection- non cancerous Left     parotid gland resection-non cancerous  . Coronary artery bypass graft N/A 08/25/2012    Procedure: CORONARY ARTERY BYPASS GRAFTING (CABG);  Surgeon: Delight Ovens, MD;  Location: [facility] OR;  Service: Open Heart Surgery;  Laterality: N/A;  Coronary artery bypass graft times five using left internal mammary artery and bilateral saphenous leg vein using endoscope.  . Intraoperative transesophageal echocardiogram N/A 08/25/2012    Procedure: INTRAOPERATIVE TRANSESOPHAGEAL ECHOCARDIOGRAM;  Surgeon: Delight Ovens, MD;  Location: [facility] OR;  Service: Open Heart Surgery;  Laterality: N/A;  . Left heart catheterization with coronary angiogram N/A 08/23/2012    Procedure: LEFT HEART CATHETERIZATION WITH CORONARY ANGIOGRAM;  Surgeon: Kathleene Hazel, MD;  Location: [facility] CATH LAB;  Service: Cardiovascular;  Laterality: N/A;  . Shoulder surgery Bilateral   . Nasal sinus surgery      Current Outpatient Prescriptions  Medication Sig Dispense Refill  . aspirin 81 MG tablet Take  1 tablet (81 mg total) by mouth daily.    [name] atorvastatin (LIPITOR) 80 MG tablet TAKE 1 TABLET BY MOUTH EVERY DAY AT 6 PM 30 tablet 6  . buprenorphine (BUTRANS - DOSED MCG/HR) 10 MCG/HR PTWK Place 10 mcg onto the skin once a week.    [name] buPROPion (WELLBUTRIN SR) 150 MG 12 hr tablet Take 150 mg by mouth daily.    . cholecalciferol (VITAMIN D) 1000 UNITS tablet Take 1,000 Units by mouth daily.    . clonazePAM (KLONOPIN) 1 MG tablet Take 1 mg by mouth at bedtime. 1 tab nightly    . Coenzyme Q10 (COQ10) 100 MG CAPS Take 100 mg by mouth daily.    [name] dexamethasone (DECADRON) 0.75 MG tablet 2 tablets twice daily for 2 days, one tablet twice daily for 2  days, one tablet daily for 2 days. 15 tablet 0  . furosemide (LASIX) 40 MG tablet TAKE 1 TABLET BY MOUTH TWICE DAILY 180 tablet 0  . metFORMIN (GLUCOPHAGE) 500 MG tablet Take 500 mg by mouth 2 (two) times daily with a meal.     . methocarbamol (ROBAXIN) 500 MG tablet Take 1 tablet (500 mg total) by mouth every 6 (six) hours as needed for muscle spasms. 60 tablet 3  . nitroGLYCERIN (NITROSTAT) 0.4 MG SL tablet Place 1 tablet (0.4 mg total) under the tongue every 5 (five) minutes as needed for chest pain. 25 tablet 6  . oxycodone (OXY-IR) 5 MG capsule Take 5 mg by mouth every 4 (four) hours as needed.    . pantoprazole (PROTONIX) 40 MG tablet Take 40 mg by mouth daily.     . potassium chloride SA (K-DUR,KLOR-CON) 20 MEQ tablet TAKE 2 TABLETS BY MOUTH EVERY DAY 180 tablet 1  . potassium chloride SA (K-DUR,KLOR-CON) 20 MEQ tablet TAKE 2 TABLETS BY MOUTH EVERY DAY 60 tablet 1  . sennosides-docusate sodium (SENOKOT-S) 8.6-50 MG tablet Take 1 tablet by mouth 2 (two) times daily.    . temazepam (RESTORIL) 15 MG capsule Take 15 mg by mouth at bedtime as needed for sleep.    . traMADol (ULTRAM) 50 MG tablet Take 50 mg by mouth every 12 (twelve) hours as needed for moderate pain.   0  . Vilazodone HCl (VIIBRYD) 40 MG TABS Take 40 mg by mouth daily.    [name] zolpidem (AMBIEN) 5 MG tablet Take 1 tablet (5 mg total) by mouth at bedtime as needed for sleep. 30 tablet 3   No current facility-administered medications for this visit.    Allergies  Allergen Reactions  . Morphine And Related Nausea Only    Social History   Social History  . Marital Status: Married    Spouse Name: N/A  . Number of Children: N/A  . Years of Education: N/A   Occupational History  . Not on file.   Social History Main Topics  . Smoking status: Former Smoker -- 1.00 packs/day for 57 years    Types: Cigarettes  . Smokeless tobacco: Former Neurosurgeon    Types: Chew     Comment: smoked from the age of 89.  Quit 03/2012  . Alcohol  Use: Yes     Comment: rare  . Drug Use: No  . Sexual Activity: Not on file   Other Topics Concern  . Not on file   Social History Narrative   Lives in [place] with wife.  Retired.    Family History  Problem Relation Age of Onset  . Heart attack Father  died @ 4159  . Lung cancer Mother     died @ 5085  . Other Sister     A&W in her 6470's  . Bladder Cancer Brother     alive    Review of Systems:  As stated in the HPI and otherwise negative.   BP 106/68 mmHg  Pulse 64  Ht 6\' 1"  (1.854 m)  Wt 221 lb (100.245 kg)  BMI 29.16 kg/m2  SpO2 97%  Physical Examination: General: Well developed, well nourished, NAD HEENT: OP clear, mucus membranes moist SKIN: warm, dry. No rashes. Neuro: No focal deficits Musculoskeletal: Muscle strength 5/5 all ext Psychiatric: Mood and affect normal Neck: No JVD, no carotid bruits, no thyromegaly, no lymphadenopathy. Lungs:Clear bilaterally, no wheezes, rhonci, crackles Cardiovascular: Regular rate and rhythm. No murmurs, gallops or rubs. Abdomen:Soft. Bowel sounds present. Non-tender.  Extremities: No lower extremity edema. Pulses are 2 + in the bilateral DP/PT.  Exercise stress myoview 06/20/13: Stress Procedure: The patient exercised on the treadmill utilizing the Bruce Protocol for 7:00 minutes. The patient stopped due to fatigue and denied any chest pain. Technetium 5943m Sestamibi was injected at peak exercise and myocardial perfusion imaging was performed after a brief delay.  Stress ECG: No significant change from baseline ECG  QPS  Raw Data Images: Normal; no motion artifact; normal heart/lung ratio.  Stress Images: Normal homogeneous uptake in all areas of the myocardium.  Rest Images: Normal homogeneous uptake in all areas of the myocardium.  Subtraction (SDS): No evidence of ischemia.  Transient Ischemic Dilatation (Normal <1.22): 0.95  Lung/Heart Ratio (Normal <0.45): 0.34  Quantitative Gated Spect Images  QGS EDV: 114 ml  QGS ESV:  52 ml  Impression  Exercise Capacity: Good exercise capacity.  BP Response: Normal blood pressure response.  Clinical Symptoms: No symptoms.  ECG Impression: No significant ST segment change suggestive of ischemia.  Comparison with Prior Nuclear Study: No images to compare  Overall Impression: Normal stress nuclear study.  LV Ejection Fraction: 54%. LV Wall Motion: NL LV Function; NL Wall Motion   EKG:  EKG is ordered today. The ekg ordered today demonstrates NSR, rate 64 bpm. Old inferior infarct  Recent Labs: 05/28/2014: BUN 15; Creatinine, Ser 1.12; Hemoglobin 12.3*; Platelets 197; Potassium 4.1; Sodium 137     Wt Readings from Last 3 Encounters:  03/17/15 221 lb (100.245 kg)  07/18/14 240 lb (108.863 kg)  05/27/14 240 lb (108.863 kg)     Other studies Reviewed: Additional studies/ records that were reviewed today include: . Review of the above records demonstrates:    Assessment and Plan:   1. CAD: Stable. Stress test 06/20/13 without ischemia. He is on an ASA, statin. He has been off of beta blocker due to fatigue. Will lower ASA to 81 mg daily.   2. Chronic diastolic CHF: LE edema is stable. Continue Lasix 40 mg po Qdaily.   3. HTN: BP stable. No changes  4. Hyperlipidemia: On statin. Lipids followed in primary care.   5. Insomnia: will start Ambien 5 mg daily  Current medicines are reviewed at length with the patient today.  The patient does not have concerns regarding medicines.  The following changes have been made:  no change  Labs/ tests ordered today include:  No orders of the defined types were placed in this encounter.    Disposition:   FU with me in 6 months  Signed, Verne Carrowhristopher Maijor Hornig, MD 03/17/2015 12:14 PM    [facility] [address], [place], [place]  [address] Phone: [phone]; Fax: [phone]

## 2015-03-18 ENCOUNTER — Telehealth: Payer: Self-pay

## 2015-03-18 NOTE — Telephone Encounter (Signed)
Prior auth for Zolpidem 5 mg obtained, but only through 05/31/2015. Pharmacy notified.

## 2015-06-15 IMAGING — CR DG LUMBAR SPINE 1V
1 series · 1 of 1 positions shown · non-contrast
Comparison: MRI 05/13/2014.

CLINICAL DATA: L4-L5 posterior lumbar interbody fusion.

EXAM:
LUMBAR SPINE - 1 VIEW

[xtable lateral]
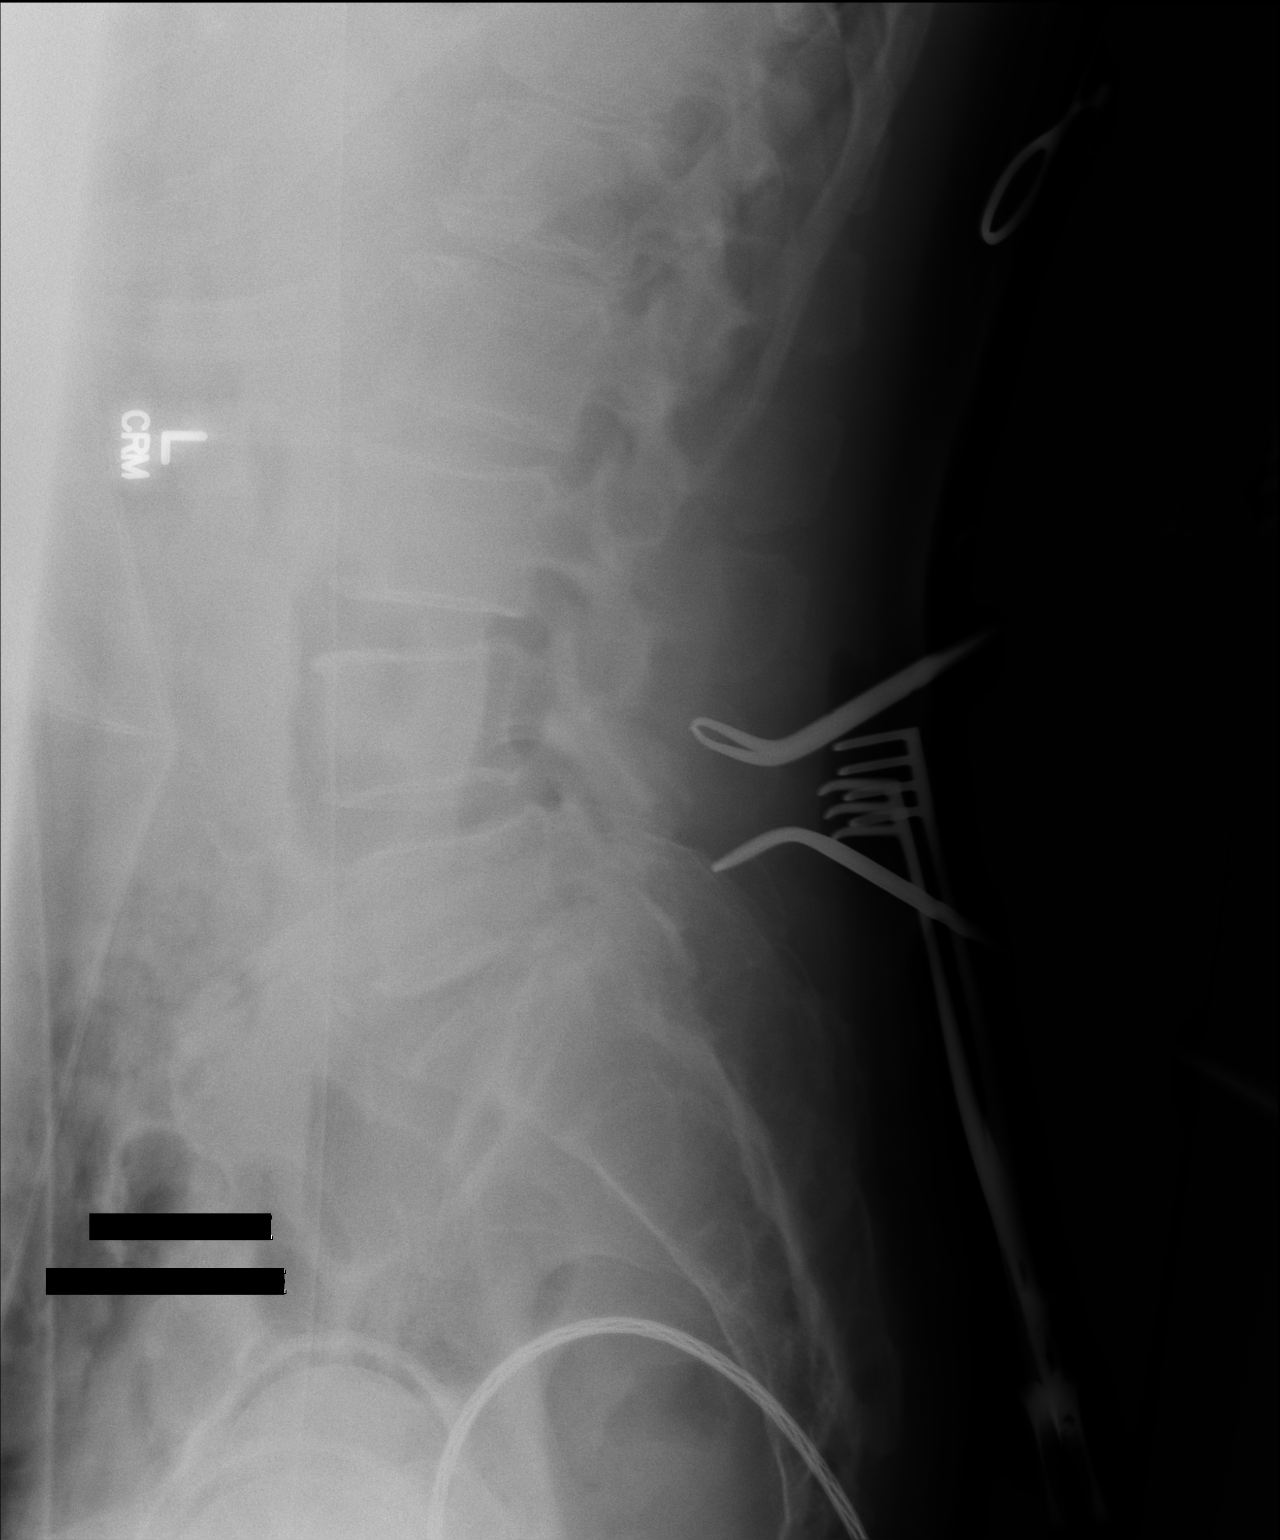

[1 of 1 positions shown; findings below may reference images not displayed]

FINDINGS: Soft tissue retractors are present dorsal to the L4-L5 interspace.
There appear to be clamps on the L4 and L5 spinous processes. Single
lateral cross-table radiograph is submitted for interpretation.
IMPRESSION: Intraoperative localization at L4-L5.

## 2015-06-20 ENCOUNTER — Encounter (HOSPITAL_BASED_OUTPATIENT_CLINIC_OR_DEPARTMENT_OTHER): Payer: Self-pay | Admitting: *Deleted

## 2015-06-20 ENCOUNTER — Other Ambulatory Visit: Payer: Self-pay | Admitting: Orthopedic Surgery

## 2015-06-20 DIAGNOSIS — M65342 Trigger finger, left ring finger: Secondary | ICD-10-CM | POA: Diagnosis not present

## 2015-06-20 DIAGNOSIS — M65331 Trigger finger, right middle finger: Secondary | ICD-10-CM | POA: Diagnosis not present

## 2015-06-20 DIAGNOSIS — M65321 Trigger finger, right index finger: Secondary | ICD-10-CM | POA: Diagnosis not present

## 2015-06-20 DIAGNOSIS — M19041 Primary osteoarthritis, right hand: Secondary | ICD-10-CM | POA: Diagnosis not present

## 2015-06-20 DIAGNOSIS — M65332 Trigger finger, left middle finger: Secondary | ICD-10-CM | POA: Diagnosis not present

## 2015-06-20 DIAGNOSIS — M19042 Primary osteoarthritis, left hand: Secondary | ICD-10-CM | POA: Diagnosis not present

## 2015-06-20 DIAGNOSIS — M151 Heberden's nodes (with arthropathy): Secondary | ICD-10-CM | POA: Diagnosis not present

## 2015-06-20 DIAGNOSIS — M65341 Trigger finger, right ring finger: Secondary | ICD-10-CM | POA: Diagnosis not present

## 2015-06-20 DIAGNOSIS — R52 Pain, unspecified: Secondary | ICD-10-CM | POA: Diagnosis not present

## 2015-06-24 ENCOUNTER — Ambulatory Visit (HOSPITAL_BASED_OUTPATIENT_CLINIC_OR_DEPARTMENT_OTHER): Payer: PPO | Admitting: Anesthesiology

## 2015-06-24 ENCOUNTER — Ambulatory Visit (HOSPITAL_BASED_OUTPATIENT_CLINIC_OR_DEPARTMENT_OTHER)
Admission: RE | Admit: 2015-06-24 | Discharge: 2015-06-24 | Disposition: A | Discharge status: Home / Self Care | Payer: PPO | Source: Ambulatory Visit | Attending: Orthopedic Surgery | Admitting: Orthopedic Surgery

## 2015-06-24 ENCOUNTER — Encounter (HOSPITAL_BASED_OUTPATIENT_CLINIC_OR_DEPARTMENT_OTHER)
Admission: RE | Disposition: A | Discharge status: Home / Self Care | Payer: Self-pay | Source: Ambulatory Visit | Attending: Orthopedic Surgery

## 2015-06-24 ENCOUNTER — Encounter (HOSPITAL_BASED_OUTPATIENT_CLINIC_OR_DEPARTMENT_OTHER): Payer: Self-pay | Admitting: Anesthesiology

## 2015-06-24 DIAGNOSIS — I10 Essential (primary) hypertension: Secondary | ICD-10-CM | POA: Diagnosis not present

## 2015-06-24 DIAGNOSIS — Z7982 Long term (current) use of aspirin: Secondary | ICD-10-CM | POA: Insufficient documentation

## 2015-06-24 DIAGNOSIS — M549 Dorsalgia, unspecified: Secondary | ICD-10-CM | POA: Insufficient documentation

## 2015-06-24 DIAGNOSIS — Z87891 Personal history of nicotine dependence: Secondary | ICD-10-CM | POA: Insufficient documentation

## 2015-06-24 DIAGNOSIS — E119 Type 2 diabetes mellitus without complications: Secondary | ICD-10-CM | POA: Diagnosis not present

## 2015-06-24 DIAGNOSIS — I252 Old myocardial infarction: Secondary | ICD-10-CM | POA: Insufficient documentation

## 2015-06-24 DIAGNOSIS — Z7984 Long term (current) use of oral hypoglycemic drugs: Secondary | ICD-10-CM | POA: Insufficient documentation

## 2015-06-24 DIAGNOSIS — G4730 Sleep apnea, unspecified: Secondary | ICD-10-CM | POA: Diagnosis not present

## 2015-06-24 DIAGNOSIS — M797 Fibromyalgia: Secondary | ICD-10-CM | POA: Diagnosis not present

## 2015-06-24 DIAGNOSIS — I2510 Atherosclerotic heart disease of native coronary artery without angina pectoris: Secondary | ICD-10-CM | POA: Diagnosis not present

## 2015-06-24 DIAGNOSIS — M65332 Trigger finger, left middle finger: Secondary | ICD-10-CM | POA: Insufficient documentation

## 2015-06-24 DIAGNOSIS — K219 Gastro-esophageal reflux disease without esophagitis: Secondary | ICD-10-CM | POA: Insufficient documentation

## 2015-06-24 DIAGNOSIS — Z951 Presence of aortocoronary bypass graft: Secondary | ICD-10-CM | POA: Insufficient documentation

## 2015-06-24 DIAGNOSIS — E785 Hyperlipidemia, unspecified: Secondary | ICD-10-CM | POA: Diagnosis not present

## 2015-06-24 DIAGNOSIS — G8929 Other chronic pain: Secondary | ICD-10-CM | POA: Insufficient documentation

## 2015-06-24 DIAGNOSIS — Z79899 Other long term (current) drug therapy: Secondary | ICD-10-CM | POA: Diagnosis not present

## 2015-06-24 DIAGNOSIS — M1990 Unspecified osteoarthritis, unspecified site: Secondary | ICD-10-CM | POA: Diagnosis not present

## 2015-06-24 DIAGNOSIS — M65342 Trigger finger, left ring finger: Secondary | ICD-10-CM | POA: Diagnosis not present

## 2015-06-24 HISTORY — PX: TRIGGER FINGER RELEASE: SHX641

## 2015-06-24 LAB — GLUCOSE, CAPILLARY: GLUCOSE-CAPILLARY: 102 mg/dL — AB (ref 65–99)

## 2015-06-24 SURGERY — RELEASE, A1 PULLEY, FOR TRIGGER FINGER
Anesthesia: General | Site: Finger | Laterality: Left

## 2015-06-24 MED ORDER — CHLORHEXIDINE GLUCONATE 4 % EX LIQD: 60.0000 mL | Freq: Once | CUTANEOUS | Status: DC

## 2015-06-24 MED ORDER — LACTATED RINGERS IV SOLN
INTRAVENOUS | Status: DC
  Administered 2015-06-24: 10 mL/h via INTRAVENOUS
  Administered 2015-06-24: 08:00:00 via INTRAVENOUS

## 2015-06-24 MED ORDER — PHENYLEPHRINE HCL 10 MG/ML IJ SOLN
INTRAMUSCULAR | Status: DC | PRN
  Administered 2015-06-24 (×2): 40 ug via INTRAVENOUS

## 2015-06-24 MED ORDER — OXYCODONE HCL 5 MG PO TABS: 5.0000 mg | ORAL_TABLET | Freq: Once | ORAL | Status: DC | PRN

## 2015-06-24 MED ORDER — MIDAZOLAM HCL 5 MG/5ML IJ SOLN
INTRAMUSCULAR | Status: DC | PRN
  Administered 2015-06-24: 2 mg via INTRAVENOUS

## 2015-06-24 MED ORDER — FENTANYL CITRATE (PF) 100 MCG/2ML IJ SOLN
INTRAMUSCULAR | Status: AC
  Filled 2015-06-24: qty 2

## 2015-06-24 MED ORDER — TRAMADOL HCL 50 MG PO TABS: 50.0000 mg | ORAL_TABLET | Freq: Four times a day (QID) | ORAL | Status: DC | PRN

## 2015-06-24 MED ORDER — LIDOCAINE HCL (PF) 0.5 % IJ SOLN
INTRAMUSCULAR | Status: DC | PRN
  Administered 2015-06-24: 30 mL via INTRAVENOUS

## 2015-06-24 MED ORDER — DEXAMETHASONE SODIUM PHOSPHATE 10 MG/ML IJ SOLN
INTRAMUSCULAR | Status: AC
  Filled 2015-06-24: qty 1

## 2015-06-24 MED ORDER — FENTANYL CITRATE (PF) 100 MCG/2ML IJ SOLN: 50.0000 ug | INTRAMUSCULAR | Status: DC | PRN

## 2015-06-24 MED ORDER — HYDROMORPHONE HCL 1 MG/ML IJ SOLN: 0.2500 mg | INTRAMUSCULAR | Status: DC | PRN

## 2015-06-24 MED ORDER — GLYCOPYRROLATE 0.2 MG/ML IJ SOLN: 0.2000 mg | Freq: Once | INTRAMUSCULAR | Status: DC | PRN

## 2015-06-24 MED ORDER — PROPOFOL 500 MG/50ML IV EMUL
INTRAVENOUS | Status: AC
Start: 2015-06-24 — End: 2015-06-24
  Filled 2015-06-24: qty 50

## 2015-06-24 MED ORDER — OXYCODONE HCL 5 MG/5ML PO SOLN: 5.0000 mg | Freq: Once | ORAL | Status: DC | PRN

## 2015-06-24 MED ORDER — MIDAZOLAM HCL 2 MG/2ML IJ SOLN: 1.0000 mg | INTRAMUSCULAR | Status: DC | PRN

## 2015-06-24 MED ORDER — BUPIVACAINE HCL (PF) 0.25 % IJ SOLN
INTRAMUSCULAR | Status: DC | PRN
  Administered 2015-06-24: 10 mL

## 2015-06-24 MED ORDER — CEFAZOLIN SODIUM-DEXTROSE 2-3 GM-% IV SOLR
INTRAVENOUS | Status: AC
  Filled 2015-06-24: qty 50

## 2015-06-24 MED ORDER — LACTATED RINGERS IV SOLN
INTRAVENOUS | Status: DC | PRN
  Administered 2015-06-24 (×2): via INTRAVENOUS

## 2015-06-24 MED ORDER — LIDOCAINE HCL (CARDIAC) 20 MG/ML IV SOLN
INTRAVENOUS | Status: AC
  Filled 2015-06-24: qty 5

## 2015-06-24 MED ORDER — CEFAZOLIN SODIUM-DEXTROSE 2-3 GM-% IV SOLR: 2.0000 g | INTRAVENOUS | Status: DC

## 2015-06-24 MED ORDER — PROPOFOL 500 MG/50ML IV EMUL
INTRAVENOUS | Status: AC
  Filled 2015-06-24: qty 50

## 2015-06-24 MED ORDER — PROPOFOL 500 MG/50ML IV EMUL
INTRAVENOUS | Status: DC | PRN
  Administered 2015-06-24: 25 ug/kg/min via INTRAVENOUS

## 2015-06-24 MED ORDER — CEFAZOLIN SODIUM-DEXTROSE 2-3 GM-% IV SOLR
INTRAVENOUS | Status: DC | PRN
  Administered 2015-06-24: 2 g via INTRAVENOUS

## 2015-06-24 MED ORDER — SCOPOLAMINE 1 MG/3DAYS TD PT72: 1.0000 | MEDICATED_PATCH | Freq: Once | TRANSDERMAL | Status: DC

## 2015-06-24 MED ORDER — ONDANSETRON HCL 4 MG/2ML IJ SOLN
INTRAMUSCULAR | Status: AC
  Filled 2015-06-24: qty 2

## 2015-06-24 MED ORDER — BUPIVACAINE HCL (PF) 0.25 % IJ SOLN
INTRAMUSCULAR | Status: AC
  Filled 2015-06-24: qty 30

## 2015-06-24 MED ORDER — MIDAZOLAM HCL 2 MG/2ML IJ SOLN
INTRAMUSCULAR | Status: AC
  Filled 2015-06-24: qty 2

## 2015-06-24 MED ORDER — LIDOCAINE HCL (PF) 1 % IJ SOLN
INTRAMUSCULAR | Status: AC
  Filled 2015-06-24: qty 5

## 2015-06-24 MED ORDER — KETOROLAC TROMETHAMINE 30 MG/ML IJ SOLN
INTRAMUSCULAR | Status: DC | PRN
  Administered 2015-06-24: 30 mg via INTRAVENOUS

## 2015-06-24 MED ORDER — MEPERIDINE HCL 25 MG/ML IJ SOLN: 6.2500 mg | INTRAMUSCULAR | Status: DC | PRN

## 2015-06-24 MED ORDER — KETOROLAC TROMETHAMINE 30 MG/ML IJ SOLN
INTRAMUSCULAR | Status: AC
  Filled 2015-06-24: qty 1

## 2015-06-24 MED ORDER — LIDOCAINE HCL (PF) 0.5 % IJ SOLN
INTRAMUSCULAR | Status: AC
  Filled 2015-06-24: qty 50

## 2015-06-24 SURGICAL SUPPLY — 32 items
BANDAGE COBAN STERILE 2 (GAUZE/BANDAGES/DRESSINGS) ×3 IMPLANT
BLADE SURG 15 STRL LF DISP TIS (BLADE) ×1 IMPLANT
BLADE SURG 15 STRL SS (BLADE) ×2
BNDG ESMARK 4X9 LF (GAUZE/BANDAGES/DRESSINGS) IMPLANT
CHLORAPREP W/TINT 26ML (MISCELLANEOUS) ×3 IMPLANT
CORDS BIPOLAR (ELECTRODE) IMPLANT
COVER BACK TABLE 60X90IN (DRAPES) ×3 IMPLANT
COVER MAYO STAND STRL (DRAPES) ×3 IMPLANT
CUFF TOURNIQUET SINGLE 18IN (TOURNIQUET CUFF) ×3 IMPLANT
DECANTER SPIKE VIAL GLASS SM (MISCELLANEOUS) IMPLANT
DRAPE EXTREMITY T 121X128X90 (DRAPE) ×3 IMPLANT
DRAPE SURG 17X23 STRL (DRAPES) ×3 IMPLANT
GAUZE SPONGE 4X4 12PLY STRL (GAUZE/BANDAGES/DRESSINGS) ×3 IMPLANT
GAUZE XEROFORM 1X8 LF (GAUZE/BANDAGES/DRESSINGS) ×3 IMPLANT
GLOVE BIOGEL PI IND STRL 7.0 (GLOVE) ×1 IMPLANT
GLOVE BIOGEL PI IND STRL 8.5 (GLOVE) ×1 IMPLANT
GLOVE BIOGEL PI INDICATOR 7.0 (GLOVE) ×2
GLOVE BIOGEL PI INDICATOR 8.5 (GLOVE) ×2
GLOVE ECLIPSE 6.5 STRL STRAW (GLOVE) ×3 IMPLANT
GLOVE SURG ORTHO 8.0 STRL STRW (GLOVE) ×3 IMPLANT
GOWN STRL REUS W/ TWL LRG LVL3 (GOWN DISPOSABLE) ×1 IMPLANT
GOWN STRL REUS W/TWL LRG LVL3 (GOWN DISPOSABLE) ×2
GOWN STRL REUS W/TWL XL LVL3 (GOWN DISPOSABLE) ×3 IMPLANT
NEEDLE PRECISIONGLIDE 27X1.5 (NEEDLE) ×3 IMPLANT
NS IRRIG 1000ML POUR BTL (IV SOLUTION) ×3 IMPLANT
PACK BASIN DAY SURGERY FS (CUSTOM PROCEDURE TRAY) ×3 IMPLANT
STOCKINETTE 4X48 STRL (DRAPES) ×3 IMPLANT
SUT ETHILON 4 0 PS 2 18 (SUTURE) ×3 IMPLANT
SYR BULB 3OZ (MISCELLANEOUS) ×3 IMPLANT
SYR CONTROL 10ML LL (SYRINGE) ×3 IMPLANT
TOWEL OR 17X24 6PK STRL BLUE (TOWEL DISPOSABLE) ×6 IMPLANT
UNDERPAD 30X30 (UNDERPADS AND DIAPERS) ×3 IMPLANT

## 2015-06-24 NOTE — H&P (Signed)
Timothy Cervantes is an 75 y.o. male.   Chief Complaint: catching multiple digts HPI: Timothy Cervantes is a 75 year old right hand dominant male referred by Dr. Barnett Abu for consultation with respect to pain in his right hand much greater than his left. This goes across the metacarpophalangeal joints index, middle, and ring right hand with catching of his fingers on his left-sided is on his middle and ring. This has been going on for several months. He recalls no history of injury. He does have a history of diabetes. He states that he uses ice in the morning to mobilize these. He has taken Advil two pills in the morning. He states it feels like a stone bruise. He is not complaining of any numbness or tingling. He also has a laceration of his right small finger which occurred approximately one week ago. His wife is an Charity fundraiser and she Steri-Stripped it closed. He has a history of a crush injury as has his history of injuries to his neck. He has a history of diabetes. No history of thyroid problems. He does have a history of arthritis. No history of gout. FAMILY HISTORY: Negative for diabetes, thyroid problems, arthritis and gout. He has had back surgery by Dr. Danielle Dess who has referred him. His notes are reviewed with respect to his back problems. He has a history of sleep apnea, arthritis, and coronary artery disease.  Past Medical History  Diagnosis Date  . Arthritis  . Coronary artery disease  . Diabetes mellitus type II, controlled (HCC)  . Sleep apnea   Past Surgical History  Procedure Laterality Date  . Coronary artery bypass graft  . Shoulder surgery    Past Medical History  Diagnosis Date  . Diabetes mellitus without complication (HCC)     a. Dx in 2010  . Back pain, chronic   . DJD (degenerative joint disease)   . History of tobacco abuse     a. 57 year hx, quit 03/2012.  [name] Chronic pain   . Fibromyalgia   . CAD (coronary artery disease)     a. s/p NSTEMI 07/2012 => LHC with 3v CAD => s/p  CABG 08/25/12 (LIMA-LAD, SVG-DX, SVG-OM1/OM2, SVG-PDA) Dr. Tyrone Sage;  b.  Echo 08/24/12:  Mild LVH, EF 65-70%, mild RVE, PASP 36  . Chronic back pain   . HLD (hyperlipidemia)   . Shortness of breath dyspnea     with extertion  . GERD (gastroesophageal reflux disease)   . Complication of anesthesia     hard to wake up  . Osteoarthritis   . Depression   . Fatty liver   . Sleep apnea     uses CPAP every night  . Sleep apnea     Past Surgical History  Procedure Laterality Date  . Right ankle surgery    . Cholecystectomy    . Bilateral rotator cuff repair    . Transurethral resection of prostate    . Vocal cord tumor resection    . Parotid gland resection- non cancerous Left     parotid gland resection-non cancerous  . Coronary artery bypass graft N/A 08/25/2012    Procedure: CORONARY ARTERY BYPASS GRAFTING (CABG);  Surgeon: Delight Ovens, MD;  Location: [facility] OR;  Service: Open Heart Surgery;  Laterality: N/A;  Coronary artery bypass graft times five using left internal mammary artery and bilateral saphenous leg vein using endoscope.  . Intraoperative transesophageal echocardiogram N/A 08/25/2012    Procedure: INTRAOPERATIVE TRANSESOPHAGEAL ECHOCARDIOGRAM;  Surgeon: Delight Ovens, MD;  Location: [facility] OR;  Service: Open Heart Surgery;  Laterality: N/A;  . Left heart catheterization with coronary angiogram N/A 08/23/2012    Procedure: LEFT HEART CATHETERIZATION WITH CORONARY ANGIOGRAM;  Surgeon: Kathleene Hazel, MD;  Location: [facility] CATH LAB;  Service: Cardiovascular;  Laterality: N/A;  . Shoulder surgery Bilateral   . Nasal sinus surgery      Family History  Problem Relation Age of Onset  . Heart attack Father     died @ 80  . Lung cancer Mother     died @ 9  . Other Sister     A&W in her 25's  . Bladder Cancer Brother     alive   Social History:  reports that he has quit smoking. His smoking use included Cigarettes. He has a 57 pack-year smoking history. He has quit using  smokeless tobacco. His smokeless tobacco use included Chew. He reports that he drinks alcohol. He reports that he does not use illicit drugs.  Allergies:  Allergies  Allergen Reactions  . Latex     Had reaction to latex foley catheter, but ok with gloves  . Morphine And Related Nausea Only    Medications Prior to Admission  Medication Sig Dispense Refill  . aspirin 81 MG tablet Take 1 tablet (81 mg total) by mouth daily. (Patient taking differently: Take 325 mg by mouth daily. )    . atorvastatin (LIPITOR) 80 MG tablet TAKE 1 TABLET BY MOUTH EVERY DAY AT 6 PM 30 tablet 6  . buprenorphine (BUTRANS - DOSED MCG/HR) 10 MCG/HR PTWK Place 10 mcg onto the skin once a week.    . cholecalciferol (VITAMIN D) 1000 UNITS tablet Take 1,000 Units by mouth daily.    . clonazePAM (KLONOPIN) 1 MG tablet Take 1 mg by mouth at bedtime. 1 tab nightly    . furosemide (LASIX) 40 MG tablet TAKE 1 TABLET BY MOUTH TWICE DAILY 180 tablet 0  . metFORMIN (GLUCOPHAGE) 500 MG tablet Take 500 mg by mouth 2 (two) times daily with a meal.     . potassium chloride SA (K-DUR,KLOR-CON) 20 MEQ tablet TAKE 2 TABLETS BY MOUTH EVERY DAY 180 tablet 1  . sennosides-docusate sodium (SENOKOT-S) 8.6-50 MG tablet Take 1 tablet by mouth 2 (two) times daily.    . temazepam (RESTORIL) 15 MG capsule Take 15 mg by mouth at bedtime as needed for sleep.    . Vilazodone HCl (VIIBRYD) 40 MG TABS Take 40 mg by mouth daily.    . nitroGLYCERIN (NITROSTAT) 0.4 MG SL tablet Place 1 tablet (0.4 mg total) under the tongue every 5 (five) minutes as needed for chest pain. 25 tablet 6  . zolpidem (AMBIEN) 5 MG tablet Take 1 tablet (5 mg total) by mouth at bedtime as needed for sleep. 30 tablet 3    No results found for this or any previous visit (from the past 48 hour(s)).  No results found.   Pertinent items are noted in HPI.  Height  (1.854 m), weight 99.791 kg (220 lb).  General appearance: alert, cooperative and appears stated  age Head: Normocephalic, without obvious abnormality Neck: no JVD Resp: clear to auscultation bilaterally Cardio: regular rate and rhythm, S1, S2 normal, no murmur, click, rub or gallop GI: soft, non-tender; bowel sounds normal; no masses,  no organomegaly Extremities: catching middle and ring fingers left hand Pulses: 2+ and symmetric Skin: Skin color, texture, turgor normal. No rashes or lesions Neurologic: Grossly normal Incision/Wound: na  Assessment/Plan he decided  he would like to undergo release of the trigger fingers on his left middle and ring. The pre and postoperative course were discussed along with risks and complications. He is aware that there is no guarantee with the surgery, possibility of infection, recurrence, injury to arteries, nerves, tendons, dystrophy. He is scheduled for release A1 pulley, left ring finger as an outpatient under regional anesthesia. He does have diabetes.   Zaleigh Bermingham R 06/24/2015, 7:16 AM

## 2015-06-24 NOTE — Anesthesia Preprocedure Evaluation (Addendum)
Anesthesia Evaluation  Patient identified by MRN, date of birth, ID band Patient awake    Reviewed: Allergy & Precautions, NPO status , Patient's Chart, lab work & pertinent test results  Airway Mallampati: I  TM Distance: >3 FB Neck ROM: Full    Dental  (+) Teeth Intact, Dental Advisory Given   Pulmonary sleep apnea and Continuous Positive Airway Pressure Ventilation , former smoker,    breath sounds clear to auscultation       Cardiovascular hypertension, Pt. on medications + CAD and + Past MI   Rhythm:Regular Rate:Normal     Neuro/Psych    GI/Hepatic GERD  Medicated and Controlled,  Endo/Other  diabetes, Well Controlled, Type 2, Oral Hypoglycemic Agents  Renal/GU      Musculoskeletal   Abdominal   Peds  Hematology   Anesthesia Other Findings   Reproductive/Obstetrics                           Anesthesia Physical Anesthesia Plan  ASA: III  Anesthesia Plan: General   Post-op Pain Management:    Induction: Intravenous  Airway Management Planned: Simple Face Mask  Additional Equipment:   Intra-op Plan:   Post-operative Plan:   Informed Consent: I have reviewed the patients History and Physical, chart, labs and discussed the procedure including the risks, benefits and alternatives for the proposed anesthesia with the patient or authorized representative who has indicated his/her understanding and acceptance.   Dental advisory given  Plan Discussed with: CRNA, Anesthesiologist and Surgeon  Anesthesia Plan Comments:         Anesthesia Quick Evaluation

## 2015-06-24 NOTE — Anesthesia Postprocedure Evaluation (Signed)
Anesthesia Post Note  Patient: Timothy Cervantes  Procedure(s) Performed: Procedure(s) (LRB): RELEASE A-1 PULLEY LEFT LONG AND LEFT RING FINGERS (Left)  Patient location during evaluation: PACU Anesthesia Type: General Level of consciousness: awake and alert Pain management: pain level controlled Vital Signs Assessment: post-procedure vital signs reviewed and stable Respiratory status: spontaneous breathing, nonlabored ventilation and respiratory function stable Cardiovascular status: blood pressure returned to baseline and stable Postop Assessment: no signs of nausea or vomiting Anesthetic complications: no    Last Vitals:  Filed Vitals:   06/24/15 1000 06/24/15 1032  BP: 128/82 106/77  Pulse: 73 69  Temp:  36.4 C  Resp: 17 16    Last Pain:  Filed Vitals:   06/24/15 1042  PainSc: 0-No pain                 Lorie Melichar A

## 2015-06-24 NOTE — Op Note (Signed)
Dictation Number [id number]

## 2015-06-24 NOTE — Discharge Instructions (Addendum)

## 2015-06-24 NOTE — OR Nursing (Signed)
Patient states he is okay with latex gloves.  He wears them everyday.  He had problems in the past with a foley catheter, but not gloves.

## 2015-06-24 NOTE — Brief Op Note (Signed)
06/24/2015  9:19 AM  PATIENT:  Timothy Cervantes  75 y.o. male  PRE-OPERATIVE DIAGNOSIS:  stenosing tenosynovitis left long and left ring fingers  M65.332 and M65.342  POST-OPERATIVE DIAGNOSIS:  stenosing tenosynovitis left long and left ring fingers  M65.332 and M65.342  PROCEDURE:  Procedure(s): RELEASE A-1 PULLEY LEFT LONG AND LEFT RING FINGERS (Left)  SURGEON:  Surgeon(s) and Role:    * Cindee Salt, MD - Primary  PHYSICIAN ASSISTANT:   ASSISTANTS: none   ANESTHESIA:   local and regional  EBL:  Total I/O In: 1700 [I.V.:1700] Out: -   BLOOD ADMINISTERED:none  DRAINS: none   LOCAL MEDICATIONS USED:  BUPIVICAINE   SPECIMEN:  No Specimen  DISPOSITION OF SPECIMEN:  N/A  COUNTS:  YES  TOURNIQUET:   Total Tourniquet Time Documented: Forearm (Left) - 22 minutes Total: Forearm (Left) - 22 minutes   DICTATION: .Other Dictation: Dictation Number [id number]  PLAN OF CARE: Discharge to home after PACU  PATIENT DISPOSITION:  PACU - hemodynamically stable.

## 2015-06-24 NOTE — Anesthesia Procedure Notes (Addendum)
Anesthesia Regional Block:  Bier block (IV Regional)  Pre-Anesthetic Checklist: ,, timeout performed, Correct Patient, Correct Site, Correct Laterality, Correct Procedure, Correct Position, site marked, Risks and benefits discussed,  Surgical consent,  Pre-op evaluation,  At surgeon's request and post-op pain management  Laterality: Left  Prep: alcohol swabs       Bier block (IV Regional) Narrative:   Additional Notes: 0.5 % Lidocaine 30 ml.  No adverse reaction or complication.   Procedure Name: MAC Date/Time: 06/24/2015 8:38 AM Performed by: Republic Desanctis Pre-anesthesia Checklist: Patient identified, Timeout performed, Emergency Drugs available, Suction available and Patient being monitored Patient Re-evaluated:Patient Re-evaluated prior to inductionOxygen Delivery Method: Simple face mask Preoxygenation: Pre-oxygenation with 100% oxygen

## 2015-06-24 NOTE — Op Note (Signed)
NAME:  Timothy Cervantes, Timothy Cervantes NO.:  [id number]  MEDICAL RECORD NO.:  [id number]  LOCATION:                                 FACILITY:  PHYSICIAN:  Cindee Salt, M.D.            DATE OF BIRTH:  DATE OF PROCEDURE:  06/24/2015 DATE OF DISCHARGE:                              OPERATIVE REPORT   PREOPERATIVE DIAGNOSIS:  Stenosing tenosynovitis, trigger fingers, left middle, left ring fingers.  POSTOPERATIVE DIAGNOSIS:  Stenosing tenosynovitis, trigger fingers, left middle, left ring fingers.  OPERATION:  Release of A1 pulley, left middle, left ring finger.  SURGEON:  Cindee Salt, M.D.  ANESTHESIA:  Forearm-based IV regional with sedation and metacarpal block.  ANESTHESIOLOGIST:  Sheldon Silvan, M.D.  SURGEON:  Cindee Salt, M.D.  ASSISTANT:  None.  HISTORY:  The patient is a 75 year old male with a history of multiple digits triggering on both right and left hand.  He has a laceration on his right small finger proceeding on his right side, although his right fingers are more symptomatic.  He is complaining of catching of his middle and ring fingers, left hand, and he has elected to undergo surgical decompression, release of the A1 pulley.  Pre, peri, and postoperative course have been discussed along with risks and complications.  He is aware that there is no guarantee with the surgery; possibility of infection; recurrence of injury to arteries, nerves, tendons; incomplete relief of symptoms; dystrophy.  In the preoperative area, the patient was seen, the extremity marked by both the patient and surgeon.  Antibiotic given.  DESCRIPTION OF PROCEDURE:  The patient was brought to the operating room where forearm-based IV regional anesthetic was carried out without difficulty.  He was prepped using ChloraPrep in supine position with the left arm free.  A 3-minute dry time was allowed.  Time-out taken, confirming the patient and procedure.  An oblique incision was made  over the A1 pulley of the left ring finger, carried down through subcutaneous tissue.  The dissection carried down to the A1 pulley.  Retractors placed, protecting neurovascular bundles radially and ulnarly.  The A1 pulley was then released on its radial aspect.  A small incision made centrally in A2. A partial tenosynovectomy performed proximally with separation of the 2 tendons, this was done with scissors and retractors. The finger placed through a full range of motion actively and passively including traction on the tendons, no further triggering was noted.  A separate incision was then made on the middle finger A1 pulley, volar aspect of the palm, carried down through subcutaneous tissue.  Again, retractors placed with the dissection carried down.  The A1 pulley was found to be markedly thickened on his middle finger.  This was also released on its radial aspect.  A small incision made centrally in the A2 pulley.  A partial tenosynovectomy performed proximally with scissors, retractors placed, separating the 2 tendons.  Finger passively placed through a full range motion, no further triggering was noted. Retractors were placed on the flexor tendons.  These were both brought into full flexion and no further triggering was noted.  Each wound was copiously irrigated with saline.  The skin then closed with interrupted 4-0 nylon sutures.  A sterile compressive dressing with the fingers free was applied.  Prior to application of the dressing, each area was infiltrated with 0.25% bupivacaine without epinephrine, total 10 mL was used.  On deflation of the tourniquet, all fingers immediately pinked. He was taken to the recovery room for observation in satisfactory condition.  He will be discharged home to return to the [facility] in 1 week on Norco.          ______________________________ Cindee Salt, M.D.     GK/MEDQ  D:  06/24/2015  T:  06/24/2015  Job:  [id number]

## 2015-06-24 NOTE — Transfer of Care (Signed)
Immediate Anesthesia Transfer of Care Note  Patient: Timothy Cervantes  Procedure(s) Performed: Procedure(s): RELEASE A-1 PULLEY LEFT LONG AND LEFT RING FINGERS (Left)  Patient Location: PACU  Anesthesia Type:MAC combined with regional for post-op pain  Level of Consciousness: sedated  Airway & Oxygen Therapy: Patient Spontanous Breathing and Patient connected to face mask oxygen  Post-op Assessment: Report given to RN and Post -op Vital signs reviewed and stable  Post vital signs: Reviewed and stable  Last Vitals:  Filed Vitals:   06/24/15 0731  BP: 118/66  Pulse: 69  Temp: 37.1 C  Resp: 20    Complications: No apparent anesthesia complications

## 2015-06-25 ENCOUNTER — Encounter (HOSPITAL_BASED_OUTPATIENT_CLINIC_OR_DEPARTMENT_OTHER): Payer: Self-pay | Admitting: Orthopedic Surgery

## 2015-06-25 LAB — GLUCOSE, CAPILLARY: Glucose-Capillary: 92 mg/dL (ref 65–99)

## 2015-07-15 DIAGNOSIS — E119 Type 2 diabetes mellitus without complications: Secondary | ICD-10-CM | POA: Diagnosis not present

## 2015-07-15 DIAGNOSIS — M797 Fibromyalgia: Secondary | ICD-10-CM | POA: Diagnosis not present

## 2015-07-15 DIAGNOSIS — I25118 Atherosclerotic heart disease of native coronary artery with other forms of angina pectoris: Secondary | ICD-10-CM | POA: Diagnosis not present

## 2015-07-15 DIAGNOSIS — M545 Low back pain: Secondary | ICD-10-CM | POA: Diagnosis not present

## 2015-07-15 DIAGNOSIS — I10 Essential (primary) hypertension: Secondary | ICD-10-CM | POA: Diagnosis not present

## 2015-07-15 DIAGNOSIS — Z6829 Body mass index (BMI) 29.0-29.9, adult: Secondary | ICD-10-CM | POA: Diagnosis not present

## 2015-07-16 DIAGNOSIS — H52203 Unspecified astigmatism, bilateral: Secondary | ICD-10-CM | POA: Diagnosis not present

## 2015-07-16 DIAGNOSIS — H25013 Cortical age-related cataract, bilateral: Secondary | ICD-10-CM | POA: Diagnosis not present

## 2015-07-16 DIAGNOSIS — E119 Type 2 diabetes mellitus without complications: Secondary | ICD-10-CM | POA: Diagnosis not present

## 2015-07-21 ENCOUNTER — Other Ambulatory Visit: Payer: Self-pay | Admitting: Orthopedic Surgery

## 2015-07-21 DIAGNOSIS — M65841 Other synovitis and tenosynovitis, right hand: Secondary | ICD-10-CM | POA: Diagnosis not present

## 2015-07-21 DIAGNOSIS — I10 Essential (primary) hypertension: Secondary | ICD-10-CM | POA: Diagnosis not present

## 2015-07-21 DIAGNOSIS — G4730 Sleep apnea, unspecified: Secondary | ICD-10-CM | POA: Diagnosis not present

## 2015-07-21 DIAGNOSIS — Z87891 Personal history of nicotine dependence: Secondary | ICD-10-CM | POA: Diagnosis not present

## 2015-07-23 ENCOUNTER — Encounter (HOSPITAL_BASED_OUTPATIENT_CLINIC_OR_DEPARTMENT_OTHER): Payer: Self-pay | Admitting: *Deleted

## 2015-07-25 ENCOUNTER — Encounter (HOSPITAL_BASED_OUTPATIENT_CLINIC_OR_DEPARTMENT_OTHER)
Admission: RE | Admit: 2015-07-25 | Discharge: 2015-07-25 | Disposition: A | Discharge status: Home / Self Care | Payer: PPO | Source: Ambulatory Visit | Attending: Orthopedic Surgery | Admitting: Orthopedic Surgery

## 2015-07-25 DIAGNOSIS — I10 Essential (primary) hypertension: Secondary | ICD-10-CM | POA: Diagnosis not present

## 2015-07-25 DIAGNOSIS — E119 Type 2 diabetes mellitus without complications: Secondary | ICD-10-CM | POA: Diagnosis not present

## 2015-07-25 DIAGNOSIS — I2510 Atherosclerotic heart disease of native coronary artery without angina pectoris: Secondary | ICD-10-CM | POA: Diagnosis not present

## 2015-07-25 DIAGNOSIS — Z885 Allergy status to narcotic agent status: Secondary | ICD-10-CM | POA: Diagnosis not present

## 2015-07-25 DIAGNOSIS — Z955 Presence of coronary angioplasty implant and graft: Secondary | ICD-10-CM | POA: Diagnosis not present

## 2015-07-25 DIAGNOSIS — E785 Hyperlipidemia, unspecified: Secondary | ICD-10-CM | POA: Diagnosis not present

## 2015-07-25 DIAGNOSIS — G4730 Sleep apnea, unspecified: Secondary | ICD-10-CM | POA: Diagnosis not present

## 2015-07-25 DIAGNOSIS — I252 Old myocardial infarction: Secondary | ICD-10-CM | POA: Diagnosis not present

## 2015-07-25 DIAGNOSIS — M65841 Other synovitis and tenosynovitis, right hand: Secondary | ICD-10-CM | POA: Diagnosis not present

## 2015-07-25 DIAGNOSIS — Z87891 Personal history of nicotine dependence: Secondary | ICD-10-CM | POA: Diagnosis not present

## 2015-07-25 DIAGNOSIS — M797 Fibromyalgia: Secondary | ICD-10-CM | POA: Diagnosis not present

## 2015-07-25 LAB — BASIC METABOLIC PANEL
Anion gap: 11 (ref 5–15)
BUN: 14 mg/dL (ref 6–20)
CALCIUM: 9.3 mg/dL (ref 8.9–10.3)
CO2: 25 mmol/L (ref 22–32)
CREATININE: 1.02 mg/dL (ref 0.61–1.24)
Chloride: 105 mmol/L (ref 101–111)
GFR calc non Af Amer: >60 mL/min (ref >60)
Glucose, Bld: 88 mg/dL (ref 65–99)
Potassium: 4.8 mmol/L (ref 3.5–5.1)
SODIUM: 141 mmol/L (ref 135–145)

## 2015-07-29 ENCOUNTER — Encounter (HOSPITAL_BASED_OUTPATIENT_CLINIC_OR_DEPARTMENT_OTHER): Payer: Self-pay | Admitting: Orthopedic Surgery

## 2015-07-29 ENCOUNTER — Encounter (HOSPITAL_BASED_OUTPATIENT_CLINIC_OR_DEPARTMENT_OTHER)
Admission: AD | Disposition: A | Discharge status: Home / Self Care | Payer: Self-pay | Source: Ambulatory Visit | Attending: Orthopedic Surgery

## 2015-07-29 ENCOUNTER — Ambulatory Visit (HOSPITAL_BASED_OUTPATIENT_CLINIC_OR_DEPARTMENT_OTHER): Payer: PPO | Admitting: Certified Registered"

## 2015-07-29 ENCOUNTER — Ambulatory Visit (HOSPITAL_BASED_OUTPATIENT_CLINIC_OR_DEPARTMENT_OTHER)
Admission: AD | Admit: 2015-07-29 | Discharge: 2015-07-29 | Disposition: A | Discharge status: Home / Self Care | Payer: PPO | Source: Ambulatory Visit | Attending: Orthopedic Surgery | Admitting: Orthopedic Surgery

## 2015-07-29 DIAGNOSIS — M65321 Trigger finger, right index finger: Secondary | ICD-10-CM | POA: Diagnosis not present

## 2015-07-29 DIAGNOSIS — M65351 Trigger finger, right little finger: Secondary | ICD-10-CM | POA: Diagnosis not present

## 2015-07-29 DIAGNOSIS — Z87891 Personal history of nicotine dependence: Secondary | ICD-10-CM | POA: Insufficient documentation

## 2015-07-29 DIAGNOSIS — M65341 Trigger finger, right ring finger: Secondary | ICD-10-CM | POA: Diagnosis not present

## 2015-07-29 DIAGNOSIS — E119 Type 2 diabetes mellitus without complications: Secondary | ICD-10-CM | POA: Insufficient documentation

## 2015-07-29 DIAGNOSIS — I2510 Atherosclerotic heart disease of native coronary artery without angina pectoris: Secondary | ICD-10-CM | POA: Insufficient documentation

## 2015-07-29 DIAGNOSIS — Z955 Presence of coronary angioplasty implant and graft: Secondary | ICD-10-CM | POA: Insufficient documentation

## 2015-07-29 DIAGNOSIS — I252 Old myocardial infarction: Secondary | ICD-10-CM | POA: Diagnosis not present

## 2015-07-29 DIAGNOSIS — M65841 Other synovitis and tenosynovitis, right hand: Secondary | ICD-10-CM | POA: Insufficient documentation

## 2015-07-29 DIAGNOSIS — M65331 Trigger finger, right middle finger: Secondary | ICD-10-CM | POA: Diagnosis not present

## 2015-07-29 DIAGNOSIS — G4730 Sleep apnea, unspecified: Secondary | ICD-10-CM | POA: Diagnosis not present

## 2015-07-29 DIAGNOSIS — Z885 Allergy status to narcotic agent status: Secondary | ICD-10-CM | POA: Insufficient documentation

## 2015-07-29 DIAGNOSIS — I10 Essential (primary) hypertension: Secondary | ICD-10-CM | POA: Insufficient documentation

## 2015-07-29 DIAGNOSIS — E785 Hyperlipidemia, unspecified: Secondary | ICD-10-CM | POA: Insufficient documentation

## 2015-07-29 DIAGNOSIS — M797 Fibromyalgia: Secondary | ICD-10-CM | POA: Insufficient documentation

## 2015-07-29 HISTORY — PX: TRIGGER FINGER RELEASE: SHX641

## 2015-07-29 HISTORY — DX: Anxiety disorder, unspecified: F41.9

## 2015-07-29 LAB — GLUCOSE, CAPILLARY
Glucose-Capillary: 115 mg/dL — ABNORMAL HIGH (ref 65–99)
Glucose-Capillary: 104 mg/dL — ABNORMAL HIGH (ref 65–99)

## 2015-07-29 SURGERY — RELEASE, A1 PULLEY, FOR TRIGGER FINGER
Anesthesia: Monitor Anesthesia Care | Site: Finger | Laterality: Right

## 2015-07-29 MED ORDER — FENTANYL CITRATE (PF) 100 MCG/2ML IJ SOLN: 25.0000 ug | INTRAMUSCULAR | Status: DC | PRN

## 2015-07-29 MED ORDER — CEFAZOLIN SODIUM-DEXTROSE 2-3 GM-% IV SOLR
INTRAVENOUS | Status: AC
  Filled 2015-07-29: qty 50

## 2015-07-29 MED ORDER — LIDOCAINE HCL (CARDIAC) 20 MG/ML IV SOLN
INTRAVENOUS | Status: AC
  Filled 2015-07-29: qty 5

## 2015-07-29 MED ORDER — PROPOFOL 10 MG/ML IV BOLUS
INTRAVENOUS | Status: AC
  Filled 2015-07-29: qty 40

## 2015-07-29 MED ORDER — CHLORHEXIDINE GLUCONATE 4 % EX LIQD: 60.0000 mL | Freq: Once | CUTANEOUS | Status: DC

## 2015-07-29 MED ORDER — PROPOFOL 500 MG/50ML IV EMUL
INTRAVENOUS | Status: DC | PRN
  Administered 2015-07-29: 75 ug/kg/min via INTRAVENOUS

## 2015-07-29 MED ORDER — ONDANSETRON HCL 4 MG/2ML IJ SOLN: 4.0000 mg | Freq: Once | INTRAMUSCULAR | Status: DC | PRN

## 2015-07-29 MED ORDER — MIDAZOLAM HCL 5 MG/5ML IJ SOLN
INTRAMUSCULAR | Status: DC | PRN
  Administered 2015-07-29: 2 mg via INTRAVENOUS

## 2015-07-29 MED ORDER — MIDAZOLAM HCL 5 MG/5ML IJ SOLN: INTRAMUSCULAR | Status: DC | PRN

## 2015-07-29 MED ORDER — MIDAZOLAM HCL 2 MG/2ML IJ SOLN
INTRAMUSCULAR | Status: AC
  Filled 2015-07-29: qty 2

## 2015-07-29 MED ORDER — FENTANYL CITRATE (PF) 100 MCG/2ML IJ SOLN: 50.0000 ug | INTRAMUSCULAR | Status: DC | PRN

## 2015-07-29 MED ORDER — GLYCOPYRROLATE 0.2 MG/ML IJ SOLN: 0.2000 mg | Freq: Once | INTRAMUSCULAR | Status: DC | PRN

## 2015-07-29 MED ORDER — LIDOCAINE HCL (CARDIAC) 20 MG/ML IV SOLN
INTRAVENOUS | Status: DC | PRN
  Administered 2015-07-29: 10 mg via INTRAVENOUS

## 2015-07-29 MED ORDER — CEFAZOLIN SODIUM-DEXTROSE 2-3 GM-% IV SOLR
2.0000 g | INTRAVENOUS | Status: AC
Start: 2015-07-29 — End: 2015-07-29
  Administered 2015-07-29: 2 g via INTRAVENOUS

## 2015-07-29 MED ORDER — LIDOCAINE HCL (PF) 0.5 % IJ SOLN
INTRAMUSCULAR | Status: DC | PRN
Start: 2015-07-29 — End: 2015-07-29
  Administered 2015-07-29: 30 mL via INTRAVENOUS

## 2015-07-29 MED ORDER — LACTATED RINGERS IV SOLN
INTRAVENOUS | Status: DC
  Administered 2015-07-29 (×2): via INTRAVENOUS

## 2015-07-29 MED ORDER — BUPIVACAINE HCL (PF) 0.25 % IJ SOLN
INTRAMUSCULAR | Status: AC
  Filled 2015-07-29: qty 30

## 2015-07-29 MED ORDER — MIDAZOLAM HCL 2 MG/2ML IJ SOLN: 1.0000 mg | INTRAMUSCULAR | Status: DC | PRN

## 2015-07-29 MED ORDER — ONDANSETRON HCL 4 MG/2ML IJ SOLN
INTRAMUSCULAR | Status: AC
  Filled 2015-07-29: qty 2

## 2015-07-29 MED ORDER — SCOPOLAMINE 1 MG/3DAYS TD PT72: 1.0000 | MEDICATED_PATCH | Freq: Once | TRANSDERMAL | Status: DC | PRN

## 2015-07-29 MED ORDER — DEXAMETHASONE SODIUM PHOSPHATE 10 MG/ML IJ SOLN
INTRAMUSCULAR | Status: AC
  Filled 2015-07-29: qty 1

## 2015-07-29 MED ORDER — BUPIVACAINE HCL (PF) 0.25 % IJ SOLN
INTRAMUSCULAR | Status: DC | PRN
  Administered 2015-07-29: 9 mL

## 2015-07-29 MED ORDER — FENTANYL CITRATE (PF) 100 MCG/2ML IJ SOLN
INTRAMUSCULAR | Status: AC
  Filled 2015-07-29: qty 2

## 2015-07-29 SURGICAL SUPPLY — 36 items
BANDAGE COBAN STERILE 2 (GAUZE/BANDAGES/DRESSINGS) ×3 IMPLANT
BLADE SURG 15 STRL LF DISP TIS (BLADE) ×2 IMPLANT
BLADE SURG 15 STRL SS (BLADE) ×4
BNDG ESMARK 4X9 LF (GAUZE/BANDAGES/DRESSINGS) IMPLANT
CHLORAPREP W/TINT 26ML (MISCELLANEOUS) ×3 IMPLANT
CORDS BIPOLAR (ELECTRODE) IMPLANT
COVER BACK TABLE 60X90IN (DRAPES) ×3 IMPLANT
COVER MAYO STAND STRL (DRAPES) ×3 IMPLANT
CUFF TOURNIQUET SINGLE 18IN (TOURNIQUET CUFF) ×3 IMPLANT
DECANTER SPIKE VIAL GLASS SM (MISCELLANEOUS) IMPLANT
DRAPE EXTREMITY T 121X128X90 (DRAPE) ×3 IMPLANT
DRAPE SURG 17X23 STRL (DRAPES) ×3 IMPLANT
GAUZE SPONGE 4X4 12PLY STRL (GAUZE/BANDAGES/DRESSINGS) ×3 IMPLANT
GAUZE XEROFORM 1X8 LF (GAUZE/BANDAGES/DRESSINGS) ×3 IMPLANT
GLOVE BIOGEL PI IND STRL 7.0 (GLOVE) ×2 IMPLANT
GLOVE BIOGEL PI IND STRL 7.5 (GLOVE) ×1 IMPLANT
GLOVE BIOGEL PI IND STRL 8.5 (GLOVE) ×1 IMPLANT
GLOVE BIOGEL PI INDICATOR 7.0 (GLOVE) ×4
GLOVE BIOGEL PI INDICATOR 7.5 (GLOVE) ×2
GLOVE BIOGEL PI INDICATOR 8.5 (GLOVE) ×2
GLOVE SURG ORTHO 8.0 STRL STRW (GLOVE) IMPLANT
GLOVE SURG SS PI 7.0 STRL IVOR (GLOVE) ×3 IMPLANT
GLOVE SURG SS PI 7.5 STRL IVOR (GLOVE) ×3 IMPLANT
GLOVE SURG SS PI 8.0 STRL IVOR (GLOVE) ×3 IMPLANT
GOWN STRL REUS W/ TWL LRG LVL3 (GOWN DISPOSABLE) ×2 IMPLANT
GOWN STRL REUS W/TWL LRG LVL3 (GOWN DISPOSABLE) ×4
GOWN STRL REUS W/TWL XL LVL3 (GOWN DISPOSABLE) ×3 IMPLANT
NEEDLE PRECISIONGLIDE 27X1.5 (NEEDLE) ×3 IMPLANT
NS IRRIG 1000ML POUR BTL (IV SOLUTION) ×3 IMPLANT
PACK BASIN DAY SURGERY FS (CUSTOM PROCEDURE TRAY) ×3 IMPLANT
STOCKINETTE 4X48 STRL (DRAPES) ×3 IMPLANT
SUT ETHILON 4 0 PS 2 18 (SUTURE) ×6 IMPLANT
SYR BULB 3OZ (MISCELLANEOUS) ×3 IMPLANT
SYR CONTROL 10ML LL (SYRINGE) ×3 IMPLANT
TOWEL OR 17X24 6PK STRL BLUE (TOWEL DISPOSABLE) ×6 IMPLANT
UNDERPAD 30X30 (UNDERPADS AND DIAPERS) IMPLANT

## 2015-07-29 NOTE — Op Note (Signed)
NAME:  Timothy Cervantes, Timothy Cervantes              ACCOUNT NO.:  [id number]  MEDICAL RECORD NO.:  [id number]  LOCATION:                                 FACILITY:  PHYSICIAN:  Cindee Salt, M.D.            DATE OF BIRTH:  DATE OF PROCEDURE:  07/29/2015 DATE OF DISCHARGE:                              OPERATIVE REPORT   PREOPERATIVE DIAGNOSIS:  Stenosing tenosynovitis, right index, right middle, right ring, right with small fingers.  POSTOPERATIVE DIAGNOSIS:  Stenosing tenosynovitis, right index, right middle, right ring, right small fingers.  OPERATION:  Release of A1 pulley, right index, right middle, right ring, right small finger.  SURGEON:  Cindee Salt, MD  ANESTHESIA:  Forearm based IV regional with local infiltration.  HISTORY:  The patient is a 75 year old male with a history of triggering of the fingers on both hands.  He has undergone release on his left side.  He is admitted now for release on his right index, middle, ring, and small.  Pre, peri, postoperative course have been discussed along with risks and complications.  He is aware that there is no guarantee with the surgery; possibility of infection; recurrence of injury to arteries, nerves, tendons; incomplete relief of symptoms; and dystrophy. In preoperative area, the patient was seen, the extremity marked by both patient and surgeon.  Antibiotic given.  PROCEDURE IN DETAIL:  The patient was brought to the operating room, where forearm-based IV regional anesthetic was carried out without difficulty.  He was prepped using ChloraPrep, supine position with the right arm free.  A 3-minute dry time was allowed.  Time-out taken, confirming patient and procedure.  Each finger was dressed separately. Oblique incision was made over the index finger, metacarpophalangeal joint carried down through subcutaneous tissue.  Bleeders were electrocauterized with bipolar.  Neurovascular structures were identified and protected with retractors,  radially and ulnarly.  The A1 pulley was found to be thickened, tight, this was released on its radial aspect.  A small incision made centrally in A2.  The 2 tendons were then separated removing any adhesions between the 2 partial tenosynovectomy performed proximally.  A separate incision was then made over the middle finger metacarpophalangeal joint, again carried down through subcutaneous tissue.  Bleeders again electrocauterized with bipolar. The dissection was carried down to the A1 pulley.  Retractors placed, protecting neurovascular bundles radially and ulnarly.  Thickening of the A1 pulley was found to be again present, this was released on its radial aspect to the small incision made centrally in A2.  The tenosynovial tissue was then dissected proximally.  The tendon separated removing any adhesions between the 2 tendons.  The finger was placed through a full range of motion passively.  No further triggering was noted as was noted on the index finger.  A separate incision was then made obliquely over the ring finger, metacarpophalangeal joint, carried down through subcutaneous tissue.  Bleeders were again electrocauterized with bipolar.  The A1 pulley was found to be thickened again, this was released on its radial aspect after replacing retractors protecting neurovascular bundles radially and ulnarly.  A small incision was made centrally in A2 and a partial tenosynovectomy,  separation of the 2 tendons was done proximally.  Again, the finger was placed through a full range motion passively.  No further triggering was noted.  The small finger was attended to last, this was incised over the metacarpophalangeal joint again obliquely, carried down through subcutaneous tissue.  Bleeders again electrocauterized.  Dissection was carried down to the A1 pulley which again was markedly thickened. Retractors placed protecting neurovascular bundles radially and ulnarly. The A1 pulley was  released on its radial aspect.  A small incision made centrally in A2.  The 2 tendons were separated with a partial tenosynovectomy performed proximally.  Each of the fingers were placed in full flexion, passively mobilized to see if there was any further triggering, none was noted on any of the fingers.  Each of the wounds was copiously irrigated with saline and each wound was separately closed with interrupted 4-0 nylon sutures.  A local infiltration of 0.25% bupivacaine without epinephrine was given, 10 mL total was used.  A sterile compressive dressing with fingers free was applied.  On deflation of the tourniquet, all fingers immediately pinked.  He was taken to the recovery room for observation in satisfactory condition. He will be discharged home to return to the [facility] in 1 week on Norco which he has at home from his prior procedures.          ______________________________ Cindee Salt, M.D.     GK/MEDQ  D:  07/29/2015  T:  07/29/2015  Job:  [id number]

## 2015-07-29 NOTE — Op Note (Signed)
Dictation Number [id number]

## 2015-07-29 NOTE — Anesthesia Preprocedure Evaluation (Addendum)
Anesthesia Evaluation  Patient identified by MRN, date of birth, ID band Patient awake    Reviewed: Allergy & Precautions, NPO status , Patient's Chart, lab work & pertinent test results  History of Anesthesia Complications (+) PROLONGED EMERGENCE and history of anesthetic complications  Airway Mallampati: II  TM Distance: >3 FB Neck ROM: Full    Dental no notable dental hx. (+) Dental Advisory Given   Pulmonary sleep apnea , former smoker,    Pulmonary exam normal breath sounds clear to auscultation       Cardiovascular hypertension, Pt. on medications + CAD, + Past MI and + CABG (5V CABG March 2014)  Normal cardiovascular exam Rhythm:Regular Rate:Normal     Neuro/Psych PSYCHIATRIC DISORDERS Anxiety Depression negative neurological ROS     GI/Hepatic Neg liver ROS, GERD  Controlled and Medicated,  Endo/Other  diabetes  Renal/GU negative Renal ROS  negative genitourinary   Musculoskeletal  (+) Arthritis , Fibromyalgia -  Abdominal   Peds negative pediatric ROS (+)  Hematology negative hematology ROS (+)   Anesthesia Other Findings   Reproductive/Obstetrics negative OB ROS                           Anesthesia Physical Anesthesia Plan  ASA: III  Anesthesia Plan: MAC   Post-op Pain Management:    Induction: Intravenous  Airway Management Planned: Nasal Cannula  Additional Equipment:   Intra-op Plan:   Post-operative Plan: Extubation in OR  Informed Consent: I have reviewed the patients History and Physical, chart, labs and discussed the procedure including the risks, benefits and alternatives for the proposed anesthesia with the patient or authorized representative who has indicated his/her understanding and acceptance.   Dental advisory given  Plan Discussed with: CRNA  Anesthesia Plan Comments:        Anesthesia Quick Evaluation

## 2015-07-29 NOTE — Discharge Instructions (Addendum)

## 2015-07-29 NOTE — Transfer of Care (Signed)
Immediate Anesthesia Transfer of Care Note  Patient: Timothy Cervantes  Procedure(s) Performed: Procedure(s): RELEASE A-1 PULLEY RIGHT INDEX FINGER, RIGHT MIDDLE FINGER, RIGHT RING FINGER, RIGHT SMALL FINGER (Right)  Patient Location: PACU  Anesthesia Type:Bier block  Level of Consciousness: awake and patient cooperative  Airway & Oxygen Therapy: Patient Spontanous Breathing and Patient connected to face mask oxygen  Post-op Assessment: Report given to RN and Post -op Vital signs reviewed and stable  Post vital signs: Reviewed and stable  Last Vitals:  Filed Vitals:   07/29/15 0829  BP: 117/63  Pulse: 64  Temp: 36.7 C  Resp: 18    Complications: No apparent anesthesia complications

## 2015-07-29 NOTE — Brief Op Note (Signed)
07/29/2015  10:18 AM  PATIENT:  Timothy Cervantes  75 y.o. male  PRE-OPERATIVE DIAGNOSIS:  STENOSING TENOSYNOVITISRIGHT RIGHT INDEX FINGER, MIDDLE FINGER, RIGHT RING FINGER, RIGHT SMALL FINGER  POST-OPERATIVE DIAGNOSIS:  STENOSING TENOSYNOVITISRIGHT RIGHT INDEX FINGER, MIDDLE FINGER, RIGHT RING FINGER, RIGHT SMALL FINGER  PROCEDURE:  Procedure(s): RELEASE A-1 PULLEY RIGHT INDEX FINGER, RIGHT MIDDLE FINGER, RIGHT RING FINGER, RIGHT SMALL FINGER (Right)  SURGEON:  Surgeon(s) and Role:    * Cindee Salt, MD - Primary  PHYSICIAN ASSISTANT:   ASSISTANTS: none   ANESTHESIA:   local and regional  EBL:  Total I/O In: 1000 [I.V.:1000] Out: -   BLOOD ADMINISTERED:none  DRAINS: none   LOCAL MEDICATIONS USED:  BUPIVICAINE   SPECIMEN:  No Specimen  DISPOSITION OF SPECIMEN:  N/A  COUNTS:  YES  TOURNIQUET:   Total Tourniquet Time Documented: Forearm (Right) - 29 minutes Total: Forearm (Right) - 29 minutes   DICTATION: .Other Dictation: Dictation Number [id number]  PLAN OF CARE: Discharge to home after PACU  PATIENT DISPOSITION:  PACU - hemodynamically stable.

## 2015-07-29 NOTE — H&P (Signed)
Timothy Cervantes is an 75 y.o. male.   Chief Complaint: catching fingers both hands HPI: Timothy Cervantes is 75 years old  With mutiple trigger fingers. He has had trigger finger releases of middle and ring on his left side.[name] He is very pleased overall there is no triggering or catching  He can make composite fist wounds are well-healed. His range of motion is full. He is now complaining of triggering of his right index, right middle, right ring and right small fingers.  He has discrete triggering of each of these fingers. .   Past Medical History  Diagnosis Date  . Diabetes mellitus without complication (HCC)     a. Dx in 2010  . Back pain, chronic   . DJD (degenerative joint disease)   . History of tobacco abuse     a. 57 year hx, quit 03/2012.  [name] Chronic pain   . Fibromyalgia   . CAD (coronary artery disease)     a. s/p NSTEMI 07/2012 => LHC with 3v CAD => s/p CABG 08/25/12 (LIMA-LAD, SVG-DX, SVG-OM1/OM2, SVG-PDA) Dr. Tyrone Sage;  b.  Echo 08/24/12:  Mild LVH, EF 65-70%, mild RVE, PASP 36  . Chronic back pain   . HLD (hyperlipidemia)   . Shortness of breath dyspnea     with extertion  . GERD (gastroesophageal reflux disease)   . Complication of anesthesia     hard to wake up  . Osteoarthritis   . Depression   . Fatty liver   . Sleep apnea     uses CPAP every night  . Sleep apnea   . Anxiety   . Hypertension     Past Surgical History  Procedure Laterality Date  . Right ankle surgery    . Cholecystectomy    . Bilateral rotator cuff repair    . Transurethral resection of prostate    . Vocal cord tumor resection    . Parotid gland resection- non cancerous Left     parotid gland resection-non cancerous  . Coronary artery bypass graft N/A 08/25/2012    Procedure: CORONARY ARTERY BYPASS GRAFTING (CABG);  Surgeon: Delight Ovens, MD;  Location: [facility] OR;  Service: Open Heart Surgery;  Laterality: N/A;  Coronary artery bypass graft times five using left internal mammary artery and  bilateral saphenous leg vein using endoscope.  . Intraoperative transesophageal echocardiogram N/A 08/25/2012    Procedure: INTRAOPERATIVE TRANSESOPHAGEAL ECHOCARDIOGRAM;  Surgeon: Delight Ovens, MD;  Location: [facility] OR;  Service: Open Heart Surgery;  Laterality: N/A;  . Left heart catheterization with coronary angiogram N/A 08/23/2012    Procedure: LEFT HEART CATHETERIZATION WITH CORONARY ANGIOGRAM;  Surgeon: Kathleene Hazel, MD;  Location: [facility] CATH LAB;  Service: Cardiovascular;  Laterality: N/A;  . Shoulder surgery Bilateral   . Nasal sinus surgery    . Trigger finger release Left 06/24/2015    Procedure: RELEASE A-1 PULLEY LEFT LONG AND LEFT RING FINGERS;  Surgeon: Cindee Salt, MD;  Location: [facility];  Service: Orthopedics;  Laterality: Left;    Family History  Problem Relation Age of Onset  . Heart attack Father     died @ 37  . Lung cancer Mother     died @ 30  . Other Sister     [employer] in her 14's  . Bladder Cancer Brother     alive   Social History:  reports that he has quit smoking. His smoking use included Cigarettes. He has a 57 pack-year smoking history. He has  quit using smokeless tobacco. His smokeless tobacco use included Chew. He reports that he drinks alcohol. He reports that he does not use illicit drugs.  Allergies:  Allergies  Allergen Reactions  . Latex     Had reaction to latex foley catheter, but ok with gloves  . Morphine And Related Nausea Only  . Neosporin [Neomycin-Polymyxin-Gramicidin] Rash    No prescriptions prior to admission    No results found for this or any previous visit (from the past 48 hour(s)).  No results found.   Pertinent items are noted in HPI.  Height  (1.854 m), weight 99.791 kg (220 lb).  General appearance: alert, cooperative and appears stated age Head: Normocephalic, without obvious abnormality Neck: no JVD Resp: clear to auscultation bilaterally Cardio: regular rate and rhythm, S1, S2 normal, no  murmur, click, rub or gallop GI: soft, non-tender; bowel sounds normal; no masses,  no organomegaly Extremities: catching all fingers right hand Pulses: 2+ and symmetric Skin: Skin color, texture, turgor normal. No rashes or lesions Neurologic: Grossly normal Incision/Wound: na  Assessment/Plan Diagnosis: trigger right index, right middle, right ring and right small fingers  Plan: He would like to proceed with release of the A1 pulley of the right index, right middle, right ring and small fingers. These will be scheduled as an outpatient under regional anesthesia for A1 pulley release. Questions are encouraged and answered to his satisfaction. He is aware that there is no guarantee with the surgery, possibility of infection, recurrence, injury to arteries, nerves, tendons, complete relief of symptoms dystrophy.   [name] 07/29/2015, 3:46 AM

## 2015-07-29 NOTE — Anesthesia Postprocedure Evaluation (Signed)
Anesthesia Post Note  Patient: Timothy Cervantes  Procedure(s) Performed: Procedure(s) (LRB): RELEASE A-1 PULLEY RIGHT INDEX FINGER, RIGHT MIDDLE FINGER, RIGHT RING FINGER, RIGHT SMALL FINGER (Right)  Patient location during evaluation: PACU Anesthesia Type: MAC Level of consciousness: awake and alert Pain management: pain level controlled Vital Signs Assessment: post-procedure vital signs reviewed and stable Respiratory status: spontaneous breathing, nonlabored ventilation, respiratory function stable and patient connected to nasal cannula oxygen Cardiovascular status: blood pressure returned to baseline and stable Postop Assessment: no signs of nausea or vomiting Anesthetic complications: no    Last Vitals:  Filed Vitals:   07/29/15 1045 07/29/15 1115  BP: 123/70 124/67  Pulse: 76 74  Temp:  36.6 C  Resp: 22 18    Last Pain:  Filed Vitals:   07/29/15 1121  PainSc: 0-No pain                 Braycen Burandt JENNETTE

## 2015-07-30 ENCOUNTER — Encounter (HOSPITAL_BASED_OUTPATIENT_CLINIC_OR_DEPARTMENT_OTHER): Payer: Self-pay | Admitting: Orthopedic Surgery

## 2015-07-30 NOTE — Addendum Note (Signed)
Addendum  created 07/30/15 1125 by Lance Coon, CRNA   Modules edited: Charges VN

## 2015-08-04 DIAGNOSIS — M25531 Pain in right wrist: Secondary | ICD-10-CM | POA: Diagnosis not present

## 2015-08-11 DIAGNOSIS — M25531 Pain in right wrist: Secondary | ICD-10-CM | POA: Diagnosis not present

## 2015-08-29 ENCOUNTER — Inpatient Hospital Stay (HOSPITAL_COMMUNITY)
Admission: EM | Admit: 2015-08-29 | Discharge: 2015-08-30 | DRG: 303 | Disposition: A | Discharge status: Home / Self Care | Payer: PPO | Attending: Cardiovascular Disease | Admitting: Cardiovascular Disease

## 2015-08-29 ENCOUNTER — Encounter (HOSPITAL_COMMUNITY): Payer: Self-pay | Admitting: Family Medicine

## 2015-08-29 ENCOUNTER — Telehealth: Payer: Self-pay | Admitting: Cardiovascular Disease

## 2015-08-29 ENCOUNTER — Emergency Department (HOSPITAL_COMMUNITY): Payer: PPO

## 2015-08-29 DIAGNOSIS — I4510 Unspecified right bundle-branch block: Secondary | ICD-10-CM | POA: Diagnosis not present

## 2015-08-29 DIAGNOSIS — E119 Type 2 diabetes mellitus without complications: Secondary | ICD-10-CM

## 2015-08-29 DIAGNOSIS — R0602 Shortness of breath: Secondary | ICD-10-CM | POA: Diagnosis not present

## 2015-08-29 DIAGNOSIS — I252 Old myocardial infarction: Secondary | ICD-10-CM

## 2015-08-29 DIAGNOSIS — E785 Hyperlipidemia, unspecified: Secondary | ICD-10-CM | POA: Diagnosis present

## 2015-08-29 DIAGNOSIS — G4730 Sleep apnea, unspecified: Secondary | ICD-10-CM | POA: Diagnosis present

## 2015-08-29 DIAGNOSIS — I25110 Atherosclerotic heart disease of native coronary artery with unstable angina pectoris: Secondary | ICD-10-CM | POA: Diagnosis not present

## 2015-08-29 DIAGNOSIS — M549 Dorsalgia, unspecified: Secondary | ICD-10-CM | POA: Diagnosis present

## 2015-08-29 DIAGNOSIS — K219 Gastro-esophageal reflux disease without esophagitis: Secondary | ICD-10-CM | POA: Diagnosis present

## 2015-08-29 DIAGNOSIS — Z91040 Latex allergy status: Secondary | ICD-10-CM | POA: Diagnosis not present

## 2015-08-29 DIAGNOSIS — K760 Fatty (change of) liver, not elsewhere classified: Secondary | ICD-10-CM | POA: Diagnosis not present

## 2015-08-29 DIAGNOSIS — R0600 Dyspnea, unspecified: Secondary | ICD-10-CM

## 2015-08-29 DIAGNOSIS — Z8249 Family history of ischemic heart disease and other diseases of the circulatory system: Secondary | ICD-10-CM

## 2015-08-29 DIAGNOSIS — Z7982 Long term (current) use of aspirin: Secondary | ICD-10-CM | POA: Diagnosis not present

## 2015-08-29 DIAGNOSIS — G8929 Other chronic pain: Secondary | ICD-10-CM | POA: Diagnosis not present

## 2015-08-29 DIAGNOSIS — M797 Fibromyalgia: Secondary | ICD-10-CM | POA: Diagnosis not present

## 2015-08-29 DIAGNOSIS — Z87891 Personal history of nicotine dependence: Secondary | ICD-10-CM | POA: Diagnosis not present

## 2015-08-29 DIAGNOSIS — R5383 Other fatigue: Secondary | ICD-10-CM

## 2015-08-29 DIAGNOSIS — E7800 Pure hypercholesterolemia, unspecified: Secondary | ICD-10-CM | POA: Diagnosis present

## 2015-08-29 DIAGNOSIS — I110 Hypertensive heart disease with heart failure: Secondary | ICD-10-CM | POA: Diagnosis present

## 2015-08-29 DIAGNOSIS — R079 Chest pain, unspecified: Secondary | ICD-10-CM

## 2015-08-29 DIAGNOSIS — Z801 Family history of malignant neoplasm of trachea, bronchus and lung: Secondary | ICD-10-CM | POA: Diagnosis not present

## 2015-08-29 DIAGNOSIS — I5032 Chronic diastolic (congestive) heart failure: Secondary | ICD-10-CM | POA: Diagnosis not present

## 2015-08-29 DIAGNOSIS — Z79899 Other long term (current) drug therapy: Secondary | ICD-10-CM | POA: Diagnosis not present

## 2015-08-29 DIAGNOSIS — I255 Ischemic cardiomyopathy: Secondary | ICD-10-CM | POA: Diagnosis present

## 2015-08-29 DIAGNOSIS — Z881 Allergy status to other antibiotic agents status: Secondary | ICD-10-CM | POA: Diagnosis not present

## 2015-08-29 DIAGNOSIS — Z951 Presence of aortocoronary bypass graft: Secondary | ICD-10-CM

## 2015-08-29 DIAGNOSIS — I10 Essential (primary) hypertension: Secondary | ICD-10-CM | POA: Diagnosis present

## 2015-08-29 DIAGNOSIS — F329 Major depressive disorder, single episode, unspecified: Secondary | ICD-10-CM | POA: Diagnosis not present

## 2015-08-29 DIAGNOSIS — Z8052 Family history of malignant neoplasm of bladder: Secondary | ICD-10-CM | POA: Diagnosis not present

## 2015-08-29 DIAGNOSIS — Z7984 Long term (current) use of oral hypoglycemic drugs: Secondary | ICD-10-CM

## 2015-08-29 DIAGNOSIS — I200 Unstable angina: Secondary | ICD-10-CM | POA: Diagnosis present

## 2015-08-29 DIAGNOSIS — Z885 Allergy status to narcotic agent status: Secondary | ICD-10-CM

## 2015-08-29 HISTORY — DX: Non-ST elevation (NSTEMI) myocardial infarction: I21.4

## 2015-08-29 HISTORY — DX: Type 2 diabetes mellitus without complications: E11.9

## 2015-08-29 LAB — CBC
HCT: 45.6 % (ref 39.0–52.0)
HEMATOCRIT: 42.3 % (ref 39.0–52.0)
HEMOGLOBIN: 15.1 g/dL (ref 13.0–17.0)
HEMOGLOBIN: 14 g/dL (ref 13.0–17.0)
MCH: 29.3 pg (ref 26.0–34.0)
MCH: 29.1 pg (ref 26.0–34.0)
MCHC: 33.1 g/dL (ref 30.0–36.0)
MCHC: 33.1 g/dL (ref 30.0–36.0)
MCV: 88.5 fL (ref 78.0–100.0)
MCV: 87.9 fL (ref 78.0–100.0)
PLATELETS: 267 10*3/uL (ref 150–400)
PLATELETS: 229 10*3/uL (ref 150–400)
RBC: 5.15 MIL/uL (ref 4.22–5.81)
RBC: 4.81 MIL/uL (ref 4.22–5.81)
RDW: 13.9 % (ref 11.5–15.5)
RDW: 13.8 % (ref 11.5–15.5)
WBC: 5.4 10*3/uL (ref 4.0–10.5)
WBC: 5.3 10*3/uL (ref 4.0–10.5)

## 2015-08-29 LAB — BASIC METABOLIC PANEL
ANION GAP: 12 (ref 5–15)
BUN: 17 mg/dL (ref 6–20)
CALCIUM: 9.3 mg/dL (ref 8.9–10.3)
CO2: 21 mmol/L — AB (ref 22–32)
CREATININE: 0.93 mg/dL (ref 0.61–1.24)
Chloride: 106 mmol/L (ref 101–111)
GLUCOSE: 162 mg/dL — AB (ref 65–99)
Potassium: 4.4 mmol/L (ref 3.5–5.1)
Sodium: 139 mmol/L (ref 135–145)

## 2015-08-29 LAB — TROPONIN I

## 2015-08-29 LAB — I-STAT TROPONIN, ED: TROPONIN I, POC: 0 ng/mL (ref 0.00–0.08)

## 2015-08-29 LAB — GLUCOSE, CAPILLARY: Glucose-Capillary: 113 mg/dL — ABNORMAL HIGH (ref 65–99)

## 2015-08-29 LAB — URINALYSIS, ROUTINE W REFLEX MICROSCOPIC
BILIRUBIN URINE: NEGATIVE
GLUCOSE, UA: NEGATIVE mg/dL
HGB URINE DIPSTICK: NEGATIVE
KETONES UR: NEGATIVE mg/dL
LEUKOCYTES UA: NEGATIVE
Nitrite: NEGATIVE
PH: 5 (ref 5.0–8.0)
PROTEIN: NEGATIVE mg/dL
Specific Gravity, Urine: 1.007 (ref 1.005–1.030)

## 2015-08-29 LAB — CREATININE, SERUM
Creatinine, Ser: 1 mg/dL (ref 0.61–1.24)
GFR calc non Af Amer: >60 mL/min (ref >60)

## 2015-08-29 LAB — TSH: TSH: 0.541 u[IU]/mL (ref 0.350–4.500)

## 2015-08-29 MED ORDER — BUPROPION HCL ER (SR) 150 MG PO TB12
150.0000 mg | ORAL_TABLET | Freq: Every day | ORAL | Status: DC
  Administered 2015-08-30: 150 mg via ORAL
  Filled 2015-08-29: qty 1

## 2015-08-29 MED ORDER — NITROGLYCERIN 0.4 MG SL SUBL: 0.4000 mg | SUBLINGUAL_TABLET | SUBLINGUAL | Status: DC | PRN

## 2015-08-29 MED ORDER — INSULIN ASPART 100 UNIT/ML ~~LOC~~ SOLN: 0.0000 [IU] | Freq: Three times a day (TID) | SUBCUTANEOUS | Status: DC

## 2015-08-29 MED ORDER — ACETAMINOPHEN 325 MG PO TABS: 650.0000 mg | ORAL_TABLET | ORAL | Status: DC | PRN

## 2015-08-29 MED ORDER — ONDANSETRON HCL 4 MG/2ML IJ SOLN
4.0000 mg | Freq: Four times a day (QID) | INTRAMUSCULAR | Status: DC | PRN
Start: 2015-08-29 — End: 2015-08-30

## 2015-08-29 MED ORDER — ASPIRIN 300 MG RE SUPP: 300.0000 mg | RECTAL | Status: AC

## 2015-08-29 MED ORDER — ASPIRIN EC 81 MG PO TBEC: 81.0000 mg | DELAYED_RELEASE_TABLET | Freq: Every day | ORAL | Status: DC

## 2015-08-29 MED ORDER — VILAZODONE HCL 40 MG PO TABS
40.0000 mg | ORAL_TABLET | Freq: Every day | ORAL | Status: DC
  Administered 2015-08-30: 40 mg via ORAL
  Filled 2015-08-29: qty 1

## 2015-08-29 MED ORDER — TEMAZEPAM 15 MG PO CAPS
15.0000 mg | ORAL_CAPSULE | Freq: Every day | ORAL | Status: DC
  Administered 2015-08-29: 15 mg via ORAL
  Filled 2015-08-29: qty 1

## 2015-08-29 MED ORDER — VITAMIN D 1000 UNITS PO TABS: 1000.0000 [IU] | ORAL_TABLET | Freq: Every day | ORAL | Status: DC

## 2015-08-29 MED ORDER — SENNOSIDES-DOCUSATE SODIUM 8.6-50 MG PO TABS
1.0000 | ORAL_TABLET | Freq: Two times a day (BID) | ORAL | Status: DC
  Administered 2015-08-30: 1 via ORAL
  Filled 2015-08-29: qty 1

## 2015-08-29 MED ORDER — ATORVASTATIN CALCIUM 40 MG PO TABS
40.0000 mg | ORAL_TABLET | Freq: Every day | ORAL | Status: DC
  Administered 2015-08-30: 40 mg via ORAL
  Filled 2015-08-29: qty 1

## 2015-08-29 MED ORDER — VITAMIN D 1000 UNITS PO TABS
1000.0000 [IU] | ORAL_TABLET | Freq: Every day | ORAL | Status: DC
  Administered 2015-08-30: 1000 [IU] via ORAL
  Filled 2015-08-29: qty 1

## 2015-08-29 MED ORDER — ASPIRIN EC 81 MG PO TBEC
81.0000 mg | DELAYED_RELEASE_TABLET | Freq: Every day | ORAL | Status: DC
  Administered 2015-08-30: 81 mg via ORAL
  Filled 2015-08-29: qty 1

## 2015-08-29 MED ORDER — ASPIRIN 81 MG PO CHEW
324.0000 mg | CHEWABLE_TABLET | ORAL | Status: AC
  Administered 2015-08-29: 324 mg via ORAL
  Filled 2015-08-29: qty 4

## 2015-08-29 MED ORDER — NITROGLYCERIN 0.4 MG/HR TD PT24
0.4000 mg | MEDICATED_PATCH | Freq: Every day | TRANSDERMAL | Status: DC
  Filled 2015-08-29 (×3): qty 1

## 2015-08-29 MED ORDER — HEPARIN SODIUM (PORCINE) 5000 UNIT/ML IJ SOLN
5000.0000 [IU] | Freq: Three times a day (TID) | INTRAMUSCULAR | Status: DC
  Administered 2015-08-29 – 2015-08-30 (×2): 5000 [IU] via SUBCUTANEOUS
  Filled 2015-08-29 (×2): qty 1

## 2015-08-29 NOTE — Telephone Encounter (Signed)
[email or web address]
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
## 2015-08-29 NOTE — ED Notes (Signed)
Pt here for chest pain since Tuesday. sts that it feels tight in his chest. sts also a burning to head. Pt no acute distress. No SOB,
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
## 2015-08-29 NOTE — ED Notes (Signed)
[place]urkey sandwich and 2 chocolate milks provided to patient.

## 2015-08-29 NOTE — ED Notes (Signed)
Pt made aware of bed assignment 

## 2015-08-29 NOTE — Telephone Encounter (Signed)
Advised to go to ED to be evaluated and explained reasoning for this need. Patient is agreeable and aware I will notify cardiology cardmaster that he is coming.

## 2015-08-30 ENCOUNTER — Encounter (HOSPITAL_COMMUNITY): Payer: Self-pay | Admitting: General Practice

## 2015-08-30 DIAGNOSIS — I200 Unstable angina: Secondary | ICD-10-CM

## 2015-08-30 LAB — BASIC METABOLIC PANEL
ANION GAP: 8 (ref 5–15)
BUN: 17 mg/dL (ref 6–20)
CALCIUM: 9.1 mg/dL (ref 8.9–10.3)
CO2: 25 mmol/L (ref 22–32)
Chloride: 105 mmol/L (ref 101–111)
Creatinine, Ser: 1.02 mg/dL (ref 0.61–1.24)
Glucose, Bld: 139 mg/dL — ABNORMAL HIGH (ref 65–99)
POTASSIUM: 4.3 mmol/L (ref 3.5–5.1)
Sodium: 138 mmol/L (ref 135–145)

## 2015-08-30 LAB — GLUCOSE, CAPILLARY: Glucose-Capillary: 127 mg/dL — ABNORMAL HIGH (ref 65–99)

## 2015-08-30 LAB — CBC
HEMATOCRIT: 44.7 % (ref 39.0–52.0)
HEMOGLOBIN: 14.9 g/dL (ref 13.0–17.0)
MCH: 29.5 pg (ref 26.0–34.0)
MCHC: 33.3 g/dL (ref 30.0–36.0)
MCV: 88.5 fL (ref 78.0–100.0)
Platelets: 250 10*3/uL (ref 150–400)
RBC: 5.05 MIL/uL (ref 4.22–5.81)
RDW: 13.8 % (ref 11.5–15.5)
WBC: 5.6 10*3/uL (ref 4.0–10.5)

## 2015-08-30 LAB — LIPID PANEL
CHOLESTEROL: 103 mg/dL (ref 0–200)
HDL: 33 mg/dL — AB (ref >40)
LDL Cholesterol: 40 mg/dL (ref 0–99)
TRIGLYCERIDES: 148 mg/dL (ref <150)
Total CHOL/HDL Ratio: 3.1 RATIO
VLDL: 30 mg/dL (ref 0–40)

## 2015-08-30 LAB — TROPONIN I

## 2015-08-30 LAB — HEMOGLOBIN A1C
Hgb A1c MFr Bld: 6.4 % — ABNORMAL HIGH (ref 4.8–5.6)
Mean Plasma Glucose: 137 mg/dL

## 2015-08-30 MED ORDER — ENSURE ENLIVE PO LIQD: 237.0000 mL | Freq: Two times a day (BID) | ORAL | Status: DC

## 2015-08-30 MED ORDER — ACETAMINOPHEN 325 MG PO TABS: 650.0000 mg | ORAL_TABLET | ORAL | Status: AC | PRN

## 2015-08-30 MED ORDER — ISOSORBIDE MONONITRATE ER 30 MG PO TB24: 15.0000 mg | ORAL_TABLET | Freq: Every day | ORAL | Status: DC

## 2015-08-30 MED ORDER — TRAMADOL HCL 50 MG PO TABS: 50.0000 mg | ORAL_TABLET | Freq: Four times a day (QID) | ORAL | Status: DC | PRN

## 2015-08-30 MED ORDER — ENSURE ENLIVE PO LIQD
237.0000 mL | Freq: Two times a day (BID) | ORAL | Status: DC
  Administered 2015-08-30: 237 mL via ORAL

## 2015-08-30 NOTE — Progress Notes (Signed)
SUBJECTIVE:  No chest pain.  No SOB.     PHYSICAL EXAM Filed Vitals:   08/29/15 1800 08/29/15 1845 08/29/15 2028 08/30/15 0513  BP: 130/69 126/68 120/65 129/75  Pulse: 65 66 69 65  Temp:   98.8 F (37.1 C) 97.6 F (36.4 C)  TempSrc:   Oral Oral  Resp: 20  20 18   Height:      Weight:      SpO2: 97% 98% 98% 98%   General:  No distress Lungs:  Clear Heart:  RRR Abdomen:  Positive bowel sounds, no rebound no guarding Extremities:  No edema   LABS: Lab Results  Component Value Date   TROPONINI <0.03 08/30/2015   Results for orders placed or performed during the hospital encounter of 08/29/15 (from the past 24 hour(s))  Basic metabolic panel     Status: Abnormal   Collection Time: 08/29/15 12:18 PM  Result Value Ref Range   Sodium 139 135 - 145 mmol/L   Potassium 4.4 3.5 - 5.1 mmol/L   Chloride 106 101 - 111 mmol/L   CO2 21 (L) 22 - 32 mmol/L   Glucose, Bld 162 (H) 65 - 99 mg/dL   BUN 17 6 - 20 mg/dL   Creatinine, Ser 1.610.93 0.61 - 1.24 mg/dL   Calcium 9.3 8.9 - 09.610.3 mg/dL   GFR calc non Af Amer >60 >60 mL/min   GFR calc Af Amer >60 >60 mL/min   Anion gap 12 5 - 15  CBC     Status: None   Collection Time: 08/29/15 12:18 PM  Result Value Ref Range   WBC 5.4 4.0 - 10.5 K/uL   RBC 5.15 4.22 - 5.81 MIL/uL   Hemoglobin 15.1 13.0 - 17.0 g/dL   HCT 04.545.6 40.939.0 - 81.152.0 %   MCV 88.5 78.0 - 100.0 fL   MCH 29.3 26.0 - 34.0 pg   MCHC 33.1 30.0 - 36.0 g/dL   RDW 91.413.9 78.211.5 - 95.615.5 %   Platelets 267 150 - 400 K/uL  I-stat troponin, ED ([facility])     Status: None   Collection Time: 08/29/15 12:28 PM  Result Value Ref Range   Troponin i, poc 0.00 0.00 - 0.08 ng/mL   Comment 3          Urinalysis, Routine w reflex microscopic ([facility])     Status: None   Collection Time: 08/29/15  2:22 PM  Result Value Ref Range   Color, Urine YELLOW YELLOW   APPearance CLEAR CLEAR   Specific Gravity, Urine 1.007 1.005 - 1.030   pH 5.0 5.0 - 8.0   Glucose, UA NEGATIVE NEGATIVE  mg/dL   Hgb urine dipstick NEGATIVE NEGATIVE   Bilirubin Urine NEGATIVE NEGATIVE   Ketones, ur NEGATIVE NEGATIVE mg/dL   Protein, ur NEGATIVE NEGATIVE mg/dL   Nitrite NEGATIVE NEGATIVE   Leukocytes, UA NEGATIVE NEGATIVE  CBC     Status: None   Collection Time: 08/29/15  6:54 PM  Result Value Ref Range   WBC 5.3 4.0 - 10.5 K/uL   RBC 4.81 4.22 - 5.81 MIL/uL   Hemoglobin 14.0 13.0 - 17.0 g/dL   HCT 21.342.3 08.639.0 - 57.852.0 %   MCV 87.9 78.0 - 100.0 fL   MCH 29.1 26.0 - 34.0 pg   MCHC 33.1 30.0 - 36.0 g/dL   RDW 46.913.8 62.911.5 - 52.815.5 %   Platelets 229 150 - 400 K/uL  Creatinine, serum     Status: None  Collection Time: 08/29/15  6:54 PM  Result Value Ref Range   Creatinine, Ser 1.00 0.61 - 1.24 mg/dL   GFR calc non Af Amer >60 >60 mL/min   GFR calc Af Amer >60 >60 mL/min  TSH     Status: None   Collection Time: 08/29/15  6:54 PM  Result Value Ref Range   TSH 0.541 0.350 - 4.500 uIU/mL  Hemoglobin A1c     Status: Abnormal   Collection Time: 08/29/15  6:54 PM  Result Value Ref Range   Hgb A1c MFr Bld 6.4 (H) 4.8 - 5.6 %   Mean Plasma Glucose 137 mg/dL  Troponin I     Status: None   Collection Time: 08/29/15  6:54 PM  Result Value Ref Range   Troponin I <0.03 <0.031 ng/mL  Glucose, capillary     Status: Abnormal   Collection Time: 08/29/15  9:37 PM  Result Value Ref Range   Glucose-Capillary 113 (H) 65 - 99 mg/dL   Comment 1 Notify RN    Comment 2 Document in Chart   Troponin I     Status: None   Collection Time: 08/30/15 12:45 AM  Result Value Ref Range   Troponin I <0.03 <0.031 ng/mL  Lipid panel     Status: Abnormal   Collection Time: 08/30/15 12:45 AM  Result Value Ref Range   Cholesterol 103 0 - 200 mg/dL   Triglycerides 161 <096 mg/dL   HDL 33 (L) >04 mg/dL   Total CHOL/HDL Ratio 3.1 RATIO   VLDL 30 0 - 40 mg/dL   LDL Cholesterol 40 0 - 99 mg/dL  Glucose, capillary     Status: Abnormal   Collection Time: 08/30/15  6:22 AM  Result Value Ref Range   Glucose-Capillary 127  (H) 65 - 99 mg/dL   Comment 1 Notify RN    Comment 2 Document in Chart   Troponin I     Status: None   Collection Time: 08/30/15  8:03 AM  Result Value Ref Range   Troponin I <0.03 <0.031 ng/mL  Basic metabolic panel     Status: Abnormal   Collection Time: 08/30/15  8:03 AM  Result Value Ref Range   Sodium 138 135 - 145 mmol/L   Potassium 4.3 3.5 - 5.1 mmol/L   Chloride 105 101 - 111 mmol/L   CO2 25 22 - 32 mmol/L   Glucose, Bld 139 (H) 65 - 99 mg/dL   BUN 17 6 - 20 mg/dL   Creatinine, Ser 5.40 0.61 - 1.24 mg/dL   Calcium 9.1 8.9 - 98.1 mg/dL   GFR calc non Af Amer >60 >60 mL/min   GFR calc Af Amer >60 >60 mL/min   Anion gap 8 5 - 15  CBC     Status: None   Collection Time: 08/30/15  8:03 AM  Result Value Ref Range   WBC 5.6 4.0 - 10.5 K/uL   RBC 5.05 4.22 - 5.81 MIL/uL   Hemoglobin 14.9 13.0 - 17.0 g/dL   HCT 19.1 47.8 - 29.5 %   MCV 88.5 78.0 - 100.0 fL   MCH 29.5 26.0 - 34.0 pg   MCHC 33.3 30.0 - 36.0 g/dL   RDW 62.1 30.8 - 65.7 %   Platelets 250 150 - 400 K/uL    Intake/Output Summary (Last 24 hours) at 08/30/15 1021 Last data filed at 08/30/15 0513  Gross per 24 hour  Intake      0 ml  Output   1125  ml  Net  -1125 ml     ASSESSMENT AND PLAN:  CHEST PAIN:    Enzymes negative.  No pain overnight and want to go home.  No change in therapy.    Fayrene Fearing [facility] 08/30/2015 10:21 AM

## 2015-08-30 NOTE — Discharge Instructions (Signed)

## 2015-09-01 ENCOUNTER — Encounter (HOSPITAL_COMMUNITY): Payer: Self-pay

## 2015-09-01 ENCOUNTER — Ambulatory Visit (HOSPITAL_COMMUNITY): Admit: 2015-09-01 | Payer: Self-pay | Admitting: Cardiovascular Disease

## 2015-09-01 SURGERY — LEFT HEART CATH AND CORONARY ANGIOGRAPHY
Anesthesia: LOCAL

## 2015-09-02 DIAGNOSIS — E785 Hyperlipidemia, unspecified: Secondary | ICD-10-CM | POA: Diagnosis present

## 2015-09-02 NOTE — Discharge Summary (Signed)
Discharge Summary    Patient ID: Timothy Cervantes,  MRN: [id number], DOB/AGE: 10/29/1940 75 y.o.  Admit date: 08/29/2015 Discharge date: 08/30/2015 Primary Care Provider: Alysia Penna Primary Cardiologist: Dr Sanjuana Kava  Discharge Diagnoses    Principal Problem:   Unstable angina [facility]) Active Problems:   S/P CABG x 5-  2014   Hypertension   Diabetes mellitus without complication (HCC)   Dyslipidemia   Allergies Allergies  Allergen Reactions  . Latex     Had reaction to latex foley catheter, but ok with gloves  . Morphine And Related Nausea Only  . Neosporin [Neomycin-Polymyxin-Gramicidin] Rash    Diagnostic Studies/Procedures    None _____________   History of Present Illness     75 y/o with CAD and with chest discomfort  Hospital Course     75 y.o. male with a history of CAD s/p 5V CABG March 2014 (LIMA-LAD, SVG-DX, SVG-OM1/OM2, SVG-PDA) , DM, HTN, HLD, former tobacco abuse, who presented to [facility] ED 08/29/15 for evaluation of chest pain.   Last echo 02/28/13 with normal LV size and function, LVEF=55-60%. Metoprolol was stopped October 2014 due to fatigue. Last stress myoview on 06/20/13 which showed no ischemia. He was doing well on cardiac stand point when seen by Dr. Clifton James last 03/17/2015.  On the morning of admission he woke up with a chest tightness in the substernal area with SOB and diaphoresis leading to ER presentation. He had few episodes of diaphoresis and SOB with chest tightness while being triaged. He admits he has been under emotional stress after the death of a close friend. In the ED his EKG showed sinus rhythm at rate of 73 bpm, incomplete RBBB, Q wave in inferior lead. No ST or T wave abnormality compared to prior ekg. POC troponin was negative. He was admitted for further evaluation. His Troponin remained negative. He asked to be discharged the morning of April 1st so he could attend his friends funeral. He had no further chest pain. He was seen  by Dr Antoine Poche and cleared for discharge. He'll follow up as an OP in a week or so.   _____________  Discharge Vitals Blood pressure 129/75, pulse 65, temperature 97.6 F (36.4 C), temperature source Oral, resp. rate 18, height  (1.854 m), weight 215 lb (97.523 kg), SpO2 98 %.  Filed Weights   08/29/15 1214  Weight: 215 lb (97.523 kg)    Labs & Radiologic Studies    _____________  Dg Chest 2 View  08/29/2015  CLINICAL DATA:  Chest pain for 3 days EXAM: CHEST  2 VIEW COMPARISON:  November 16, 2012 FINDINGS: There is no edema or consolidation. The heart size and pulmonary vascularity are normal. No adenopathy. Patient is status post coronary artery bypass grafting. No bone lesions are evident. No pneumothorax. IMPRESSION: No edema or consolidation. Electronically Signed   By: Bretta Bang III M.D.   On: 08/29/2015 12:54   Disposition   Pt is being discharged home 08/30/2015 in good condition.  Follow-up Plans & Appointments    Apt with Dr Sanjuana Kava in June will be moved up   Discharge Medications   Discharge Medication List as of 08/30/2015 10:53 AM    START taking these medications   Details  acetaminophen (TYLENOL) 325 MG tablet Take 2 tablets (650 mg total) by mouth every 4 (four) hours as needed for headache or mild pain., Starting 08/30/2015, Until Discontinued, No Print    feeding supplement, ENSURE ENLIVE, (ENSURE ENLIVE) LIQD Take  237 mLs by mouth 2 (two) times daily between meals., Starting 08/30/2015, Until Discontinued, OTC    isosorbide mononitrate (IMDUR) 30 MG 24 hr tablet Take 0.5 tablets (15 mg total) by mouth daily., Starting 08/30/2015, Until Discontinued, Normal      CONTINUE these medications which have CHANGED   Details  traMADol (ULTRAM) 50 MG tablet Take 1 tablet (50 mg total) by mouth every 6 (six) hours as needed for moderate pain., Starting 08/30/2015, Until Discontinued, Print      CONTINUE these medications which have NOT CHANGED   Details  aspirin  81 MG tablet Take 1 tablet (81 mg total) by mouth daily., Starting 03/17/2015, Until Discontinued, No Print    atorvastatin (LIPITOR) 40 MG tablet Take 40 mg by mouth daily., Starting 07/09/2015, Until Discontinued, Historical Med    buprenorphine (BUTRANS - DOSED MCG/HR) 10 MCG/HR PTWK Place 10 mcg onto the skin once a week., Until Discontinued, Historical Med    buPROPion (WELLBUTRIN SR) 150 MG 12 hr tablet Take 150 mg by mouth daily., Starting 07/24/2015, Until Discontinued, Historical Med    cholecalciferol (VITAMIN D) 1000 UNITS tablet Take 1,000 Units by mouth daily., Until Discontinued, Historical Med    furosemide (LASIX) 20 MG tablet Take 20 mg by mouth daily., Until Discontinued, Historical Med    HYDROcodone-acetaminophen (NORCO) 5-325 MG tablet Take 1-2 tablets by mouth every 6 (six) hours as needed. pain, Starting 07/30/2015, Until Discontinued, Historical Med    metFORMIN (GLUCOPHAGE) 500 MG tablet Take 500 mg by mouth 2 (two) times daily with a meal. , Until Discontinued, Historical Med    nitroGLYCERIN (NITROSTAT) 0.4 MG SL tablet Place 1 tablet (0.4 mg total) under the tongue every 5 (five) minutes as needed for chest pain., Starting 06/04/2013, Until Discontinued, Normal    potassium chloride SA (K-DUR,KLOR-CON) 20 MEQ tablet TAKE 2 TABLETS BY MOUTH EVERY DAY, Normal    sennosides-docusate sodium (SENOKOT-S) 8.6-50 MG tablet Take 1 tablet by mouth 2 (two) times daily., Until Discontinued, Historical Med    temazepam (RESTORIL) 15 MG capsule Take 15 mg by mouth at bedtime., Starting 07/27/2015, Until Discontinued, Historical Med    Vilazodone HCl (VIIBRYD) 40 MG TABS Take 40 mg by mouth daily., Until Discontinued, Historical Med      STOP taking these medications     Buprenorphine (BUTRANS) 15 MCG/HR PTWK          Aspirin prescribed at discharge?  Yes High Intensity Statin Prescribed? (Lipitor 40-80mg  or Crestor 20-40mg ): Yes Beta Blocker Prescribed? No: intolerant For  EF <40%, was ACEI/ARB Prescribed? No: NA ADP Receptor Inhibitor Prescribed? (i.e. Plavix etc.-Includes Medically Managed Patients): No: NA For EF <40%, Aldosterone Inhibitor Prescribed? No: NA Was EF assessed during THIS hospitalization? No: NA Was Cardiac Rehab II ordered? (Included Medically managed Patients): No: NA   Outstanding Labs/Studies     Duration of Discharge Encounter   Greater than 30 minutes including physician time.  Jolene ProvostSigned, Jonathon Castelo K PA 09/02/2015, 8:17 AM

## 2015-09-03 NOTE — Progress Notes (Addendum)
Utilization review completed- post discharge 

## 2015-09-07 ENCOUNTER — Other Ambulatory Visit: Payer: Self-pay | Admitting: Cardiovascular Disease

## 2015-09-08 ENCOUNTER — Other Ambulatory Visit: Payer: Self-pay | Admitting: *Deleted

## 2015-09-08 DIAGNOSIS — M654 Radial styloid tenosynovitis [de Quervain]: Secondary | ICD-10-CM | POA: Diagnosis not present

## 2015-09-08 MED ORDER — FUROSEMIDE 20 MG PO TABS: 20.0000 mg | ORAL_TABLET | Freq: Every day | ORAL | Status: DC

## 2015-09-16 ENCOUNTER — Ambulatory Visit (INDEPENDENT_AMBULATORY_CARE_PROVIDER_SITE_OTHER): Payer: PPO | Admitting: Cardiology

## 2015-09-16 ENCOUNTER — Encounter: Payer: Self-pay | Admitting: Cardiology

## 2015-09-16 VITALS — BP 128/62 | HR 76 | Ht 73.0 in | Wt 224.0 lb

## 2015-09-16 DIAGNOSIS — I2510 Atherosclerotic heart disease of native coronary artery without angina pectoris: Secondary | ICD-10-CM

## 2015-09-16 MED ORDER — FUROSEMIDE 40 MG PO TABS: 40.0000 mg | ORAL_TABLET | Freq: Two times a day (BID) | ORAL | Status: DC

## 2015-09-16 NOTE — Progress Notes (Signed)
09/16/2015 Timothy Cervantes   1941/01/10  [id number]  Primary Physician Alysia PennaHOLWERDA, SCOTT, MD Primary Cardiologist: Dr. Clifton JamesMcAlhany   [facility] Visit/CC: [facility]  HPI:  The patient is a 75 year old male, followed by Dr. Clifton JamesMcAlhany, with a history of CAD s/p 5V CABG March 2014 (LIMA-LAD, SVG-DX, SVG-OM1/OM2, SVG-PDA) , DM, HTN, HLD and former tobacco abuse. Last echo 02/28/13 with normal LV size and function, LVEF=55-60%. Metoprolol was stopped October 2014 due to fatigue. Last stress myoview on 06/20/13 which showed no ischemia. He was doing well on cardiac stand point when seen by Dr. Clifton JamesMcAlhany last 03/17/2015.  [facility] for evaluation of chest pain. He complained of substernal chest tightness associated with dyspnea and diaphoresis. He had admitted to being under emotional stress after the death of a close friend. In the ED his EKG showed sinus rhythm at rate of 73 bpm, incomplete RBBB, Q wave in inferior lead. No ST or T wave abnormality compared to prior ekg. POC troponin was negative. He was admitted for further evaluation. His Troponin remained negative x 3. FLP was obtained which showed LDL to be at goal at 40 mg/dL. Hemoglobin A1c was also at goal at 6.4. He asked to be discharged the morning of April 1st so he could attend his friends funeral. He had no further chest pain. He was seen by Dr Antoine PocheHochrein and cleared for discharge. He now presents back to clinic for post hospital follow-up.  Today in clinic, he reports that he has done well. He denies any recurrent chest discomfort. He has not had to use nitroglycerin tablets. In retrospect, he believes that he just overexerted himself the days leading up to his hospitalization. He mentions that he was chopping a lot of wood and working hard throughout the day with little sleep. Since then he has had no recurrent symptoms. In fact he was out in his yard yesterday chopping more wood and  denies any exertional chest pain or dyspnea. He reports full medication compliance however he does report that he did not fill his prescription for Imdur after discharge as he felt that he did not need it.  His blood pressure today in clinic is well-controlled at 128/62. He is CP free.    Current Outpatient Prescriptions  Medication Sig Dispense Refill  . acetaminophen (TYLENOL) 325 MG tablet Take 2 tablets (650 mg total) by mouth every 4 (four) hours as needed for headache or mild pain.    [name]. aspirin 81 MG tablet Take 1 tablet (81 mg total) by mouth daily.    [name]. atorvastatin (LIPITOR) 40 MG tablet Take 40 mg by mouth daily.  4  . cholecalciferol (VITAMIN D) 1000 UNITS tablet Take 1,000 Units by mouth daily.    . furosemide (LASIX) 40 MG tablet TAKE 1 TABLET BY MOUTH TWICE DAILY 180 tablet 0  . metFORMIN (GLUCOPHAGE) 500 MG tablet Take 500 mg by mouth 2 (two) times daily with a meal.     . nitroGLYCERIN (NITROSTAT) 0.4 MG SL tablet Place 1 tablet (0.4 mg total) under the tongue every 5 (five) minutes as needed for chest pain. 25 tablet 6  . potassium chloride SA (K-DUR,KLOR-CON) 20 MEQ tablet TAKE 2 TABLETS BY MOUTH EVERY DAY 180 tablet 1  . sennosides-docusate sodium (SENOKOT-S) 8.6-50 MG tablet Take 1 tablet by mouth 2 (two) times daily.    . temazepam (RESTORIL) 15 MG capsule Take 15 mg by mouth at bedtime.    .[name]  Vilazodone HCl (VIIBRYD) 40 MG TABS Take 40 mg by mouth daily.    . buprenorphine (BUTRANS - DOSED MCG/HR) 10 MCG/HR PTWK Place 10 mcg onto the skin once a week.     No current facility-administered medications for this visit.    Allergies  Allergen Reactions  . Latex     Had reaction to latex foley catheter, but ok with gloves  . Morphine And Related Nausea Only  . Neosporin [Neomycin-Polymyxin-Gramicidin] Rash    Social History   Social History  . Marital Status: Married    Spouse Name: N/A  . Number of Children: N/A  . Years of Education: N/A   Occupational History    . Not on file.   Social History Main Topics  . Smoking status: Former Smoker -- 1.00 packs/day for 57 years    Types: Cigarettes  . Smokeless tobacco: Former Neurosurgeon    Types: Chew     Comment: smoked from the age of 69.  Quit 03/2012  . Alcohol Use: Yes     Comment: social  . Drug Use: No  . Sexual Activity: Not on file   Other Topics Concern  . Not on file   Social History Narrative   Lives in [place] with wife.  Retired.     Review of Systems: General: negative for chills, fever, night sweats or weight changes.  Cardiovascular: negative for chest pain, dyspnea on exertion, edema, orthopnea, palpitations, paroxysmal nocturnal dyspnea or shortness of breath Dermatological: negative for rash Respiratory: negative for cough or wheezing Urologic: negative for hematuria Abdominal: negative for nausea, vomiting, diarrhea, bright red blood per rectum, melena, or hematemesis Neurologic: negative for visual changes, syncope, or dizziness All other systems reviewed and are otherwise negative except as noted above.    Blood pressure 128/62, pulse 76, height  (1.854 m), weight 224 lb (101.606 kg).  General appearance: alert, cooperative and no distress Neck: no carotid bruit and no JVD Lungs: clear to auscultation bilaterally Heart: regular rate and rhythm, S1, S2 normal, no murmur, click, rub or gallop Extremities: no LEE Pulses: 2+ and symmetric Skin: warm and dry Neurologic: Grossly normal  EKG not performed  ASSESSMENT AND PLAN:   1. CAD: s/p 5V CABG March 2014 (LIMA-LAD, SVG-DX, SVG-OM1/OM2, SVG-PDA). Myoview January 2015 was negative for ischemia. Recent CP but r/o for MI with negative enzymes.   2. Chest pain: Patient was recently admitted to [facility] for rule out, in the setting of chest pain and known history of CAD. He did rule out for myocardial infarction with negative cardiac enzymes 3. However he was discharged from the hospital before any additional  workup could be completed due to personal needs as outlined above (needed to attend a funeral). Since discharge, he denies any further recurrence. In retrospect, he believes that he over worked himself the days leading up to his hospitalization. He has not required any use of nitroglycerin. He was outside chopping wood yesterday and denies any exertional chest pain or dyspnea. For this reason, we will not pursue repeat nuclear stress testing at this time. We will continue medical therapy.  3. HTN: BP is well controlled at 128/62.   4. HLD: Well controlled with Lipitor. Recent fasting lipid panel during recent hospital admission demonstrated LDL to be at goal at 40 mg/dL.  5. DM: Well controlled. Recent hemoglobin A1c which was checked during recent hospitalization was at goal at 6.4.   PLAN  Keep f/u appt with Dr. Clifton James in June.  Robbie Lis PA-C 09/16/2015 8:49 AM

## 2015-09-16 NOTE — Patient Instructions (Addendum)
Medication Instructions:  Your physician recommends that you continue on your current medications as directed. Please refer to the Current Medication list given to you today.   Labwork: None ordered  Testing/Procedures: None ordered  Follow-Up: Your physician recommends that you schedule a follow-up appointment in: KEEP YOUR SCHEDULED APPOINTMENT   Any Other Special Instructions Will Be Listed Below (If Applicable).     If you need a refill on your cardiac medications before your next appointment, please call your pharmacy.   

## 2015-10-07 DIAGNOSIS — Z125 Encounter for screening for malignant neoplasm of prostate: Secondary | ICD-10-CM | POA: Diagnosis not present

## 2015-10-07 DIAGNOSIS — E784 Other hyperlipidemia: Secondary | ICD-10-CM | POA: Diagnosis not present

## 2015-10-07 DIAGNOSIS — E119 Type 2 diabetes mellitus without complications: Secondary | ICD-10-CM | POA: Diagnosis not present

## 2015-10-12 DIAGNOSIS — L237 Allergic contact dermatitis due to plants, except food: Secondary | ICD-10-CM | POA: Diagnosis not present

## 2015-10-12 DIAGNOSIS — E119 Type 2 diabetes mellitus without complications: Secondary | ICD-10-CM | POA: Diagnosis not present

## 2015-10-14 DIAGNOSIS — G4730 Sleep apnea, unspecified: Secondary | ICD-10-CM | POA: Diagnosis not present

## 2015-10-14 DIAGNOSIS — Z0000 Encounter for general adult medical examination without abnormal findings: Secondary | ICD-10-CM | POA: Diagnosis not present

## 2015-10-14 DIAGNOSIS — Z1212 Encounter for screening for malignant neoplasm of rectum: Secondary | ICD-10-CM | POA: Diagnosis not present

## 2015-10-14 DIAGNOSIS — G4700 Insomnia, unspecified: Secondary | ICD-10-CM | POA: Diagnosis not present

## 2015-10-14 DIAGNOSIS — E784 Other hyperlipidemia: Secondary | ICD-10-CM | POA: Diagnosis not present

## 2015-10-14 DIAGNOSIS — M545 Low back pain: Secondary | ICD-10-CM | POA: Diagnosis not present

## 2015-10-14 DIAGNOSIS — Z6829 Body mass index (BMI) 29.0-29.9, adult: Secondary | ICD-10-CM | POA: Diagnosis not present

## 2015-10-14 DIAGNOSIS — I25118 Atherosclerotic heart disease of native coronary artery with other forms of angina pectoris: Secondary | ICD-10-CM | POA: Diagnosis not present

## 2015-10-14 DIAGNOSIS — M797 Fibromyalgia: Secondary | ICD-10-CM | POA: Diagnosis not present

## 2015-10-14 DIAGNOSIS — E119 Type 2 diabetes mellitus without complications: Secondary | ICD-10-CM | POA: Diagnosis not present

## 2015-10-14 DIAGNOSIS — I10 Essential (primary) hypertension: Secondary | ICD-10-CM | POA: Diagnosis not present

## 2015-10-14 DIAGNOSIS — Z1389 Encounter for screening for other disorder: Secondary | ICD-10-CM | POA: Diagnosis not present

## 2015-10-17 DIAGNOSIS — L237 Allergic contact dermatitis due to plants, except food: Secondary | ICD-10-CM | POA: Diagnosis not present

## 2015-10-17 DIAGNOSIS — E119 Type 2 diabetes mellitus without complications: Secondary | ICD-10-CM | POA: Diagnosis not present

## 2015-11-03 ENCOUNTER — Encounter: Payer: Self-pay | Admitting: Cardiovascular Disease

## 2015-11-03 ENCOUNTER — Ambulatory Visit (INDEPENDENT_AMBULATORY_CARE_PROVIDER_SITE_OTHER): Payer: PPO | Admitting: Cardiovascular Disease

## 2015-11-03 VITALS — BP 140/60 | HR 72 | Ht 74.0 in | Wt 220.0 lb

## 2015-11-03 DIAGNOSIS — I25810 Atherosclerosis of coronary artery bypass graft(s) without angina pectoris: Secondary | ICD-10-CM | POA: Diagnosis not present

## 2015-11-03 DIAGNOSIS — E785 Hyperlipidemia, unspecified: Secondary | ICD-10-CM

## 2015-11-03 DIAGNOSIS — I10 Essential (primary) hypertension: Secondary | ICD-10-CM | POA: Diagnosis not present

## 2015-11-03 DIAGNOSIS — I5032 Chronic diastolic (congestive) heart failure: Secondary | ICD-10-CM | POA: Diagnosis not present

## 2015-11-03 NOTE — Progress Notes (Signed)
Chief Complaint  Patient presents with  . Hypertension    denies cp,sob,lee,or claudication     History of Present Illness: 75 y.o. male with history of CAD s/p 5V CABG March 2014, DM, HTN, HLD, former tobacco abuse, OSA who is here today for cardiac follow up. He was [facility] 3/26-08/31/12 with a NSTEMI. Cardiac cath 08/23/12 demonstrated severe 3 vessel CAD with preserved LV function. Echo 08/24/12: Mild LVH, EF 65-70%, mild RVE, PASP 36. Pre-op dopplers with no carotid stenosis. 5V CABG 08/25/12 (LIMA-LAD, SVG-DX, SVG-OM1/OM2, SVG-PDA). Echo 02/28/13 with normal LV size and function, LVEF=55-60%. Metoprolol was stopped October 2014 due to fatigue. I saw him 06/04/13 and he had c/o right sided chest pain and jaw pain. This occurred after a long weekend of manual labor building a deck. No recurrence since then. I arranged a stress myoview on 06/20/13 which showed no ischemia. He exercised for 7 minutes without chest pain. Admitted overnight 08/29/15 with chest pain after heavy exertion. Negative troponin. No ischemic evaluation was performed. Seen in our office for follow up April 2017 with no recurrence of chest pain.   He is here today for follow up. He denies chest pain. He has no fatigue or dyspnea. He has been fishing and working on projects around his house.   Primary Care Physician: Alysia PennaHOLWERDA, SCOTT, MD   Past Medical History  Diagnosis Date  . Back pain, chronic   . DJD (degenerative joint disease)   . History of tobacco abuse     a. 57 year hx, quit 03/2012.  [name]. Chronic pain   . Fibromyalgia   . CAD (coronary artery disease)     a. s/p NSTEMI 07/2012 => LHC with 3v CAD => s/p CABG 08/25/12 (LIMA-LAD, SVG-DX, SVG-OM1/OM2, SVG-PDA) Dr. Tyrone SageGerhardt;  b.  Echo 08/24/12:  Mild LVH, EF 65-70%, mild RVE, PASP 36  . Chronic back pain   . HLD (hyperlipidemia)   . Shortness of breath dyspnea     with extertion  . GERD (gastroesophageal reflux disease)   . Complication of anesthesia     hard  to wake up  . Osteoarthritis   . Depression   . Fatty liver   . Sleep apnea     uses CPAP every night  . Sleep apnea   . Anxiety   . Hypertension   . Type II diabetes mellitus (HCC) a. Dx in 2010  . NSTEMI (non-ST elevated myocardial infarction) (HCC) 07/2012    Past Surgical History  Procedure Laterality Date  . Right ankle surgery    . Cholecystectomy    . Rotator cuff repair Bilateral   . Transurethral resection of prostate    . Vocal cord tumor resection    . Parotidectomy Left     parotid gland resection-non cancerous  . Coronary artery bypass graft N/A 08/25/2012    Procedure: CORONARY ARTERY BYPASS GRAFTING (CABG);  Surgeon: Delight OvensEdward B Gerhardt, MD;  [facility];  Service: Open Heart Surgery;  Laterality: N/A;  Coronary artery bypass graft times five using left internal mammary artery and bilateral saphenous leg vein using endoscope.  . Intraoperative transesophageal echocardiogram N/A 08/25/2012    Procedure: INTRAOPERATIVE TRANSESOPHAGEAL ECHOCARDIOGRAM;  Surgeon: Delight OvensEdward B Gerhardt, MD;  [facility];  Service: Open Heart Surgery;  Laterality: N/A;  . Left heart catheterization with coronary angiogram N/A 08/23/2012    Procedure: LEFT HEART CATHETERIZATION WITH CORONARY ANGIOGRAM;  Surgeon: Kathleene Hazelhristopher D McAlhany, MD;  [facility];  Service: Cardiovascular;  Laterality: N/A;  . Shoulder surgery Bilateral   . Nasal sinus surgery    . Trigger finger release Left 06/24/2015    Procedure: RELEASE A-1 PULLEY LEFT LONG AND LEFT RING FINGERS;  Surgeon: Cindee Salt, MD;  Location: [facility];  Service: Orthopedics;  Laterality: Left;  . Trigger finger release Right 07/29/2015    Procedure: RELEASE A-1 PULLEY RIGHT INDEX FINGER, RIGHT MIDDLE FINGER, RIGHT RING FINGER, RIGHT SMALL FINGER;  Surgeon: Cindee Salt, MD;  Location: [facility];  Service: Orthopedics;  Laterality: Right;    Current Outpatient Prescriptions  Medication Sig Dispense  Refill  . acetaminophen (TYLENOL) 325 MG tablet Take 2 tablets (650 mg total) by mouth every 4 (four) hours as needed for headache or mild pain.    [name] aspirin 81 MG tablet Take 1 tablet (81 mg total) by mouth daily.    [name] atorvastatin (LIPITOR) 40 MG tablet Take 40 mg by mouth daily.  4  . Buprenorphine (BUTRANS) 15 MCG/HR PTWK Place 1 patch onto the skin once a week.    [name] buPROPion (WELLBUTRIN SR) 150 MG 12 hr tablet Take 1 tablet by mouth every morning.  3  . cholecalciferol (VITAMIN D) 1000 UNITS tablet Take 1,000 Units by mouth daily.    . furosemide (LASIX) 40 MG tablet Take 1 tablet (40 mg total) by mouth 2 (two) times daily. 180 tablet 0  . metFORMIN (GLUCOPHAGE) 500 MG tablet Take 500 mg by mouth 2 (two) times daily with a meal.     . nitroGLYCERIN (NITROSTAT) 0.4 MG SL tablet Place 1 tablet (0.4 mg total) under the tongue every 5 (five) minutes as needed for chest pain. 25 tablet 6  . pantoprazole (PROTONIX) 40 MG tablet Take 1 tablet by mouth daily.  4  . potassium chloride SA (K-DUR,KLOR-CON) 20 MEQ tablet TAKE 2 TABLETS BY MOUTH EVERY DAY 180 tablet 1  . sennosides-docusate sodium (SENOKOT-S) 8.6-50 MG tablet Take 1 tablet by mouth 2 (two) times daily.    . temazepam (RESTORIL) 15 MG capsule Take 15 mg by mouth at bedtime.    . Vilazodone HCl (VIIBRYD) 40 MG TABS Take 40 mg by mouth daily.     No current facility-administered medications for this visit.    Allergies  Allergen Reactions  . Latex     Had reaction to latex foley catheter, but ok with gloves  . Morphine And Related Nausea Only  . Neosporin [Neomycin-Polymyxin-Gramicidin] Rash    Social History   Social History  . Marital Status: Married    Spouse Name: N/A  . Number of Children: N/A  . Years of Education: N/A   Occupational History  . Not on file.   Social History Main Topics  . Smoking status: Former Smoker -- 1.00 packs/day for 57 years    Types: Cigarettes  . Smokeless tobacco: Former Neurosurgeon    Types:  Chew     Comment: smoked from the age of 14.  Quit 03/2012  . Alcohol Use: Yes     Comment: social  . Drug Use: No  . Sexual Activity: Not on file   Other Topics Concern  . Not on file   Social History Narrative   Lives in [place] with wife.  Retired.    Family History  Problem Relation Age of Onset  . Heart attack Father     died @ 67  . Lung cancer Mother     died @ 73  . Other Sister  [employer] in her 70's  . Bladder Cancer Brother     alive    Review of Systems:  As stated in the HPI and otherwise negative.   BP 140/60 mmHg  Pulse 72  Ht 6\' 2"  (1.88 m)  Wt 220 lb (99.791 kg)  BMI 28.23 kg/m2  Physical Examination: General: Well developed, well nourished, NAD HEENT: OP clear, mucus membranes moist SKIN: warm, dry. No rashes. Neuro: No focal deficits Musculoskeletal: Muscle strength 5/5 all ext Psychiatric: Mood and affect normal Neck: No JVD, no carotid bruits, no thyromegaly, no lymphadenopathy. Lungs:Clear bilaterally, no wheezes, rhonci, crackles Cardiovascular: Regular rate and rhythm. No murmurs, gallops or rubs. Abdomen:Soft. Bowel sounds present. Non-tender.  Extremities: No lower extremity edema. Pulses are 2 + in the bilateral DP/PT.  Exercise stress myoview 06/20/13: Stress Procedure: The patient exercised on the treadmill utilizing the Bruce Protocol for 7:00 minutes. The patient stopped due to fatigue and denied any chest pain. Technetium 22m Sestamibi was injected at peak exercise and myocardial perfusion imaging was performed after a brief delay.  Stress ECG: No significant change from baseline ECG  QPS  Raw Data Images: Normal; no motion artifact; normal heart/lung ratio.  Stress Images: Normal homogeneous uptake in all areas of the myocardium.  Rest Images: Normal homogeneous uptake in all areas of the myocardium.  Subtraction (SDS): No evidence of ischemia.  Transient Ischemic Dilatation (Normal <1.22): 0.95  Lung/Heart Ratio (Normal <0.45): 0.34    Quantitative Gated Spect Images  QGS EDV: 114 ml  QGS ESV: 52 ml  Impression  Exercise Capacity: Good exercise capacity.  BP Response: Normal blood pressure response.  Clinical Symptoms: No symptoms.  ECG Impression: No significant ST segment change suggestive of ischemia.  Comparison with Prior Nuclear Study: No images to compare  Overall Impression: Normal stress nuclear study.  LV Ejection Fraction: 54%. LV Wall Motion: NL LV Function; NL Wall Motion   EKG:  EKG is not ordered today. The ekg ordered today demonstrates   Recent Labs: 08/29/2015: TSH 0.541 08/30/2015: BUN 17; Creatinine, Ser 1.02; Hemoglobin 14.9; Platelets 250; Potassium 4.3; Sodium 138     Wt Readings from Last 3 Encounters:  11/03/15 220 lb (99.791 kg)  09/16/15 224 lb (101.606 kg)  08/29/15 215 lb (97.523 kg)     Other studies Reviewed: Additional studies/ records that were reviewed today include: . Review of the above records demonstrates:    Assessment and Plan:   1. CAD: Stable. No recent exertional chest pains to suggest unstable angina. Stress test 06/20/13 without ischemia. He is on an ASA, statin. He has been off of beta blocker due to fatigue.   2. Chronic diastolic CHF: LE edema is stable. Continue Lasix 40 mg po BID  3. HTN: BP stable. No changes  4. Hyperlipidemia: On statin. Lipids followed in primary care.   Current medicines are reviewed at length with the patient today.  The patient does not have concerns regarding medicines.  The following changes have been made:  no change  Labs/ tests ordered today include:  No orders of the defined types were placed in this encounter.    Disposition:   FU with me in 6 months  Signed, Verne Carrow, MD 11/03/2015 5:12 PM    [facility] [address] [place], [place], [place]  [address] Phone: [phone]; Fax: [phone]

## 2015-11-03 NOTE — Patient Instructions (Signed)

## 2015-11-24 ENCOUNTER — Other Ambulatory Visit: Payer: Self-pay | Admitting: Cardiovascular Disease

## 2015-12-11 ENCOUNTER — Other Ambulatory Visit: Payer: Self-pay | Admitting: Cardiovascular Disease

## 2016-02-06 DIAGNOSIS — I25118 Atherosclerotic heart disease of native coronary artery with other forms of angina pectoris: Secondary | ICD-10-CM | POA: Diagnosis not present

## 2016-02-06 DIAGNOSIS — Z6828 Body mass index (BMI) 28.0-28.9, adult: Secondary | ICD-10-CM | POA: Diagnosis not present

## 2016-02-06 DIAGNOSIS — I10 Essential (primary) hypertension: Secondary | ICD-10-CM | POA: Diagnosis not present

## 2016-02-06 DIAGNOSIS — E119 Type 2 diabetes mellitus without complications: Secondary | ICD-10-CM | POA: Diagnosis not present

## 2016-02-06 DIAGNOSIS — E784 Other hyperlipidemia: Secondary | ICD-10-CM | POA: Diagnosis not present

## 2016-02-06 DIAGNOSIS — M545 Low back pain: Secondary | ICD-10-CM | POA: Diagnosis not present

## 2016-02-06 DIAGNOSIS — Z23 Encounter for immunization: Secondary | ICD-10-CM | POA: Diagnosis not present

## 2016-04-06 DIAGNOSIS — M4316 Spondylolisthesis, lumbar region: Secondary | ICD-10-CM | POA: Diagnosis not present

## 2016-04-06 DIAGNOSIS — I10 Essential (primary) hypertension: Secondary | ICD-10-CM | POA: Diagnosis not present

## 2016-04-06 DIAGNOSIS — Z6828 Body mass index (BMI) 28.0-28.9, adult: Secondary | ICD-10-CM | POA: Diagnosis not present

## 2016-04-06 DIAGNOSIS — M5416 Radiculopathy, lumbar region: Secondary | ICD-10-CM | POA: Diagnosis not present

## 2016-04-08 DIAGNOSIS — M5126 Other intervertebral disc displacement, lumbar region: Secondary | ICD-10-CM | POA: Diagnosis not present

## 2016-04-08 DIAGNOSIS — Z6828 Body mass index (BMI) 28.0-28.9, adult: Secondary | ICD-10-CM | POA: Diagnosis not present

## 2016-04-08 DIAGNOSIS — M4316 Spondylolisthesis, lumbar region: Secondary | ICD-10-CM | POA: Diagnosis not present

## 2016-04-08 DIAGNOSIS — M48061 Spinal stenosis, lumbar region without neurogenic claudication: Secondary | ICD-10-CM | POA: Diagnosis not present

## 2016-04-08 DIAGNOSIS — I10 Essential (primary) hypertension: Secondary | ICD-10-CM | POA: Diagnosis not present

## 2016-04-09 DIAGNOSIS — M5126 Other intervertebral disc displacement, lumbar region: Secondary | ICD-10-CM | POA: Diagnosis not present

## 2016-04-09 DIAGNOSIS — M5116 Intervertebral disc disorders with radiculopathy, lumbar region: Secondary | ICD-10-CM | POA: Diagnosis not present

## 2016-04-21 DIAGNOSIS — M5126 Other intervertebral disc displacement, lumbar region: Secondary | ICD-10-CM | POA: Diagnosis not present

## 2016-05-11 DIAGNOSIS — M5136 Other intervertebral disc degeneration, lumbar region: Secondary | ICD-10-CM | POA: Diagnosis not present

## 2016-05-11 DIAGNOSIS — E119 Type 2 diabetes mellitus without complications: Secondary | ICD-10-CM | POA: Diagnosis not present

## 2016-05-11 DIAGNOSIS — Z6829 Body mass index (BMI) 29.0-29.9, adult: Secondary | ICD-10-CM | POA: Diagnosis not present

## 2016-05-11 DIAGNOSIS — I25118 Atherosclerotic heart disease of native coronary artery with other forms of angina pectoris: Secondary | ICD-10-CM | POA: Diagnosis not present

## 2016-05-11 DIAGNOSIS — I10 Essential (primary) hypertension: Secondary | ICD-10-CM | POA: Diagnosis not present

## 2016-05-11 DIAGNOSIS — E784 Other hyperlipidemia: Secondary | ICD-10-CM | POA: Diagnosis not present

## 2016-06-18 ENCOUNTER — Other Ambulatory Visit: Payer: Self-pay | Admitting: Cardiovascular Disease

## 2016-07-19 DIAGNOSIS — H2513 Age-related nuclear cataract, bilateral: Secondary | ICD-10-CM | POA: Diagnosis not present

## 2016-07-19 DIAGNOSIS — E119 Type 2 diabetes mellitus without complications: Secondary | ICD-10-CM | POA: Diagnosis not present

## 2016-07-19 DIAGNOSIS — H52203 Unspecified astigmatism, bilateral: Secondary | ICD-10-CM | POA: Diagnosis not present

## 2016-07-23 ENCOUNTER — Encounter: Payer: Self-pay | Admitting: Cardiovascular Disease

## 2016-07-23 ENCOUNTER — Encounter (INDEPENDENT_AMBULATORY_CARE_PROVIDER_SITE_OTHER): Payer: Self-pay

## 2016-07-23 ENCOUNTER — Ambulatory Visit (INDEPENDENT_AMBULATORY_CARE_PROVIDER_SITE_OTHER): Payer: PPO | Admitting: Cardiovascular Disease

## 2016-07-23 VITALS — BP 130/72 | HR 68 | Ht 73.0 in | Wt 225.8 lb

## 2016-07-23 DIAGNOSIS — I10 Essential (primary) hypertension: Secondary | ICD-10-CM | POA: Diagnosis not present

## 2016-07-23 DIAGNOSIS — I25810 Atherosclerosis of coronary artery bypass graft(s) without angina pectoris: Secondary | ICD-10-CM | POA: Diagnosis not present

## 2016-07-23 DIAGNOSIS — I5032 Chronic diastolic (congestive) heart failure: Secondary | ICD-10-CM

## 2016-07-23 DIAGNOSIS — E7800 Pure hypercholesterolemia, unspecified: Secondary | ICD-10-CM

## 2016-07-23 NOTE — Patient Instructions (Signed)
Medication Instructions:  Your physician recommends that you continue on your current medications as directed. Please refer to the Current Medication list given to you today.   Labwork: none  Testing/Procedures: none  Follow-Up: Your physician recommends that you schedule a follow-up appointment in: 12 months. Please call our office in 9 months to schedule this appointment    Any Other Special Instructions Will Be Listed Below (If Applicable).     If you need a refill on your cardiac medications before your next appointment, please call your pharmacy.   

## 2016-07-23 NOTE — Progress Notes (Signed)
Chief Complaint  Patient presents with  . Follow-up     History of Present Illness: 76 y.o. male with history of CAD s/p 5V CABG March 2014, DM, HTN, HLD, former tobacco abuse, OSA who is here today for cardiac follow up. He was admitted to [facility] March 2014 with a NSTEMI. Cardiac cath 08/23/12 demonstrated severe 3 vessel CAD with preserved LV function. Echo 08/24/12 with normal LV function, no valve disease. Pre-op dopplers with no carotid stenosis. 5V CABG 08/25/12 (LIMA-LAD, SVG-DX, SVG-OM1/OM2, SVG-PDA). Metoprolol was stopped October 2014 due to fatigue. I saw him 06/04/13 and he had c/o right sided chest pain and jaw pain. This occurred after a long weekend of manual labor building a deck. I arranged a stress myoview on 06/20/13 which showed no ischemia. He exercised for 7 minutes without chest pain.  He is here today for follow up. He denies chest pain. He has no fatigue or dyspnea.  He has been fishing regularly.   Primary Care Physician: Alysia Penna, MD   Past Medical History:  Diagnosis Date  . Anxiety   . Back pain, chronic   . CAD (coronary artery disease)    a. s/p NSTEMI 07/2012 => LHC with 3v CAD => s/p CABG 08/25/12 (LIMA-LAD, SVG-DX, SVG-OM1/OM2, SVG-PDA) Dr. Tyrone Sage;  b.  Echo 08/24/12:  Mild LVH, EF 65-70%, mild RVE, PASP 36  . Chronic back pain   . Chronic pain   . Complication of anesthesia    hard to wake up  . Depression   . DJD (degenerative joint disease)   . Fatty liver   . Fibromyalgia   . GERD (gastroesophageal reflux disease)   . History of tobacco abuse    a. 57 year hx, quit 03/2012.  [name] HLD (hyperlipidemia)   . Hypertension   . NSTEMI (non-ST elevated myocardial infarction) (HCC) 07/2012  . Osteoarthritis   . Shortness of breath dyspnea    with extertion  . Sleep apnea    uses CPAP every night  . Sleep apnea   . Type II diabetes mellitus (HCC) a. Dx in 2010    Past Surgical History:  Procedure Laterality Date  . CHOLECYSTECTOMY    . CORONARY  ARTERY BYPASS GRAFT N/A 08/25/2012   Procedure: CORONARY ARTERY BYPASS GRAFTING (CABG);  Surgeon: Delight Ovens, MD;  Location: [facility];  Service: Open Heart Surgery;  Laterality: N/A;  Coronary artery bypass graft times five using left internal mammary artery and bilateral saphenous leg vein using endoscope.  . INTRAOPERATIVE TRANSESOPHAGEAL ECHOCARDIOGRAM N/A 08/25/2012   Procedure: INTRAOPERATIVE TRANSESOPHAGEAL ECHOCARDIOGRAM;  Surgeon: Delight Ovens, MD;  Location: [facility] OR;  Service: Open Heart Surgery;  Laterality: N/A;  . LEFT HEART CATHETERIZATION WITH CORONARY ANGIOGRAM N/A 08/23/2012   Procedure: LEFT HEART CATHETERIZATION WITH CORONARY ANGIOGRAM;  Surgeon: Kathleene Hazel, MD;  Location: [facility] CATH LAB;  Service: Cardiovascular;  Laterality: N/A;  . NASAL SINUS SURGERY    . PAROTIDECTOMY Left    parotid gland resection-non cancerous  . Right Ankle Surgery    . ROTATOR CUFF REPAIR Bilateral   . SHOULDER SURGERY Bilateral   . TRANSURETHRAL RESECTION OF PROSTATE    . TRIGGER FINGER RELEASE Left 06/24/2015   Procedure: RELEASE A-1 PULLEY LEFT LONG AND LEFT RING FINGERS;  Surgeon: Cindee Salt, MD;  Location: [facility];  Service: Orthopedics;  Laterality: Left;  . TRIGGER FINGER RELEASE Right 07/29/2015   Procedure: RELEASE A-1 PULLEY RIGHT INDEX FINGER, RIGHT MIDDLE FINGER, RIGHT RING FINGER,  RIGHT SMALL FINGER;  Surgeon: Cindee SaltGary Kuzma, MD;  Location: [facility];  Service: Orthopedics;  Laterality: Right;  [name]. Vocal Cord Tumor resection      Current Outpatient Prescriptions  Medication Sig Dispense Refill  . acetaminophen (TYLENOL) 325 MG tablet Take 2 tablets (650 mg total) by mouth every 4 (four) hours as needed for headache or mild pain.    [name]. aspirin 81 MG tablet Take 1 tablet (81 mg total) by mouth daily.    [name]. atorvastatin (LIPITOR) 40 MG tablet Take 40 mg by mouth daily.  4  . Buprenorphine (BUTRANS) 15 MCG/HR PTWK Place 1 patch onto the skin once a  week.    [name]. buPROPion (WELLBUTRIN SR) 150 MG 12 hr tablet Take 1 tablet by mouth every morning.  3  . cholecalciferol (VITAMIN D) 1000 UNITS tablet Take 1,000 Units by mouth daily.    . furosemide (LASIX) 40 MG tablet TAKE 1 TABLET BY MOUTH TWICE DAILY 180 tablet 1  . metFORMIN (GLUCOPHAGE) 500 MG tablet Take 500 mg by mouth 2 (two) times daily with a meal.     . nitroGLYCERIN (NITROSTAT) 0.4 MG SL tablet Place 1 tablet (0.4 mg total) under the tongue every 5 (five) minutes as needed for chest pain. 25 tablet 6  . pantoprazole (PROTONIX) 40 MG tablet Take 1 tablet by mouth daily.  4  . potassium chloride SA (K-DUR,KLOR-CON) 20 MEQ tablet TAKE 2 TABLETS BY MOUTH EVERY DAY 180 tablet 1  . potassium chloride SA (K-DUR,KLOR-CON) 20 MEQ tablet TAKE 2 TABLETS BY MOUTH EVERY DAY 60 tablet 11  . sennosides-docusate sodium (SENOKOT-S) 8.6-50 MG tablet Take 1 tablet by mouth 2 (two) times daily.    . temazepam (RESTORIL) 15 MG capsule Take 15 mg by mouth at bedtime.    . Vilazodone HCl (VIIBRYD) 40 MG TABS Take 40 mg by mouth daily.     No current facility-administered medications for this visit.     Allergies  Allergen Reactions  . Latex     Had reaction to latex foley catheter, but ok with gloves  . Morphine And Related Nausea Only  . Neosporin [Neomycin-Polymyxin-Gramicidin] Rash    Social History   Social History  . Marital status: Married    Spouse name: N/A  . Number of children: N/A  . Years of education: N/A   Occupational History  . Not on file.   Social History Main Topics  . Smoking status: Former Smoker    Packs/day: 1.00    Years: 57.00    Types: Cigarettes  . Smokeless tobacco: Former NeurosurgeonUser    Types: Chew     Comment: smoked from the age of 76.  Quit 03/2012  . Alcohol use Yes     Comment: social  . Drug use: No  . Sexual activity: Not on file   Other Topics Concern  . Not on file   Social History Narrative   [place] in [place].  Retired.    Family History   Problem Relation Age of Onset  . Heart attack Father     died @ 3459  . Lung cancer Mother     died @ 7485  . Other Sister     A&[employer] in her 5970's  . Bladder Cancer Brother     alive    Review of Systems:  As stated in the HPI and otherwise negative.   BP 130/72   Pulse 68   Ht 6\' 1"  (1.854 m)  Wt 225 lb 12.8 oz (102.4 kg)   BMI 29.79 kg/m   Physical Examination: General: Well developed, well nourished, NAD  HEENT: OP clear, mucus membranes moist  SKIN: warm, dry. No rashes. Neuro: No focal deficits  Musculoskeletal: Muscle strength 5/5 all ext  Psychiatric: Mood and affect normal  Neck: No JVD, no carotid bruits, no thyromegaly, no lymphadenopathy.  Lungs:Clear bilaterally, no wheezes, rhonci, crackles Cardiovascular: Regular rate and rhythm. No murmurs, gallops or rubs. Abdomen:Soft. Bowel sounds present. Non-tender.  Extremities: No lower extremity edema. Pulses are 2 + in the bilateral DP/PT.  Exercise stress myoview 06/20/13: Stress Procedure: The patient exercised on the treadmill utilizing the Bruce Protocol for 7:00 minutes. The patient stopped due to fatigue and denied any chest pain. Technetium 84m Sestamibi was injected at peak exercise and myocardial perfusion imaging was performed after a brief delay.  Stress ECG: No significant change from baseline ECG  QPS  Raw Data Images: Normal; no motion artifact; normal heart/lung ratio.  Stress Images: Normal homogeneous uptake in all areas of the myocardium.  Rest Images: Normal homogeneous uptake in all areas of the myocardium.  Subtraction (SDS): No evidence of ischemia.  Transient Ischemic Dilatation (Normal <1.22): 0.95  Lung/Heart Ratio (Normal <0.45): 0.34  Quantitative Gated Spect Images  QGS EDV: 114 ml  QGS ESV: 52 ml  Impression  Exercise Capacity: Good exercise capacity.  BP Response: Normal blood pressure response.  Clinical Symptoms: No symptoms.  ECG Impression: No significant ST segment change  suggestive of ischemia.  Comparison with Prior Nuclear Study: No images to compare  Overall Impression: Normal stress nuclear study.  LV Ejection Fraction: 54%. LV Wall Motion: NL LV Function; NL Wall Motion   EKG:  EKG is ordered today. The ekg ordered today demonstrates NSR, rate 68 bpm. Old inferior Q waves.   Recent Labs: 08/29/2015: TSH 0.541 08/30/2015: BUN 17; Creatinine, Ser 1.02; Hemoglobin 14.9; Platelets 250; Potassium 4.3; Sodium 138     Wt Readings from Last 3 Encounters:  07/23/16 225 lb 12.8 oz (102.4 kg)  11/03/15 220 lb (99.8 kg)  09/16/15 224 lb (101.6 kg)     Other studies Reviewed: Additional studies/ records that were reviewed today include: . Review of the above records demonstrates:    Assessment and Plan:   1. CAD without angina: Stable. No recent exertional chest pains suggestive of angina. Stress test 06/20/13 without ischemia. He is on an ASA, statin. He has been off of beta blocker due to fatigue.   2. Chronic diastolic CHF: LE edema is stable. Continue Lasix 40 mg po BID  3. HTN: BP stable. No changes  4. Hyperlipidemia: On statin. Lipids followed in primary care.   Current medicines are reviewed at length with the patient today.  The patient does not have concerns regarding medicines.  The following changes have been made:  no change  Labs/ tests ordered today include:   Orders Placed This Encounter  Procedures  . EKG 12-Lead    Disposition:   FU with me in 12 months  Signed, Verne Carrow, MD 07/23/2016 9:57 AM    [facility] [address] [place], [place], [place]  [address] Phone: [phone]; Fax: [phone]

## 2016-08-03 ENCOUNTER — Encounter (HOSPITAL_COMMUNITY): Payer: Self-pay

## 2016-08-03 ENCOUNTER — Emergency Department (HOSPITAL_COMMUNITY): Payer: PPO

## 2016-08-03 ENCOUNTER — Telehealth: Payer: Self-pay | Admitting: Cardiovascular Disease

## 2016-08-03 ENCOUNTER — Observation Stay (HOSPITAL_COMMUNITY)
Admission: EM | Admit: 2016-08-03 | Discharge: 2016-08-04 | Disposition: A | Discharge status: Home / Self Care | Payer: PPO | Attending: Internal Medicine | Admitting: Internal Medicine

## 2016-08-03 ENCOUNTER — Telehealth: Payer: Self-pay

## 2016-08-03 DIAGNOSIS — I700 Atherosclerosis of aorta: Secondary | ICD-10-CM | POA: Insufficient documentation

## 2016-08-03 DIAGNOSIS — R42 Dizziness and giddiness: Secondary | ICD-10-CM | POA: Diagnosis not present

## 2016-08-03 DIAGNOSIS — I110 Hypertensive heart disease with heart failure: Secondary | ICD-10-CM | POA: Diagnosis not present

## 2016-08-03 DIAGNOSIS — M797 Fibromyalgia: Secondary | ICD-10-CM | POA: Insufficient documentation

## 2016-08-03 DIAGNOSIS — E119 Type 2 diabetes mellitus without complications: Secondary | ICD-10-CM

## 2016-08-03 DIAGNOSIS — M1990 Unspecified osteoarthritis, unspecified site: Secondary | ICD-10-CM | POA: Diagnosis not present

## 2016-08-03 DIAGNOSIS — F329 Major depressive disorder, single episode, unspecified: Secondary | ICD-10-CM | POA: Insufficient documentation

## 2016-08-03 DIAGNOSIS — Z9889 Other specified postprocedural states: Secondary | ICD-10-CM | POA: Diagnosis not present

## 2016-08-03 DIAGNOSIS — Z885 Allergy status to narcotic agent status: Secondary | ICD-10-CM | POA: Diagnosis not present

## 2016-08-03 DIAGNOSIS — Z801 Family history of malignant neoplasm of trachea, bronchus and lung: Secondary | ICD-10-CM | POA: Insufficient documentation

## 2016-08-03 DIAGNOSIS — Z8249 Family history of ischemic heart disease and other diseases of the circulatory system: Secondary | ICD-10-CM | POA: Insufficient documentation

## 2016-08-03 DIAGNOSIS — G4733 Obstructive sleep apnea (adult) (pediatric): Secondary | ICD-10-CM | POA: Diagnosis not present

## 2016-08-03 DIAGNOSIS — I2510 Atherosclerotic heart disease of native coronary artery without angina pectoris: Secondary | ICD-10-CM | POA: Insufficient documentation

## 2016-08-03 DIAGNOSIS — Z91040 Latex allergy status: Secondary | ICD-10-CM | POA: Insufficient documentation

## 2016-08-03 DIAGNOSIS — R404 Transient alteration of awareness: Secondary | ICD-10-CM | POA: Diagnosis not present

## 2016-08-03 DIAGNOSIS — R0789 Other chest pain: Secondary | ICD-10-CM | POA: Diagnosis not present

## 2016-08-03 DIAGNOSIS — I208 Other forms of angina pectoris: Secondary | ICD-10-CM | POA: Diagnosis not present

## 2016-08-03 DIAGNOSIS — I10 Essential (primary) hypertension: Secondary | ICD-10-CM | POA: Diagnosis present

## 2016-08-03 DIAGNOSIS — I214 Non-ST elevation (NSTEMI) myocardial infarction: Secondary | ICD-10-CM | POA: Diagnosis present

## 2016-08-03 DIAGNOSIS — K219 Gastro-esophageal reflux disease without esophagitis: Secondary | ICD-10-CM | POA: Insufficient documentation

## 2016-08-03 DIAGNOSIS — Z7984 Long term (current) use of oral hypoglycemic drugs: Secondary | ICD-10-CM | POA: Diagnosis not present

## 2016-08-03 DIAGNOSIS — F419 Anxiety disorder, unspecified: Secondary | ICD-10-CM | POA: Insufficient documentation

## 2016-08-03 DIAGNOSIS — Z79899 Other long term (current) drug therapy: Secondary | ICD-10-CM | POA: Insufficient documentation

## 2016-08-03 DIAGNOSIS — I252 Old myocardial infarction: Secondary | ICD-10-CM | POA: Diagnosis not present

## 2016-08-03 DIAGNOSIS — R079 Chest pain, unspecified: Secondary | ICD-10-CM

## 2016-08-03 DIAGNOSIS — I452 Bifascicular block: Secondary | ICD-10-CM | POA: Diagnosis not present

## 2016-08-03 DIAGNOSIS — Z881 Allergy status to other antibiotic agents status: Secondary | ICD-10-CM | POA: Diagnosis not present

## 2016-08-03 DIAGNOSIS — I5032 Chronic diastolic (congestive) heart failure: Secondary | ICD-10-CM | POA: Diagnosis not present

## 2016-08-03 DIAGNOSIS — E785 Hyperlipidemia, unspecified: Secondary | ICD-10-CM | POA: Diagnosis not present

## 2016-08-03 DIAGNOSIS — Z7982 Long term (current) use of aspirin: Secondary | ICD-10-CM | POA: Insufficient documentation

## 2016-08-03 DIAGNOSIS — Z87891 Personal history of nicotine dependence: Secondary | ICD-10-CM | POA: Insufficient documentation

## 2016-08-03 DIAGNOSIS — Z951 Presence of aortocoronary bypass graft: Secondary | ICD-10-CM

## 2016-08-03 DIAGNOSIS — R0602 Shortness of breath: Secondary | ICD-10-CM | POA: Diagnosis not present

## 2016-08-03 DIAGNOSIS — Z8052 Family history of malignant neoplasm of bladder: Secondary | ICD-10-CM | POA: Insufficient documentation

## 2016-08-03 DIAGNOSIS — G8929 Other chronic pain: Secondary | ICD-10-CM | POA: Diagnosis not present

## 2016-08-03 LAB — BASIC METABOLIC PANEL
Anion gap: 7 (ref 5–15)
BUN: 16 mg/dL (ref 6–20)
CALCIUM: 9.1 mg/dL (ref 8.9–10.3)
CO2: 24 mmol/L (ref 22–32)
CREATININE: 0.94 mg/dL (ref 0.61–1.24)
Chloride: 106 mmol/L (ref 101–111)
Glucose, Bld: 108 mg/dL — ABNORMAL HIGH (ref 65–99)
Potassium: 4.5 mmol/L (ref 3.5–5.1)
SODIUM: 137 mmol/L (ref 135–145)

## 2016-08-03 LAB — TROPONIN I

## 2016-08-03 LAB — CBC
HCT: 40.9 % (ref 39.0–52.0)
Hemoglobin: 13.4 g/dL (ref 13.0–17.0)
MCH: 28.9 pg (ref 26.0–34.0)
MCHC: 32.8 g/dL (ref 30.0–36.0)
MCV: 88.1 fL (ref 78.0–100.0)
PLATELETS: 222 10*3/uL (ref 150–400)
RBC: 4.64 MIL/uL (ref 4.22–5.81)
RDW: 13.8 % (ref 11.5–15.5)
WBC: 4.3 10*3/uL (ref 4.0–10.5)

## 2016-08-03 LAB — D-DIMER, QUANTITATIVE (NOT AT ARMC): D DIMER QUANT: 0.34 ug{FEU}/mL (ref 0.00–0.50)

## 2016-08-03 LAB — GLUCOSE, CAPILLARY: GLUCOSE-CAPILLARY: 157 mg/dL — AB (ref 65–99)

## 2016-08-03 LAB — I-STAT TROPONIN, ED: TROPONIN I, POC: 0 ng/mL (ref 0.00–0.08)

## 2016-08-03 MED ORDER — PANTOPRAZOLE SODIUM 40 MG PO TBEC
40.0000 mg | DELAYED_RELEASE_TABLET | Freq: Every day | ORAL | Status: DC
  Administered 2016-08-04: 40 mg via ORAL
  Filled 2016-08-03: qty 1

## 2016-08-03 MED ORDER — ACETAMINOPHEN 325 MG PO TABS
650.0000 mg | ORAL_TABLET | ORAL | Status: DC | PRN
Start: 2016-08-03 — End: 2016-08-04

## 2016-08-03 MED ORDER — ASPIRIN EC 81 MG PO TBEC
81.0000 mg | DELAYED_RELEASE_TABLET | Freq: Every day | ORAL | Status: DC
  Administered 2016-08-04: 81 mg via ORAL
  Filled 2016-08-03: qty 1

## 2016-08-03 MED ORDER — NITROGLYCERIN 0.4 MG SL SUBL: 0.4000 mg | SUBLINGUAL_TABLET | SUBLINGUAL | Status: DC | PRN

## 2016-08-03 MED ORDER — ATORVASTATIN CALCIUM 40 MG PO TABS
40.0000 mg | ORAL_TABLET | Freq: Every day | ORAL | Status: DC
  Administered 2016-08-04: 40 mg via ORAL
  Filled 2016-08-03: qty 1

## 2016-08-03 MED ORDER — VILAZODONE HCL 40 MG PO TABS: 40.0000 mg | ORAL_TABLET | Freq: Every day | ORAL | Status: DC

## 2016-08-03 MED ORDER — BUPROPION HCL ER (SR) 150 MG PO TB12
150.0000 mg | ORAL_TABLET | Freq: Every day | ORAL | Status: DC
  Administered 2016-08-04: 150 mg via ORAL
  Filled 2016-08-03: qty 1

## 2016-08-03 MED ORDER — ENOXAPARIN SODIUM 40 MG/0.4ML ~~LOC~~ SOLN
40.0000 mg | SUBCUTANEOUS | Status: DC
  Administered 2016-08-03: 40 mg via SUBCUTANEOUS
  Filled 2016-08-03: qty 0.4

## 2016-08-03 MED ORDER — ONDANSETRON HCL 4 MG/2ML IJ SOLN: 4.0000 mg | Freq: Four times a day (QID) | INTRAMUSCULAR | Status: DC | PRN

## 2016-08-03 NOTE — Plan of Care (Signed)
Problem: Safety: Goal: Ability to remain free from injury will improve Outcome: Progressing oriented to rooms and surroundings.  Call bell within reach

## 2016-08-03 NOTE — Telephone Encounter (Signed)
Patient called and states that about 30 minutes ago he was cleaning out his tackle box and he became very dizzy and started sweating profusely. He states that he took his ASA and took 1 NTG. He denied having any chest pain at the time. He states that he was not able to take his Bp. He was calling to let us know of his symptoms. While on the phone with the patient he became extremely dizzy again and began sweating profusely and stated that he was having chest pain. He stated that he was going to try and lie down. The patient was advised to hang up and call 911. The patient verbalized understanding. Trish in the ED was paged to notify her that the patient was on the way.

## 2016-08-03 NOTE — Telephone Encounter (Signed)
New Message  Pt voiced wanting the nurse to give him a call and pt would not elaborate as to why he wants someone to call.  Pt voiced he was told my MD-McAlhany if he had any questions to call and ask to speak with nurse.  Pt got back and said whatever and hung up.  Please f/u with pt

## 2016-08-03 NOTE — Telephone Encounter (Signed)
Error in opening this encounter.

## 2016-08-03 NOTE — Progress Notes (Signed)
Patient arrived from Ed with admission DX of Chest pain. Denies pain at this time. Alert and oriented. Vitals stable. CCMD called and verified. Telemetry on. Oriented to room and surroundings. Skin assessed with two nurses.

## 2016-08-03 NOTE — H&P (Signed)
History and Physical        Hospital Admission Note Date: 08/03/2016  Patient name: Timothy Cervantes Medical record number: [id number] Date of birth: Mar 08, 1941 Age: 76 y.o. Gender: male  PCP: Alysia PennaHOLWERDA, SCOTT, MD   Patient coming from: Home   Chief Complaint:  Chest pain  HPI: Patient is a 76 year old male with history of CAD s/p 5V CABG March 2014, DM, HTN, HLD, tobacco use, OSA presented to ED with chest pain. History was obtained from the patient who reported that around 12:30 PM he was cleaning his fish tank when he started having significant dizziness and unsteadiness. He called his doctor's office and while talking on the phone he started having diaphoresis, left-sided chest pain. No radiation of the chest pain. The chest pain was rated as 3 out of 10, associated with shortness of breath, nausea and diaphoresis. Per patient his symptoms were similar to when he had his MI in 2014. He took one aspirin and nitroglycerin sublingual x1. Per patient chest pain eased after about 10 minutes. He called EMS and patient was brought here. At this time chest pain has improved. No fevers, chills or any coughing.  ED work-up/course:  Temp 98.6, respiratory rate 19, pulse 61, BP 121/72, O2 sats 97% on room air Troponin 2 negative EKG showed sinus rhythm, rate 68, incomplete right bundle branch block, LAFB Chest x-ray with chronic bronchitic changes otherwise no pneumonia  Review of Systems: Positives marked in 'bold' Constitutional: Denies fever, chills, diaphoresis, poor appetite and fatigue.  HEENT: Denies photophobia, eye pain, redness, hearing loss, ear pain, congestion, sore throat, rhinorrhea, sneezing, mouth sores, trouble swallowing, neck pain, neck stiffness and tinnitus.   Respiratory: Denies cough, wheezing, fevers or chills Cardiovascular: Please see history of present  illness Gastrointestinal: + nausea, denied any vomiting, abdominal pain, diarrhea, constipation, blood in stool and abdominal distention.  Genitourinary: Denies dysuria, urgency, frequency, hematuria, flank pain and difficulty urinating.  Musculoskeletal: Denies myalgias, back pain, joint swelling, arthralgias and gait problem.  Skin: Denies pallor, rash and wound.  Neurological: Denies dizziness, seizures, syncope, weakness, light-headedness, numbness and headaches.  Hematological: Denies adenopathy. Easy bruising, personal or family bleeding history  Psychiatric/Behavioral: Denies suicidal ideation, mood changes, confusion, nervousness, sleep disturbance and agitation  Past Medical History: Past Medical History:  Diagnosis Date  . Anxiety   . Back pain, chronic   . CAD (coronary artery disease)    a. s/p NSTEMI 07/2012 => LHC with 3v CAD => s/p CABG 08/25/12 (LIMA-LAD, SVG-DX, SVG-OM1/OM2, SVG-PDA) Dr. Tyrone SageGerhardt;  b.  Echo 08/24/12:  Mild LVH, EF 65-70%, mild RVE, PASP 36  . Chronic back pain   . Chronic pain   . Complication of anesthesia    hard to wake up  . Depression   . DJD (degenerative joint disease)   . Fatty liver   . Fibromyalgia   . GERD (gastroesophageal reflux disease)   . History of tobacco abuse    a. 57 year hx, quit 03/2012.  [name]. HLD (hyperlipidemia)   . Hypertension   . NSTEMI (non-ST elevated myocardial infarction) (HCC) 07/2012  . Osteoarthritis   . Shortness of breath dyspnea    with extertion  .  Sleep apnea    uses CPAP every night  . Sleep apnea   . Type II diabetes mellitus (HCC) a. Dx in 2010    Past Surgical History:  Procedure Laterality Date  . CHOLECYSTECTOMY    . CORONARY ARTERY BYPASS GRAFT N/A 08/25/2012   Procedure: CORONARY ARTERY BYPASS GRAFTING (CABG);  Surgeon: Delight Ovens, MD;  Location: [facility] OR;  Service: Open Heart Surgery;  Laterality: N/A;  Coronary artery bypass graft times five using left internal mammary artery and bilateral  saphenous leg vein using endoscope.  . INTRAOPERATIVE TRANSESOPHAGEAL ECHOCARDIOGRAM N/A 08/25/2012   Procedure: INTRAOPERATIVE TRANSESOPHAGEAL ECHOCARDIOGRAM;  Surgeon: Delight Ovens, MD;  Location: [facility] OR;  Service: Open Heart Surgery;  Laterality: N/A;  . LEFT HEART CATHETERIZATION WITH CORONARY ANGIOGRAM N/A 08/23/2012   Procedure: LEFT HEART CATHETERIZATION WITH CORONARY ANGIOGRAM;  Surgeon: Kathleene Hazel, MD;  Location: [facility] CATH LAB;  Service: Cardiovascular;  Laterality: N/A;  . NASAL SINUS SURGERY    . PAROTIDECTOMY Left    parotid gland resection-non cancerous  . Right Ankle Surgery    . ROTATOR CUFF REPAIR Bilateral   . SHOULDER SURGERY Bilateral   . TRANSURETHRAL RESECTION OF PROSTATE    . TRIGGER FINGER RELEASE Left 06/24/2015   Procedure: RELEASE A-1 PULLEY LEFT LONG AND LEFT RING FINGERS;  Surgeon: Cindee Salt, MD;  Location: [facility];  Service: Orthopedics;  Laterality: Left;  . TRIGGER FINGER RELEASE Right 07/29/2015   Procedure: RELEASE A-1 PULLEY RIGHT INDEX FINGER, RIGHT MIDDLE FINGER, RIGHT RING FINGER, RIGHT SMALL FINGER;  Surgeon: Cindee Salt, MD;  Location: [facility];  Service: Orthopedics;  Laterality: Right;  [name] Vocal Cord Tumor resection      Medications: Prior to Admission medications   Medication Sig Start Date End Date Taking? Authorizing Provider  acetaminophen (TYLENOL) 325 MG tablet Take 2 tablets (650 mg total) by mouth every 4 (four) hours as needed for headache or mild pain. 08/30/15   Abelino Derrick, PA-C  aspirin 81 MG tablet Take 1 tablet (81 mg total) by mouth daily. 03/17/15   Kathleene Hazel, MD  atorvastatin (LIPITOR) 40 MG tablet Take 40 mg by mouth daily. 07/09/15   Historical Provider, MD  Buprenorphine (BUTRANS) 15 MCG/HR PTWK Place 1 patch onto the skin once a week.    Historical Provider, MD  buPROPion [facility] SR) 150 MG 12 hr tablet Take 1 tablet by mouth every morning. 10/21/15   Historical Provider,  MD  cholecalciferol (VITAMIN D) 1000 UNITS tablet Take 1,000 Units by mouth daily.    Historical Provider, MD  furosemide (LASIX) 40 MG tablet TAKE 1 TABLET BY MOUTH TWICE DAILY 06/18/16   Kathleene Hazel, MD  metFORMIN (GLUCOPHAGE) 500 MG tablet Take 500 mg by mouth 2 (two) times daily with a meal.     Historical Provider, MD  nitroGLYCERIN (NITROSTAT) 0.4 MG SL tablet Place 1 tablet (0.4 mg total) under the tongue every 5 (five) minutes as needed for chest pain. 06/04/13   Kathleene Hazel, MD  pantoprazole (PROTONIX) 40 MG tablet Take 1 tablet by mouth daily. 10/14/15   Historical Provider, MD  potassium chloride SA (K-DUR,KLOR-CON) 20 MEQ tablet TAKE 2 TABLETS BY MOUTH EVERY DAY 07/24/14   Kathleene Hazel, MD  potassium chloride SA (K-DUR,KLOR-CON) 20 MEQ tablet TAKE 2 TABLETS BY MOUTH EVERY DAY 11/25/15   Kathleene Hazel, MD  sennosides-docusate sodium (SENOKOT-S) 8.6-50 MG tablet Take 1 tablet by mouth 2 (two) times  daily.    Historical Provider, MD  temazepam (RESTORIL) 15 MG capsule Take 15 mg by mouth at bedtime. 07/27/15   Historical Provider, MD  Vilazodone HCl (VIIBRYD) 40 MG TABS Take 40 mg by mouth daily.    Historical Provider, MD    Allergies:   Allergies  Allergen Reactions  . Latex     Had reaction to latex foley catheter, but ok with gloves  . Morphine And Related Nausea Only  . Neosporin [Neomycin-Polymyxin-Gramicidin] Rash    Social History:  reports that he has quit smoking. His smoking use included Cigarettes. He has a 57.00 pack-year smoking history. He has quit using smokeless tobacco. His smokeless tobacco use included Chew. He reports that he drinks alcohol. He reports that he does not use drugs.  Family History: Family History  Problem Relation Age of Onset  . Lung cancer Mother     died @ 45  . Heart attack Father     died @ 88  . Other Sister     A&W in her 49's  . Bladder Cancer Brother     alive    Physical Exam: Blood  pressure 107/71, pulse 66, temperature 98.6 F (37 C), temperature source Oral, resp. rate 12, height 6\' 1"  (1.854 m), weight 102.1 kg (225 lb), SpO2 96 %. General: Alert, awake, oriented x3, in no acute distress. HEENT: normocephalic, atraumatic, anicteric sclera, pink conjunctiva, pupils equal and reactive to light and accomodation, oropharynx clear Neck: supple, no masses or lymphadenopathy, no goiter, no bruits  Heart: Regular rate and rhythm, without murmurs, rubs or gallops. Lungs: Clear to auscultation bilaterally, no wheezing, rales or rhonchi. Abdomen: Soft, nontender, nondistended, positive bowel sounds, no masses. Extremities: No clubbing, cyanosis or edema with positive pedal pulses. Neuro: Grossly intact, no focal neurological deficits, strength 5/5 upper and lower extremities bilaterally Psych: alert and oriented x 3, normal mood and affect Skin: no rashes or lesions, warm and dry   LABS on Admission:  Basic Metabolic Panel:  Recent Labs Lab 08/03/16 1332  NA 137  K 4.5  CL 106  CO2 24  GLUCOSE 108*  BUN 16  CREATININE 0.94  CALCIUM 9.1   Liver Function Tests: No results for input(s): AST, ALT, ALKPHOS, BILITOT, PROT, ALBUMIN in the last 168 hours. No results for input(s): LIPASE, AMYLASE in the last 168 hours. No results for input(s): AMMONIA in the last 168 hours. CBC:  Recent Labs Lab 08/03/16 1332  WBC 4.3  HGB 13.4  HCT 40.9  MCV 88.1  PLT 222   Cardiac Enzymes:  Recent Labs Lab 08/03/16 1340  TROPONINI <0.03   BNP: Invalid input(s): POCBNP CBG: No results for input(s): GLUCAP in the last 168 hours.  Radiological Exams on Admission:  No results found.  *I have personally reviewed the images above*  EKG: Independently reviewed. No acute ST-T wave changes suggestive of ischemia EKG showed sinus rhythm, rate 68, incomplete right bundle branch block, LAFB  Assessment/Plan Principal Problem:   Chest pain with underlying history of  NSTEMI in 2014, s/p 5V CABG 08/25/12, other risk factors include hypertension, diabetes mellitus, tobacco use, dyslipidemia - Admit to telemetry, obtain serial cardiac enzymes, d-dimer, lipid panel - Cardiology consulted, EDP to call, will likely need stress test versus cardiac cath. Last stress test in 1/15 showed no ischemia - Continue aspirin, statin   Active Problems:   Hypertension -Currently stable, continue Lasix  Chronic diastolic CHF - Currently stable, euvolemic, continue Lasix per outpatient dose  Diabetes mellitus without complication (HCC),  - Obtain hemoglobin A1c, place on sliding scale insulin while inpatient    Dyslipidemia - On statin, obtain lipid panel in a.m.  History of depression - Continue outpatient Wellbutrin, Vibryd  GERD Continue PPI  Nicotine use - Patient states that he occasionally smokes   DVT prophylaxis: Lovenox  CODE STATUS: Full code  Consults called: Cardiology by EDP  Family Communication: Admission, patients condition and plan of care including tests being ordered have been discussed with the patient and wife  who indicates understanding and agree with the plan and Code Status  Admission status: obs tele   Disposition plan: Further plan will depend as patient's clinical course evolves and further radiologic and laboratory data become available.   At the time of admission, it appears that the appropriate admission status for this patient is Observation  This is judged to be reasonable and necessary in order to provide the required intensity of service to ensure the patient's safety given the presenting symptoms, physical exam findings, and initial radiographic and laboratory data in the context of their chronic comorbidities.     Time Spent on Admission: 60 minutes   Marguetta Windish M.D. [facility] 08/03/2016, 3:40 PM Pager: [phone]  If 7PM-7AM, please contact night-coverage [email or web address] Password [email or web address]

## 2016-08-03 NOTE — ED Triage Notes (Signed)
Pt brought by EMS due to having dizziness and chest discomfort. Pt does endorse diaphoresis and chest discomfort after ingestion of nitro. Pt has had 324mg  of aspirin and nitrox1. Pt a&ox4.

## 2016-08-03 NOTE — ED Notes (Signed)
Cardiologist at bedside.  

## 2016-08-03 NOTE — ED Notes (Signed)
ED Provider at bedside. 

## 2016-08-03 NOTE — Consult Note (Signed)
Cardiology Consult    Patient ID: Timothy Cervantes MRN: [id number], DOB/AGE: 1941/01/31   Admit date: 08/03/2016 Date of Consult: 08/03/2016  Primary Physician: Timothy Penna, MD Primary Cardiologist: Timothy Cervantes Requesting Provider: Isidoro Cervantes Reason for Consultation: Chest pain  Patient Profile    76 yo male with PMH of 5v CABG, HTN, HL, OSA and DM who presented with chest pain and diaphoresis.   Past Medical History   Past Medical History:  Diagnosis Date  . Anxiety   . Back pain, chronic   . CAD (coronary artery disease)    a. s/p NSTEMI 07/2012 => LHC with 3v CAD => s/p CABG 08/25/12 (LIMA-LAD, SVG-DX, SVG-OM1/OM2, SVG-PDA) Dr. Tyrone Cervantes;  b.  Echo 08/24/12:  Mild LVH, EF 65-70%, mild RVE, PASP 36  . Chronic back pain   . Chronic pain   . Complication of anesthesia    hard to wake up  . Depression   . DJD (degenerative joint disease)   . Fatty liver   . Fibromyalgia   . GERD (gastroesophageal reflux disease)   . History of tobacco abuse    a. 57 year hx, quit 03/2012.  [name] HLD (hyperlipidemia)   . Hypertension   . NSTEMI (non-ST elevated myocardial infarction) (HCC) 07/2012  . Osteoarthritis   . Shortness of breath dyspnea    with extertion  . Sleep apnea    uses CPAP every night  . Sleep apnea   . Type II diabetes mellitus (HCC) a. Dx in 2010    Past Surgical History:  Procedure Laterality Date  . CHOLECYSTECTOMY    . CORONARY ARTERY BYPASS GRAFT N/A 08/25/2012   Procedure: CORONARY ARTERY BYPASS GRAFTING (CABG);  Surgeon: Timothy Ovens, MD;  Location: [facility] OR;  Service: Open Heart Surgery;  Laterality: N/A;  Coronary artery bypass graft times five using left internal mammary artery and bilateral saphenous leg vein using endoscope.  . INTRAOPERATIVE TRANSESOPHAGEAL ECHOCARDIOGRAM N/A 08/25/2012   Procedure: INTRAOPERATIVE TRANSESOPHAGEAL ECHOCARDIOGRAM;  Surgeon: Timothy Ovens, MD;  Location: [facility] OR;  Service: Open Heart Surgery;  Laterality: N/A;  . LEFT HEART  CATHETERIZATION WITH CORONARY ANGIOGRAM N/A 08/23/2012   Procedure: LEFT HEART CATHETERIZATION WITH CORONARY ANGIOGRAM;  Surgeon: Timothy Hazel, MD;  Location: [facility] C[facility] LAB;  Service: Cardiovascular;  Laterality: N/A;  . NASAL SINUS SURGERY    . PAROTIDECTOMY Left    parotid gland resection-non cancerous  . Right Ankle Surgery    . ROTATOR CUFF REPAIR Bilateral   . SHOULDER SURGERY Bilateral   . TRANSURETHRAL RESECTION OF PROSTATE    . TRIGGER FINGER RELEASE Left 06/24/2015   Procedure: RELEASE A-1 PULLEY LEFT LONG AND LEFT RING FINGERS;  Surgeon: Timothy Salt, MD;  Location: [facility];  Service: Orthopedics;  Laterality: Left;  . TRIGGER FINGER RELEASE Right 07/29/2015   Procedure: RELEASE A-1 PULLEY RIGHT INDEX FINGER, RIGHT MIDDLE FINGER, RIGHT RING FINGER, RIGHT SMALL FINGER;  Surgeon: Timothy Salt, MD;  Location: [facility];  Service: Orthopedics;  Laterality: Right;  [name] Vocal Cord Tumor resection       Allergies  Allergies  Allergen Reactions  . Latex Other (See Comments)    Had reaction to latex foley catheter, but ok with gloves  . Morphine And Related Nausea Only  . Neosporin [Neomycin-Polymyxin-Gramicidin] Rash    History of Present Illness    [name] is a 76 yo male with PMH of 5v CABG, HTN, HL, OSA and DM. Underwent 5v CABG on 5/14 after NSTEMI.  Echo 08/24/12 with normal LV function, no valve disease. Pre-op dopplers with no carotid stenosis. 5V CABG 08/25/12 (LIMA-LAD, SVG-DX, SVG-OM1/OM2, SVG-PDA). Metoprolol was stopped October 2014 due to fatigue. He was seen in 1/15 and reported right sided chest and jaw pain after working on a deck. Stress myoview showed no ischemia. He was last seen in the office on 07/23/16 for routine follow up and reported doing well. Noted to be off his BB due to fatigue.   Reports he was in his usual state of health until this morning. He was at his home arranging his tackle box when he developed profuse  diaphoresis. Tried to walk down his hallway and felt dizzy, had to hold the wall to walk. No palpitations. Called the office to get an appt, but was instructed to come to the ED. Developed upper left sided chest aching while on the phone. Took 1 SL nitro, without relief but thinks it may have been expired. Called EMS to come to the ED. States he was told his HR looked slightly irregular by firefighter.  In the ED his labs showed stable electrolytes, trop neg x1, Hgb 13.4. CXR was negative. EKG showed SR without acute ST/T wave changes. D dimer neg.   Inpatient Medications      Family History    Family History  Problem Relation Age of Onset  . Lung cancer Mother     died @ 32  . Heart attack Father     died @ 25  . Other Sister     A&W in her 5's  . Bladder Cancer Brother     alive    Social History    Social History   Social History  . Marital status: Married    Spouse name: N/A  . Number of children: N/A  . Years of education: N/A   Occupational History  . Not on file.   Social History Main Topics  . Smoking status: Former Smoker    Packs/day: 1.00    Years: 57.00    Types: Cigarettes  . Smokeless tobacco: Former Neurosurgeon    Types: Chew     Comment: smoked from the age of 59.  Quit 03/2012  . Alcohol use Yes     Comment: social  . Drug use: No  . Sexual activity: Not on file   Other Topics Concern  . Not on file   Social History Narrative   Lives in [place] with wife.  Retired.     Review of Systems    General:  No chills, fever, night sweats or weight changes.  Cardiovascular:  See HPI Dermatological: No rash, lesions/masses Respiratory: No cough, dyspnea Urologic: No hematuria, dysuria Abdominal:   No nausea, vomiting, diarrhea, bright red blood per rectum, melena, or hematemesis Neurologic:  No visual changes,++ wkns, ++ dizziness, changes in mental status. All other systems reviewed and are otherwise negative except as noted above.  Physical Exam      Blood pressure 132/67, pulse 61, temperature 98.6 F (37 C), temperature source Oral, resp. rate 12, height 6\' 1"  (1.854 m), weight 225 lb (102.1 kg), SpO2 94 %.  General: Pleasant older WM, NAD Psych: Normal affect. Neuro: Alert and oriented X 3. Moves all extremities spontaneously. HEENT: Normal  Neck: Supple without bruits or JVD. Lungs:  Resp regular and unlabored, CTA. Heart: RRR no s3, s4, or murmurs. Abdomen: Soft, non-tender, non-distended, BS + x 4.  Extremities: No clubbing, cyanosis or edema. DP/PT/Radials 2+ and equal bilaterally.  Labs  Troponin (Point of Care Test)  Recent Labs  08/03/16 1356  TROPIPOC 0.00    Recent Labs  08/03/16 1340  TROPONINI <0.03   Lab Results  Component Value Date   WBC 4.3 08/03/2016   HGB 13.4 08/03/2016   HCT 40.9 08/03/2016   MCV 88.1 08/03/2016   PLT 222 08/03/2016    Recent Labs Lab 08/03/16 1332  NA 137  K 4.5  CL 106  CO2 24  BUN 16  CREATININE 0.94  CALCIUM 9.1  GLUCOSE 108*   Lab Results  Component Value Date   CHOL 103 08/30/2015   HDL 33 (L) 08/30/2015   LDLCALC 40 08/30/2015   TRIG 148 08/30/2015   Lab Results  Component Value Date   DDIMER 0.34 08/03/2016     Radiology Studies    Dg Chest 2 View  Result Date: 08/03/2016 CLINICAL DATA:  Mid chest pain, shortness of breath, dizziness, and diaphoresis this morning. Patient reports similar symptoms with the previous MI. History of NSTEMI, diabetes, hypertension, former smoker. Previous CABG. EXAM: CHEST  2 VIEW COMPARISON:  Chest x-ray of August 29, 2015 FINDINGS: The lungs are well-expanded. There is no focal infiltrate. There is no pleural effusion, pneumothorax, or pneumomediastinum. The heart and pulmonary vascularity are normal. The mediastinum is normal in width. There are post CABG changes. There is calcification in the wall of the aortic arch. The bony thorax exhibits no acute abnormality. IMPRESSION: Chronic bronchitic changes and changes  related to previous CABG. No pneumonia, CHF, nor other acute cardiopulmonary abnormality. Thoracic aortic atherosclerosis. Electronically Signed   By: David  [place] M.D.   On: 08/03/2016 14:17    ECG & Cardiac Imaging    EKG: SR without ST/T wave changes, incomplete RBBB  Echo: 10/14  Study Conclusions  - Left ventricle: The cavity size was normal. Wall thickness was increased in a pattern of mild LVH. Systolic function was normal. The estimated ejection fraction was in the range of 55% to 60%. Wall motion was normal; there were no regional wall motion abnormalities. - Left atrium: The atrium was mildly dilated. - Atrial septum: No defect or patent foramen ovale was identified.  Assessment & Plan    76 yo male with PMH of 5v CABG, HTN, HL, OSA and DM who presented with chest pain and diaphoresis.  1. Chest pain/diaphoresis: Developed a sudden onset this morning while at home. Reports symptoms similar to those experienced with previous NSTEMI/CABG, except he did not have significant chest pain this time. Trop neg x1, EKG non acute -- admit for overnight obs, cycle enzymes. If rules out plan for stress, if elevation or recurrent pain plan for cath  2. HTN: controlled, continue home regimen  3. HL: on statin. Check lipids  4. DM: SSI, last Hgb A1c 6.4 (3/17)  Signed, Laverda Page, NP-C Pager [phone] 08/03/2016, 4:49 PM   Attending Note:   The patient was seen and examined.  Agree with assessment and plan as noted above.  Changes made to the above note as needed.  Patient seen and independently examined with Laverda Page, NP on 08/03/16.   We discussed all aspects of the encounter. I agree with the assessment and plan as stated above.  1.  Chest pain:  Symptoms are similar to his presenting symptoms several years ago.   troponins are negative so far.   Plan is for stress myoview today . Will consider cath if troponins are positive   I have spent a total  of 40  minutes with patient reviewing hospital  notes , telemetry, EKGs, labs and examining patient as well as establishing an assessment and plan that was discussed with the patient. > 50% of time was spent in direct patient care.    Vesta MixerPhilip J. Nahser, Montez HagemanJr., MD, [facility]tr[facility] 08/04/2016, 7:09 AM [address],  [address] Office [phone] Pager [phone]

## 2016-08-03 NOTE — ED Notes (Addendum)
Cards NP Lillia AbedLindsay at bedside.

## 2016-08-03 NOTE — ED Provider Notes (Signed)
[facility]-EMERGENCY DEPT Provider Note   CSN: [id number] Arrival date & time: 08/03/16  1242     History   Chief Complaint Chief Complaint  Patient presents with  . Dizziness  . Chest Pain    HPI Timothy Cervantes is a 76 y.o. male.  HPI 76 year old male who presents with dizziness, diaphoresis, and chest discomfort. He has a history of coronary artery disease status post CABG, hypertension, hyperlipidemia, and diabetes. States that he has been in his usual state of health. Today developed sudden onset of diaphoresis, similar to when he has had his cardiac event. States that initially it was very dizzy and felt like his gait was unsteady. Subsequently developed nausea. After that noticed mild discomfort over his anterior chest with some shortness of breath. No syncope or near syncope. No leg swelling or edema. No confusion or fall. Did not feel as if room was spinning. Total length of symptoms lasted for about 1 hour. He did call EMS and he received 324 mg of aspirin and 1 nitroglycerin, and symptoms better. States that he feels asymptomatic and at his baseline since arriving to the ED.   Past Medical History:  Diagnosis Date  . Anxiety   . Back pain, chronic   . CAD (coronary artery disease)    a. s/p NSTEMI 07/2012 => LHC with 3v CAD => s/p CABG 08/25/12 (LIMA-LAD, SVG-DX, SVG-OM1/OM2, SVG-PDA) Dr. Tyrone Sage;  b.  Echo 08/24/12:  Mild LVH, EF 65-70%, mild RVE, PASP 36  . Chronic back pain   . Chronic pain   . Complication of anesthesia    hard to wake up  . Depression   . DJD (degenerative joint disease)   . Fatty liver   . Fibromyalgia   . GERD (gastroesophageal reflux disease)   . History of tobacco abuse    a. 57 year hx, quit 03/2012.  [name] HLD (hyperlipidemia)   . Hypertension   . NSTEMI (non-ST elevated myocardial infarction) (HCC) 07/2012  . Osteoarthritis   . Shortness of breath dyspnea    with extertion  . Sleep apnea    uses CPAP every night  . Sleep apnea   . Type II  diabetes mellitus (HCC) a. Dx in 2010    Patient Active Problem List   Diagnosis Date Noted  . Chest pain 08/03/2016  . Dyslipidemia 09/02/2015  . Spondylolisthesis of lumbar region 05/27/2014  . S/P CABG x 5-  2014 09/19/2012  . Cardiomyopathy, ischemic 08/30/2012  . NSTEMI (non-ST elevated myocardial infarction) (HCC) 08/24/2012  . Unstable angina (HCC) 08/23/2012  . Fibromyalgia   . Chronic pain   . Depression   . DJD (degenerative joint disease)   . Back pain, chronic   . Hypertension   . Diabetes mellitus without complication (HCC)   . Chronic back pain     Past Surgical History:  Procedure Laterality Date  . CHOLECYSTECTOMY    . CORONARY ARTERY BYPASS GRAFT N/A 08/25/2012   Procedure: CORONARY ARTERY BYPASS GRAFTING (CABG);  Surgeon: Delight Ovens, MD;  Location: [facility] OR;  Service: Open Heart Surgery;  Laterality: N/A;  Coronary artery bypass graft times five using left internal mammary artery and bilateral saphenous leg vein using endoscope.  . INTRAOPERATIVE TRANSESOPHAGEAL ECHOCARDIOGRAM N/A 08/25/2012   Procedure: INTRAOPERATIVE TRANSESOPHAGEAL ECHOCARDIOGRAM;  Surgeon: Delight Ovens, MD;  Location: [facility] OR;  Service: Open Heart Surgery;  Laterality: N/A;  . LEFT HEART CATHETERIZATION WITH CORONARY ANGIOGRAM N/A 08/23/2012   Procedure: LEFT HEART CATHETERIZATION WITH CORONARY ANGIOGRAM;  Surgeon: Kathleene Hazel, MD;  Location: [facility] CATH LAB;  Service: Cardiovascular;  Laterality: N/A;  . NASAL SINUS SURGERY    . PAROTIDECTOMY Left    parotid gland resection-non cancerous  . Right Ankle Surgery    . ROTATOR CUFF REPAIR Bilateral   . SHOULDER SURGERY Bilateral   . TRANSURETHRAL RESECTION OF PROSTATE    . TRIGGER FINGER RELEASE Left 06/24/2015   Procedure: RELEASE A-1 PULLEY LEFT LONG AND LEFT RING FINGERS;  Surgeon: Cindee Salt, MD;  Location: [facility];  Service: Orthopedics;  Laterality: Left;  . TRIGGER FINGER RELEASE Right 07/29/2015    Procedure: RELEASE A-1 PULLEY RIGHT INDEX FINGER, RIGHT MIDDLE FINGER, RIGHT RING FINGER, RIGHT SMALL FINGER;  Surgeon: Cindee Salt, MD;  Location:  SURGERY CENTER;  Service: Orthopedics;  Laterality: Right;  [name] Vocal Cord Tumor resection         Home Medications    Prior to Admission medications   Medication Sig Start Date End Date Taking? Authorizing Provider  acetaminophen (TYLENOL) 325 MG tablet Take 2 tablets (650 mg total) by mouth every 4 (four) hours as needed for headache or mild pain. 08/30/15  Yes Abelino Derrick, PA-C  aspirin 81 MG tablet Take 1 tablet (81 mg total) by mouth daily. 03/17/15  Yes Kathleene Hazel, MD  atorvastatin (LIPITOR) 40 MG tablet Take 40 mg by mouth daily. 07/09/15  Yes Historical Provider, MD  Buprenorphine (BUTRANS) 15 MCG/HR PTWK Place 1 patch onto the skin once a week.   Yes Historical Provider, MD  buPROPion (WELLBUTRIN SR) 150 MG 12 hr tablet Take 1 tablet by mouth every morning. 10/21/15  Yes Historical Provider, MD  cholecalciferol (VITAMIN D) 1000 UNITS tablet Take 1,000 Units by mouth daily.   Yes Historical Provider, MD  FLUoxetine (PROZAC) 40 MG capsule Take 40 mg by mouth daily.   Yes Historical Provider, MD  furosemide (LASIX) 40 MG tablet TAKE 1 TABLET BY MOUTH TWICE DAILY Patient taking differently: TAKE 1 TABLET BY MOUTH ONCE DAILY 06/18/16  Yes Kathleene Hazel, MD  metFORMIN (GLUCOPHAGE) 500 MG tablet Take 500 mg by mouth 2 (two) times daily with a meal.    Yes Historical Provider, MD  nitroGLYCERIN (NITROSTAT) 0.4 MG SL tablet Place 1 tablet (0.4 mg total) under the tongue every 5 (five) minutes as needed for chest pain. 06/04/13  Yes Kathleene Hazel, MD  pantoprazole (PROTONIX) 40 MG tablet Take 1 tablet by mouth daily. 10/14/15  Yes Historical Provider, MD  potassium chloride SA (K-DUR,KLOR-CON) 20 MEQ tablet TAKE 2 TABLETS BY MOUTH EVERY DAY 07/24/14  Yes Kathleene Hazel, MD  sennosides-docusate sodium  (SENOKOT-S) 8.6-50 MG tablet Take 1 tablet by mouth 2 (two) times daily.   Yes Historical Provider, MD  temazepam (RESTORIL) 15 MG capsule Take 15 mg by mouth at bedtime. 07/27/15  Yes Historical Provider, MD  potassium chloride SA (K-DUR,KLOR-CON) 20 MEQ tablet TAKE 2 TABLETS BY MOUTH EVERY DAY Patient not taking: Reported on 08/03/2016 11/25/15   Kathleene Hazel, MD    Family History Family History  Problem Relation Age of Onset  . Lung cancer Mother     died @ 63  . Heart attack Father     died @ 57  . Other Sister     A&W in her 70's  . Bladder Cancer Brother     alive    Social History Social History  Substance Use Topics  . Smoking status: Former Smoker  Packs/day: 1.00    Years: 57.00    Types: Cigarettes  . Smokeless tobacco: Former NeurosurgeonUser    Types: Chew     Comment: smoked from the age of 76.  Quit 03/2012  . Alcohol use Yes     Comment: social     Allergies   Latex; Morphine and related; and Neosporin [neomycin-polymyxin-gramicidin]   Review of Systems Review of Systems 10/14 systems reviewed and are negative other than those stated in the HPI   Physical Exam Updated Vital Signs BP 132/67   Pulse 61   Temp 98.6 F (37 C) (Oral)   Resp 12   Ht 6\' 1"  (1.854 m)   Wt 225 lb (102.1 kg)   SpO2 94%   BMI 29.69 kg/m   Physical Exam Physical Exam  Nursing note and vitals reviewed. Constitutional:  non-toxic, and in no acute distress Head: Normocephalic and atraumatic.  Mouth/Throat: Oropharynx is clear and moist.  Neck: Normal range of motion. Neck supple.  Cardiovascular: Normal rate and regular rhythm.  no edema Pulmonary/Chest: Effort normal and breath sounds normal.  Abdominal: Soft. There is no tenderness. There is no rebound and no guarding.  Musculoskeletal: Normal range of motion.  Neurological: Alert, no facial droop, fluent speech, moves all extremities symmetrically, PERRL, EOMI without nystagmus, sensation to light touch in tact  throughout, steady non-ataxic gait, no dysmetria with finger to nose Skin: Skin is warm and dry.  Psychiatric: Cooperative   ED Treatments / Results  Labs (all labs ordered are listed, but only abnormal results are displayed) Labs Reviewed  BASIC METABOLIC PANEL - Abnormal; Notable for the following:       Result Value   Glucose, Bld 108 (*)    All other components within normal limits  CBC  TROPONIN I  D-DIMER, QUANTITATIVE (NOT AT [facility])  I-STAT TROPOININ, ED    EKG  EKG Interpretation  Date/Time:  Tuesday August 03 2016 12:45:40 EST Ventricular Rate:  68 PR Interval:    QRS Duration: 100 QT Interval:  407 QTC Calculation: 433 R Axis:   -66 Text Interpretation:  Sinus rhythm Incomplete RBBB and LAFB similar to prior eKG  Confirmed by Demetrick Eichenberger MD, Alexey Rhoads [phone]([id number]) on 08/03/2016 1:53:32 PM       Radiology Dg Chest 2 View  Result Date: 08/03/2016 CLINICAL DATA:  Mid chest pain, shortness of breath, dizziness, and diaphoresis this morning. Patient reports similar symptoms with the previous MI. History of NSTEMI, diabetes, hypertension, former smoker. Previous CABG. EXAM: CHEST  2 VIEW COMPARISON:  Chest x-ray of August 29, 2015 FINDINGS: The lungs are well-expanded. There is no focal infiltrate. There is no pleural effusion, pneumothorax, or pneumomediastinum. The heart and pulmonary vascularity are normal. The mediastinum is normal in width. There are post CABG changes. There is calcification in the wall of the aortic arch. The bony thorax exhibits no acute abnormality. IMPRESSION: Chronic bronchitic changes and changes related to previous CABG. No pneumonia, CHF, nor other acute cardiopulmonary abnormality. Thoracic aortic atherosclerosis. Electronically Signed   By: David  Swazil[place]Jordan M.D.   On: 08/03/2016 14:17    Procedures Procedures (including critical care time)  Medications Ordered in ED Medications - No data to display   Initial Impression / Assessment and Plan / ED Course  I have  reviewed the triage vital signs and the nursing notes.  Pertinent labs & imaging results that were available during my care of the patient were reviewed by me and considered in my medical decision making (see  chart for details).     Presenting with episode of chest discomfort, dizziness, diaphoresis, now resolved. Vitals are stable and he is well-appearing. No focal neurological deficits and remainder of exam is nonfocal and unremarkable.  Higher risk ACS, likely requiring admission for cardiac rule out. EKG is nonischemic and troponin 1 is negative. Chest x-ray visualized and does not show acute cardiopulmonary processes. Low suspicion for PE or dissection at this time.   Question if he was having vertiginous symptoms before, but no neuro abnormalities on exam to be concerning for central cause of vertigo.  Discussed with medicine service who will admit for cardiac rule out. Cardiology consulted per their request.   Final Clinical Impressions(s) / ED Diagnoses   Final diagnoses:  Dizziness  Chest pain, unspecified type    New Prescriptions New Prescriptions   No medications on file     Lavera Guise, MD 08/03/16 1640

## 2016-08-04 ENCOUNTER — Observation Stay (HOSPITAL_BASED_OUTPATIENT_CLINIC_OR_DEPARTMENT_OTHER): Payer: PPO

## 2016-08-04 DIAGNOSIS — I208 Other forms of angina pectoris: Secondary | ICD-10-CM | POA: Diagnosis not present

## 2016-08-04 DIAGNOSIS — R079 Chest pain, unspecified: Secondary | ICD-10-CM

## 2016-08-04 DIAGNOSIS — E119 Type 2 diabetes mellitus without complications: Secondary | ICD-10-CM | POA: Diagnosis not present

## 2016-08-04 DIAGNOSIS — I10 Essential (primary) hypertension: Secondary | ICD-10-CM

## 2016-08-04 LAB — NM MYOCAR MULTI W/SPECT W/WALL MOTION / EF
CHL CUP MPHR: 147 {beats}/min
CSEPEDS: 16 s
Estimated workload: 1 METS
Exercise duration (min): 5 min
LHR: 0.35
LV sys vol: 52 mL
LVDIAVOL: 115 mL (ref 62–150)
NUC STRESS TID: 1.34
Peak HR: 101 {beats}/min
Percent HR: 68 %
Rest HR: 59 {beats}/min
SDS: 0
SRS: 1
SSS: 1

## 2016-08-04 LAB — GLUCOSE, CAPILLARY
GLUCOSE-CAPILLARY: 102 mg/dL — AB (ref 65–99)
GLUCOSE-CAPILLARY: 150 mg/dL — AB (ref 65–99)

## 2016-08-04 LAB — TROPONIN I

## 2016-08-04 LAB — LIPID PANEL
CHOL/HDL RATIO: 3 ratio
Cholesterol: 94 mg/dL (ref 0–200)
HDL: 31 mg/dL — AB (ref >40)
LDL CALC: 48 mg/dL (ref 0–99)
Triglycerides: 75 mg/dL (ref <150)
VLDL: 15 mg/dL (ref 0–40)

## 2016-08-04 MED ORDER — TECHNETIUM TC 99M TETROFOSMIN IV KIT
10.0000 | PACK | Freq: Once | INTRAVENOUS | Status: AC | PRN
  Administered 2016-08-04: 10 via INTRAVENOUS

## 2016-08-04 MED ORDER — REGADENOSON 0.4 MG/5ML IV SOLN
INTRAVENOUS | Status: AC
Start: 2016-08-04 — End: 2016-08-04
  Administered 2016-08-04: 0.4 mg via INTRAVENOUS
  Filled 2016-08-04: qty 5

## 2016-08-04 MED ORDER — REGADENOSON 0.4 MG/5ML IV SOLN
0.4000 mg | Freq: Once | INTRAVENOUS | Status: AC
  Administered 2016-08-04: 0.4 mg via INTRAVENOUS
  Filled 2016-08-04: qty 5

## 2016-08-04 MED ORDER — TECHNETIUM TC 99M TETROFOSMIN IV KIT
30.0000 | PACK | Freq: Once | INTRAVENOUS | Status: AC | PRN
  Administered 2016-08-04: 30 via INTRAVENOUS

## 2016-08-04 NOTE — Care Management Obs Status (Signed)
MEDICARE OBSERVATION STATUS NOTIFICATION   Patient Details  Name: Timothy Cervantes MRN: [id number] Date of Birth: Sep 23, 1940   Medicare Observation Status Notification Given:  Yes    Lawerance Sabalebbie Virdie Penning, RN 08/04/2016, 4:51 PM

## 2016-08-04 NOTE — Progress Notes (Signed)
Discharge instructions given. Pt verbalized understanding and all questions were answered.  

## 2016-08-04 NOTE — Telephone Encounter (Signed)
Pt admitted. cdm 

## 2016-08-04 NOTE — Progress Notes (Signed)
Progress Note  Patient Name: Timothy Cervantes Date of Encounter: 08/04/2016  Primary Cardiologist: Dr. Clifton James  Subjective   Seen in nuc med. No chest pain or dyspnea.   Inpatient Medications    Scheduled Meds: . aspirin EC  81 mg Oral Daily  . atorvastatin  40 mg Oral Daily  . buPROPion  150 mg Oral Daily  . enoxaparin (LOVENOX) injection  40 mg Subcutaneous Q24H  . pantoprazole  40 mg Oral Daily   Continuous Infusions:  PRN Meds: acetaminophen, nitroGLYCERIN, ondansetron (ZOFRAN) IV   Vital Signs    Vitals:   08/04/16 1115 08/04/16 1133 08/04/16 1135 08/04/16 1137  BP: 107/68 118/63 131/64 118/66  Pulse: 65 100 91 78  Resp:      Temp:      TempSrc:      SpO2:      Weight:      Height:        Intake/Output Summary (Last 24 hours) at 08/04/16 1137 Last data filed at 08/04/16 0800  Gross per 24 hour  Intake                0 ml  Output              660 ml  Net             -660 ml   Filed Weights   08/03/16 1257 08/03/16 2008  Weight: 225 lb (102.1 kg) 218 lb 8 oz (99.1 kg)    Telemetry    Unable to Personally Review as patient seen in nuc med  ECG    EKG with NSR - Personally Reviewed  Physical Exam   GEN: No acute distress.   Neck: No JVD Cardiac: RRR, no murmurs, rubs, or gallops.  Respiratory: Clear to auscultation bilaterally. GI: Soft, nontender, non-distended  MS: No edema; No deformity. Neuro:  Nonfocal  Psych: Normal affect   Labs    Chemistry Recent Labs Lab 08/03/16 1332  NA 137  K 4.5  CL 106  CO2 24  GLUCOSE 108*  BUN 16  CREATININE 0.94  CALCIUM 9.1  GFRNONAA >60  GFRAA >60  ANIONGAP 7     Hematology Recent Labs Lab 08/03/16 1332  WBC 4.3  RBC 4.64  HGB 13.4  HCT 40.9  MCV 88.1  MCH 28.9  MCHC 32.8  RDW 13.8  PLT 222    Cardiac Enzymes Recent Labs Lab 08/03/16 1340 08/03/16 2052 08/04/16 0153 08/04/16 0811  TROPONINI <0.03 <0.03 <0.03 <0.03    Recent Labs Lab 08/03/16 1356  TROPIPOC 0.00       BNPNo results for input(s): BNP, PROBNP in the last 168 hours.   DDimer  Recent Labs Lab 08/03/16 1332  DDIMER 0.34     Radiology    Dg Chest 2 View  Result Date: 08/03/2016 CLINICAL DATA:  Mid chest pain, shortness of breath, dizziness, and diaphoresis this morning. Patient reports similar symptoms with the previous MI. History of NSTEMI, diabetes, hypertension, former smoker. Previous CABG. EXAM: CHEST  2 VIEW COMPARISON:  Chest x-ray of August 29, 2015 FINDINGS: The lungs are well-expanded. There is no focal infiltrate. There is no pleural effusion, pneumothorax, or pneumomediastinum. The heart and pulmonary vascularity are normal. The mediastinum is normal in width. There are post CABG changes. There is calcification in the wall of the aortic arch. The bony thorax exhibits no acute abnormality. IMPRESSION: Chronic bronchitic changes and changes related to previous CABG. No pneumonia, CHF, nor other  acute cardiopulmonary abnormality. Thoracic aortic atherosclerosis. Electronically Signed   By: David  [place]azil[place]Jordan M.D.   On: 08/03/2016 14:17    Cardiac Studies   Pending nuc  Patient Profile     Timothy Cervantes is a 76 yo male with PMH of 5v CABG, HTN, HL, OSA and DM presents for chest pain.   Assessment & Plan    1. Chest pain - Troponin negative. Chest pain free since admission. Pending final nuc result. LDL 75.  Dr. Elease HashimotoNahser to see.   Signed, Manson PasseyBhagat,Bhavinkumar, PA   Attending Note:   The patient was seen and examined.  Agree with assessment and plan as noted above.  Changes made to the above note as needed.  Patient seen and independently examined with Chelsea AusVin Bhagat, PA .   We discussed all aspects of the encounter. I agree with the assessment and plan as stated above.  1.  Chest pain .:   Symptoms are fairly atypical.  troponins are negative so far. Anticipate DC if the myoview is negative    I have spent a total of 40 minutes with patient reviewing hospital  notes ,  telemetry, EKGs, labs and examining patient as well as establishing an assessment and plan that was discussed with the patient. > 50% of time was spent in direct patient care.     Vesta MixerPhilip J. Roopa Graver, Montez HagemanJr., MD, [facility] 08/04/2016, 2:25 PM [address],  [address] Office [phone] Pager [phone]    08/04/2016, 11:37 AM

## 2016-08-04 NOTE — Progress Notes (Signed)
Tech took Pt fresh bath supplies. Pt stated to tech that he had already completed bath and the wash cloth he used was on sink.

## 2016-08-04 NOTE — Progress Notes (Signed)
Patient presented for Lexiscan. Tolerated procedure well. Pending final stress imaging result.  

## 2016-08-04 NOTE — Discharge Summary (Signed)
PATIENT DETAILS Name: Timothy Cervantes Age: 76 y.o. Sex: male Date of Birth: 06-Oct-1940 MRN: [id number]. Admitting Physician: Cathren Harsh, MD [id number], Lorin Picket, MD  Admit Date: 08/03/2016 Discharge date: 08/04/2016  Recommendations for Outpatient Follow-up:  1. Follow up with PCP in 1-2 weeks 2. Please obtain BMP/CBC in one week  Admitted From:  Home  Disposition: Home   Home Health: No  Equipment/Devices: None  Discharge Condition: Stable  CODE STATUS: FULL CODE  Diet recommendation:  Heart Healthy / Carb Modified   Brief Summary: See H&P, Labs, Consult and Test reports for all details in brief, patient is a 76 year old male with history of CAD status post CABG, diabetes, hypertension who presented with sudden onset of chest pain, shortness of breath and diaphoresis.  Brief Hospital Course: Atypical chest pain: No further chest pain, troponins negative. D-dimer is negative. Underwent nuclear stress test that was negative. No further recommendations from cardiology, okay for discharge. Resume usual medications.  Hypertension: Controlled, resume Lasix.  History of MVH:QIONGEXB aspirin, statin  History of type 2 diabetes: Resume oral hypoglycemic agents on discharge.  Procedures/Studies: Nuclear Stress Test:  There was no ST segment deviation noted during stress.  The study is normal.  This is a low risk study.  The left ventricular ejection fraction is normal (55-65%).  Normal pharmacologic nuclear stress test with no evidence of prior infarct or ischemia.   Discharge Diagnoses:  Principal Problem:   Chest pain Active Problems:   Hypertension   Diabetes mellitus without complication (HCC)   NSTEMI (non-ST elevated myocardial infarction) (HCC)   S/P CABG x 5-  2014   Dyslipidemia   Discharge Instructions:  Activity:  As tolerated with Full fall precautions use walker/cane & assistance as needed   Discharge Instructions    Diet - low  sodium heart healthy    Complete by:  As directed    Diet Carb Modified    Complete by:  As directed    Increase activity slowly    Complete by:  As directed      Allergies as of 08/04/2016      Reactions   Latex Other (See Comments)   Had reaction to latex foley catheter, but ok with gloves   Morphine And Related Nausea Only   Neosporin [neomycin-polymyxin-gramicidin] Rash      Medication List    TAKE these medications   acetaminophen 325 MG tablet Commonly known as:  TYLENOL Take 2 tablets (650 mg total) by mouth every 4 (four) hours as needed for headache or mild pain.   aspirin EC 81 MG tablet Take 1 tablet (81 mg total) by mouth daily.   atorvastatin 40 MG tablet Commonly known as:  LIPITOR Take 40 mg by mouth daily.   buPROPion 150 MG 12 hr tablet Commonly known as:  WELLBUTRIN SR Take 1 tablet by mouth every morning.   BUTRANS 15 MCG/HR Ptwk Generic drug:  Buprenorphine Place 1 patch onto the skin once a week.   cholecalciferol 1000 units tablet Commonly known as:  VITAMIN D Take 1,000 Units by mouth daily.   FLUoxetine 40 MG capsule Commonly known as:  PROZAC Take 40 mg by mouth daily.   furosemide 40 MG tablet Commonly known as:  LASIX TAKE 1 TABLET BY MOUTH TWICE DAILY What changed:  See the new instructions.   metFORMIN 500 MG tablet Commonly known as:  GLUCOPHAGE Take 500 mg by mouth 2 (two) times daily with a meal.   nitroGLYCERIN 0.4 MG SL  tablet Commonly known as:  NITROSTAT Place 1 tablet (0.4 mg total) under the tongue every 5 (five) minutes as needed for chest pain.   pantoprazole 40 MG tablet Commonly known as:  PROTONIX Take 1 tablet by mouth daily.   potassium chloride SA 20 MEQ tablet Commonly known as:  K-DUR,KLOR-CON TAKE 2 TABLETS BY MOUTH EVERY DAY What changed:  Another medication with the same name was removed. Continue taking this medication, and follow the directions you see here.   sennosides-docusate sodium 8.6-50 MG  tablet Commonly known as:  SENOKOT-S Take 1 tablet by mouth 2 (two) times daily.   temazepam 15 MG capsule Commonly known as:  RESTORIL Take 15 mg by mouth at bedtime.      Follow-up Information    HOLWERDA, Lorin PicketSCOTT, MD. Schedule an appointment as soon as possible for a visit in 1 week(s).   Specialty:  Internal Medicine Contact information: [address] [place] [address] [phone]          Allergies  Allergen Reactions  . Latex Other (See Comments)    Had reaction to latex foley catheter, but ok with gloves  . Morphine And Related Nausea Only  . Neosporin [Neomycin-Polymyxin-Gramicidin] Rash    Consultations:   cardiology  Other Procedures/Studies: Dg Chest 2 View  Result Date: 08/03/2016 CLINICAL DATA:  Mid chest pain, shortness of breath, dizziness, and diaphoresis this morning. Patient reports similar symptoms with the previous MI. History of NSTEMI, diabetes, hypertension, former smoker. Previous CABG. EXAM: CHEST  2 VIEW COMPARISON:  Chest x-ray of August 29, 2015 FINDINGS: The lungs are well-expanded. There is no focal infiltrate. There is no pleural effusion, pneumothorax, or pneumomediastinum. The heart and pulmonary vascularity are normal. The mediastinum is normal in width. There are post CABG changes. There is calcification in the wall of the aortic arch. The bony thorax exhibits no acute abnormality. IMPRESSION: Chronic bronchitic changes and changes related to previous CABG. No pneumonia, CHF, nor other acute cardiopulmonary abnormality. Thoracic aortic atherosclerosis. Electronically Signed   By: David  Swaziland[place] M.D.   On: 08/03/2016 14:17   Nm Myocar Multi W/spect W/wall Motion / Ef  Result Date: 08/04/2016  There was no ST segment deviation noted during stress.  The study is normal.  This is a low risk study.  The left ventricular ejection fraction is normal (55-65%).  Normal pharmacologic nuclear stress test with no evidence of prior infarct or  ischemia.      TODAY-DAY OF DISCHARGE:  Subjective:   Timothy MouldWayne Cervantes today has no headache,no chest abdominal pain,no new weakness tingling or numbness, feels much better wants to go home today.   Objective:   Blood pressure (!) 122/59, pulse 64, temperature 98.6 F (37 C), temperature source Oral, resp. rate 17, height 6\' 2"  (1.88 m), weight 99.1 kg (218 lb 8 oz), SpO2 98 %.  Intake/Output Summary (Last 24 hours) at 08/04/16 1612 Last data filed at 08/04/16 1400  Gross per 24 hour  Intake              240 ml  Output             1010 ml  Net             -770 ml   Filed Weights   08/03/16 1257 08/03/16 2008  Weight: 102.1 kg (225 lb) 99.1 kg (218 lb 8 oz)    Exam: Awake Alert, Oriented *3, No new F.N deficits, Normal affect [place].AT,PERRAL Supple Neck,No JVD, No cervical  lymphadenopathy appriciated.  Symmetrical Chest wall movement, Good air movement bilaterally, CTAB RRR,No Gallops,Rubs or new Murmurs, No Parasternal Heave +ve B.Sounds, Abd Soft, Non tender, No organomegaly appriciated, No rebound -guarding or rigidity. No Cyanosis, Clubbing or edema, No new Rash or bruise   PERTINENT RADIOLOGIC STUDIES: Dg Chest 2 View  Result Date: 08/03/2016 CLINICAL DATA:  Mid chest pain, shortness of breath, dizziness, and diaphoresis this morning. Patient reports similar symptoms with the previous MI. History of NSTEMI, diabetes, hypertension, former smoker. Previous CABG. EXAM: CHEST  2 VIEW COMPARISON:  Chest x-ray of August 29, 2015 FINDINGS: The lungs are well-expanded. There is no focal infiltrate. There is no pleural effusion, pneumothorax, or pneumomediastinum. The heart and pulmonary vascularity are normal. The mediastinum is normal in width. There are post CABG changes. There is calcification in the wall of the aortic arch. The bony thorax exhibits no acute abnormality. IMPRESSION: Chronic bronchitic changes and changes related to previous CABG. No pneumonia, CHF, nor other acute  cardiopulmonary abnormality. Thoracic aortic atherosclerosis. Electronically Signed   By: David  [place] M.D.   On: 08/03/2016 14:17   Nm Myocar Multi W/spect W/wall Motion / Ef  Result Date: 08/04/2016  There was no ST segment deviation noted during stress.  The study is normal.  This is a low risk study.  The left ventricular ejection fraction is normal (55-65%).  Normal pharmacologic nuclear stress test with no evidence of prior infarct or ischemia.     PERTINENT LAB RESULTS: CBC:  Recent Labs  08/03/16 1332  WBC 4.3  HGB 13.4  HCT 40.9  PLT 222   CMET CMP     Component Value Date/Time   NA 137 08/03/2016 1332   K 4.5 08/03/2016 1332   CL 106 08/03/2016 1332   CO2 24 08/03/2016 1332   GLUCOSE 108 (H) 08/03/2016 1332   BUN 16 08/03/2016 1332   CREATININE 0.94 08/03/2016 1332   CALCIUM 9.1 08/03/2016 1332   PROT 7.5 10/31/2012 0821   ALBUMIN 3.8 10/31/2012 0821   AST 38 (H) 10/31/2012 0821   ALT 35 10/31/2012 0821   ALKPHOS 64 10/31/2012 0821   BILITOT 1.5 (H) 10/31/2012 0821   GFRNONAA >60 08/03/2016 1332   GFRAA >60 08/03/2016 1332    GFR Estimated Creatinine Clearance: 88.1 mL/min (by C-G formula based on SCr of 0.94 mg/dL). No results for input(s): LIPASE, AMYLASE in the last 72 hours.  Recent Labs  08/03/16 2052 08/04/16 0153 08/04/16 0811  TROPONINI <0.03 <0.03 <0.03   Invalid input(s): POCBNP  Recent Labs  08/03/16 1332  DDIMER 0.34   No results for input(s): HGBA1C in the last 72 hours.  Recent Labs  08/04/16 0153  CHOL 94  HDL 31*  LDLCALC 48  TRIG 75  CHOLHDL 3.0   No results for input(s): TSH, T4TOTAL, T3FREE, THYROIDAB in the last 72 hours.  Invalid input(s): FREET3 No results for input(s): VITAMINB12, FOLATE, FERRITIN, TIBC, IRON, RETICCTPCT in the last 72 hours. Coags: No results for input(s): INR in the last 72 hours.  Invalid input(s): PT Microbiology: No results found for this or any previous visit (from the past 240  hour(s)).  FURTHER DISCHARGE INSTRUCTIONS:  Get Medicines reviewed and adjusted: Please take all your medications with you for your next visit with your Primary MD  Laboratory/radiological data: Please request your Primary MD to go over all hospital tests and procedure/radiological results at the follow up, please ask your Primary MD to get all Hospital records sent to his/her  office.  In some cases, they will be blood work, cultures and biopsy results pending at the time of your discharge. Please request that your primary care M.D. goes through all the records of your hospital data and follows up on these results.  Also Note the following: If you experience worsening of your admission symptoms, develop shortness of breath, life threatening emergency, suicidal or homicidal thoughts you must seek medical attention immediately by calling 911 or calling your MD immediately  if symptoms less severe.  You must read complete instructions/literature along with all the possible adverse reactions/side effects for all the Medicines you take and that have been prescribed to you. Take any new Medicines after you have completely understood and accpet all the possible adverse reactions/side effects.   Do not drive when taking Pain medications or sleeping medications (Benzodaizepines)  Do not take more than prescribed Pain, Sleep and Anxiety Medications. It is not advisable to combine anxiety,sleep and pain medications without talking with your primary care practitioner  Special Instructions: If you have smoked or chewed Tobacco  in the last 2 yrs please stop smoking, stop any regular Alcohol  and or any Recreational drug use.  Wear Seat belts while driving.  Please note: You were cared for by a hospitalist during your hospital stay. Once you are discharged, your primary care physician will handle any further medical issues. Please note that NO REFILLS for any discharge medications will be authorized once  you are discharged, as it is imperative that you return to your primary care physician (or establish a relationship with a primary care physician if you do not have one) for your post hospital discharge needs so that they can reassess your need for medications and monitor your lab values.  Total Time spent coordinating discharge including counseling, education and face to face time equals 25 minutes. SignedJeoffrey Massed 08/04/2016 4:12 PM

## 2016-08-17 ENCOUNTER — Ambulatory Visit (INDEPENDENT_AMBULATORY_CARE_PROVIDER_SITE_OTHER): Payer: PPO | Admitting: Neurology

## 2016-08-17 ENCOUNTER — Encounter: Payer: Self-pay | Admitting: Neurology

## 2016-08-17 VITALS — BP 104/48 | HR 70 | Resp 18 | Ht 73.0 in | Wt 222.0 lb

## 2016-08-17 DIAGNOSIS — Z951 Presence of aortocoronary bypass graft: Secondary | ICD-10-CM

## 2016-08-17 DIAGNOSIS — G2581 Restless legs syndrome: Secondary | ICD-10-CM | POA: Diagnosis not present

## 2016-08-17 DIAGNOSIS — E663 Overweight: Secondary | ICD-10-CM | POA: Diagnosis not present

## 2016-08-17 DIAGNOSIS — R079 Chest pain, unspecified: Secondary | ICD-10-CM | POA: Diagnosis not present

## 2016-08-17 DIAGNOSIS — Z9989 Dependence on other enabling machines and devices: Secondary | ICD-10-CM

## 2016-08-17 DIAGNOSIS — G4733 Obstructive sleep apnea (adult) (pediatric): Secondary | ICD-10-CM

## 2016-08-17 DIAGNOSIS — R351 Nocturia: Secondary | ICD-10-CM | POA: Diagnosis not present

## 2016-08-17 NOTE — Progress Notes (Signed)
Subjective:    Patient ID: Timothy Cervantes is a 76 y.o. male.  HPI     Huston Foley, MD, PhD [facility] [address] [address] [place], [place] [address]  Dear Dr. Link Snuffer,   I saw your patient, Timothy Cervantes, upon your kind request in my neurologic clinic today for initial consultation of his obstructive sleep apnea and particularly reevaluation thereof. The patient is unaccompanied today. As you know, Mr. Timothy Cervantes is a 76 year old right-handed gentleman with an underlying medical history of overweight state, degenerative disc disease of the lumbar spine, reflux disease, history of back surgery in 2015 under Dr. Danielle Dess, coronary artery disease, status post CABG 5 in 2014, hypertension, diabetes, recent hospital stay for chest pain in March 2018, who carries a long-standing diagnosis of obstructive sleep apnea and has been on CPAP therapy for nearly 20 years but has not had a re-evaluation in many years. He was in the hospital from 08/03/2016 through 08/04/2016 secondary to sudden onset of chest pain and shortness of breath, balance problems, diaphoresis. He had cardiac workup including nuclear stress test which showed no ischemia and enzymes were negative. I reviewed your office note from 08/13/2016, which you kindly included. Prior sleep study results are not available for my review today. Compliance data is not available for my review, he has an older CPAP machine. Epworth sleepiness score currently is 10 out of 24, fatigue score is 38 out of 63. He reports a significant amount of weight loss since his original sleep apnea diagnosis of or around 50 pounds. He is married and lives with his wife. He has 2 grown children, 2 sons (lost middle son in infancy d/t drowning).  He works as a Agricultural consultant at [employer], will be starting employment in April 2018. He is retired from [employer].  He smokes occasionally, he drinks alcohol occasionally, he drinks caffeine  in the form of sodas. He reports nocturia about once per average night, he has occasional morning headaches. Bedtime is between 9:30 and 10 PM, wakeup time around 5 AM. He stays very busy. He does report some restless leg symptoms.   His Past Medical History Is Significant For: Past Medical History:  Diagnosis Date  . Anxiety   . Back pain, chronic   . CAD (coronary artery disease)    a. s/p NSTEMI 07/2012 => LHC with 3v CAD => s/p CABG 08/25/12 (LIMA-LAD, SVG-DX, SVG-OM1/OM2, SVG-PDA) Dr. Tyrone Sage;  b.  Echo 08/24/12:  Mild LVH, EF 65-70%, mild RVE, PASP 36  . Chronic back pain   . Chronic pain   . Complication of anesthesia    hard to wake up  . Depression   . DJD (degenerative joint disease)   . Fatty liver   . Fibromyalgia   . GERD (gastroesophageal reflux disease)   . History of tobacco abuse    a. 57 year hx, quit 03/2012.  [name] HLD (hyperlipidemia)   . Hypertension   . NSTEMI (non-ST elevated myocardial infarction) (HCC) 07/2012  . Osteoarthritis   . Shortness of breath dyspnea    with extertion  . Sleep apnea    uses CPAP every night  . Sleep apnea   . Type II diabetes mellitus (HCC) a. Dx in 2010    His Past Surgical History Is Significant For: Past Surgical History:  Procedure Laterality Date  . CHOLECYSTECTOMY    . CORONARY ARTERY BYPASS GRAFT N/A 08/25/2012   Procedure: CORONARY ARTERY BYPASS GRAFTING (CABG);  Surgeon: Ramon Dredge  Bari Edward, MD;  Location: [facility] OR;  Service: Open Heart Surgery;  Laterality: N/A;  Coronary artery bypass graft times five using left internal mammary artery and bilateral saphenous leg vein using endoscope.  . INTRAOPERATIVE TRANSESOPHAGEAL ECHOCARDIOGRAM N/A 08/25/2012   Procedure: INTRAOPERATIVE TRANSESOPHAGEAL ECHOCARDIOGRAM;  Surgeon: Delight Ovens, MD;  Location: [facility] OR;  Service: Open Heart Surgery;  Laterality: N/A;  . LEFT HEART CATHETERIZATION WITH CORONARY ANGIOGRAM N/A 08/23/2012   Procedure: LEFT HEART CATHETERIZATION WITH CORONARY  ANGIOGRAM;  Surgeon: Kathleene Hazel, MD;  Location: [facility] CATH LAB;  Service: Cardiovascular;  Laterality: N/A;  . NASAL SINUS SURGERY    . PAROTIDECTOMY Left    parotid gland resection-non cancerous  . Right Ankle Surgery    . ROTATOR CUFF REPAIR Bilateral   . SHOULDER SURGERY Bilateral   . TRANSURETHRAL RESECTION OF PROSTATE    . TRIGGER FINGER RELEASE Left 06/24/2015   Procedure: RELEASE A-1 PULLEY LEFT LONG AND LEFT RING FINGERS;  Surgeon: Cindee Salt, MD;  Location: [facility];  Service: Orthopedics;  Laterality: Left;  . TRIGGER FINGER RELEASE Right 07/29/2015   Procedure: RELEASE A-1 PULLEY RIGHT INDEX FINGER, RIGHT MIDDLE FINGER, RIGHT RING FINGER, RIGHT SMALL FINGER;  Surgeon: Cindee Salt, MD;  Location: [facility];  Service: Orthopedics;  Laterality: Right;  [name] Vocal Cord Tumor resection      His Family History Is Significant For: Family History  Problem Relation Age of Onset  . Lung cancer Mother     died @ 11  . Heart attack Father     died @ 61  . Other Sister     A&W in her 53's  . Bladder Cancer Brother     alive    His Social History Is Significant For: Social History   Social History  . Marital status: Married    Spouse name: N/A  . Number of children: 2  . Years of education: college   Occupational History  . [employer]     Social History Main Topics  . Smoking status: Current Some Day Smoker    Packs/day: 1.00    Years: 57.00    Types: Cigarettes  . Smokeless tobacco: Former Neurosurgeon    Types: Chew     Comment: smoked from the age of 6.  Quit 03/2012  . Alcohol use Yes     Comment: social  . Drug use: No  . Sexual activity: Not Asked   Other Topics Concern  . None   Social History Narrative   Lives in [place] with wife.  Retired.   Drinks coke daily     His Allergies Are:  Allergies  Allergen Reactions  . Latex Other (See Comments)    Had reaction to latex foley catheter, but ok with gloves  . Morphine And  Related Nausea Only  . Neosporin [Neomycin-Polymyxin-Gramicidin] Rash  :   Her Current Medications Are:  Outpatient Encounter Prescriptions as of 08/17/2016  Medication Sig  . acetaminophen (TYLENOL) 325 MG tablet Take 2 tablets (650 mg total) by mouth every 4 (four) hours as needed for headache or mild pain.  [name] aspirin 81 MG tablet Take 1 tablet (81 mg total) by mouth daily.  [name] atorvastatin (LIPITOR) 40 MG tablet Take 40 mg by mouth daily.  . Buprenorphine (BUTRANS) 15 MCG/HR PTWK Place 1 patch onto the skin once a week.  [name] buPROPion (WELLBUTRIN SR) 150 MG 12 hr tablet Take 1 tablet by mouth every morning.  . cholecalciferol (  VITAMIN D) 1000 UNITS tablet Take 1,000 Units by mouth daily.  [name] FLUoxetine (PROZAC) 40 MG capsule Take 40 mg by mouth daily.  . furosemide (LASIX) 40 MG tablet TAKE 1 TABLET BY MOUTH TWICE DAILY (Patient taking differently: TAKE 1 TABLET BY MOUTH ONCE DAILY)  . metFORMIN (GLUCOPHAGE) 500 MG tablet Take 500 mg by mouth 2 (two) times daily with a meal.   . nitroGLYCERIN (NITROSTAT) 0.4 MG SL tablet Place 1 tablet (0.4 mg total) under the tongue every 5 (five) minutes as needed for chest pain.  . pantoprazole (PROTONIX) 40 MG tablet Take 1 tablet by mouth daily.  . potassium chloride SA (K-DUR,KLOR-CON) 20 MEQ tablet TAKE 2 TABLETS BY MOUTH EVERY DAY  . sennosides-docusate sodium (SENOKOT-S) 8.6-50 MG tablet Take 1 tablet by mouth 2 (two) times daily.  . temazepam (RESTORIL) 15 MG capsule Take 15 mg by mouth at bedtime.   No facility-administered encounter medications on file as of 08/17/2016.   :  Review of Systems:  Out of a complete 14 point review of systems, all are reviewed and negative with the exception of these symptoms as listed below:  Review of Systems  Neurological:       Patient states that his last sleep study was about 10 years ago. He currently uses CPAP.  Denies taking naps. States that he gets tired when sitting still.  He has lost weight since  last study.     Epworth Sleepiness Scale 0= would never doze 1= slight chance of dozing 2= moderate chance of dozing 3= high chance of dozing  Sitting and reading:2 Watching TV:2 Sitting inactive in a public place (ex. Theater or meeting):0 As a passenger in a car for an hour without a break:3 Lying down to rest in the afternoon:0 Sitting and talking to someone:0 Sitting quietly after lunch (no alcohol):3 In a car, while stopped in traffic:0 Total:10   Objective:  Neurologic Exam  Physical Exam Physical Examination:   Vitals:   08/17/16 1335  BP: (!) 104/48  Pulse: 70  Resp: 18   General Examination: The patient is a very pleasant 76 y.o. male in no acute distress. He appears well-developed and well-nourished and well groomed.   HEENT: Normocephalic, atraumatic, pupils are equal, round and reactive to light and accommodation. Funduscopic exam is normal with sharp disc margins noted. Extraocular tracking is good without limitation to gaze excursion or nystagmus noted. Normal smooth pursuit is noted. Hearing is grossly intact. Tympanic membranes are clear bilaterally. Face is symmetric with normal facial animation and normal facial sensation. Speech is clear with no dysarthria noted. There is no hypophonia. There is no lip, neck/head, jaw or voice tremor. Neck is supple with full range of passive and active motion. There are no carotid bruits on auscultation. Oropharynx exam reveals: mild mouth dryness, adequate dental hygiene and moderate airway crowding. Mallampati is class II. Tongue protrudes centrally and palate elevates symmetrically. Neck size is 18.25 inches. He has a Mild overbite. Nasal inspection reveals no significant nasal mucosal bogginess or redness and no septal deviation.   Chest: Clear to auscultation without wheezing, rhonchi or crackles noted.  Heart: S1+S2+0, regular and normal without murmurs, rubs or gallops noted.   Abdomen: Soft, non-tender and  non-distended with normal bowel sounds appreciated on auscultation.  Extremities: There is no pitting edema in the distal lower extremities bilaterally. Pedal pulses are intact.  Skin: Warm and dry without trophic changes noted.  Musculoskeletal: exam reveals no obvious joint deformities, tenderness or  joint swelling or erythema.   Neurologically:  Mental status: The patient is awake, alert and oriented in all 4 spheres. His immediate and remote memory, attention, language skills and fund of knowledge are appropriate. There is no evidence of aphasia, agnosia, apraxia or anomia. Speech is clear with normal prosody and enunciation. Thought process is linear. Mood is normal and affect is normal.  Cranial nerves II - XII are as described above under HEENT exam. In addition: shoulder shrug is normal with equal shoulder height noted. Motor exam: Normal bulk, strength and tone is noted. There is no drift, tremor or rebound. Romberg is negative. Reflexes are 2+ throughout. Fine motor skills and coordination: intact with normal finger taps, normal hand movements, normal rapid alternating patting, normal foot taps and normal foot agility.  Cerebellar testing: No dysmetria or intention tremor on finger to nose testing. Heel to shin is unremarkable bilaterally. There is no truncal or gait ataxia.  Sensory exam: intact to light touch in the upper and lower extremities.  Gait, station and balance: He stands easily. No veering to one side is noted. No leaning to one side is noted. Posture is age-appropriate and stance is narrow based. Gait shows normal stride length and normal pace. No problems turning are noted. Tandem walk is slightly challenging for him.   Assessment and Plan:   In summary, Mellody MemosWayne T Woodrome is a very pleasant 76 y.o.-year old male with an underlying medical history of overweight state, degenerative disc disease of the lumbar spine, reflux disease, history of back surgery in 2015 under Dr.  Danielle DessElsner, coronary artery disease, status post CABG 5 in 2014, hypertension, diabetes, recent hospital stay for chest pain in March 2018, whose history and physical exam are in keeping with OSA. He carries a long-standing diagnosis of obstructive sleep apnea and has been on CPAP therapy with compliance reported. Unfortunately, prior test results are not available and CPAP compliance data is also not possible from the current machine which is older. He would benefit from reevaluation and refitting with updated supplies and from updating his CPAP machine. This would require repeat sleep study testing. He is willing to proceed. He also reports intermittent restless leg symptoms, we will be on the lookout for PLMS during his sleep study.  I had a long chat with the patient about my findings and the diagnosis of OSA, its prognosis and treatment options. We talked about medical treatments, surgical interventions and non-pharmacological approaches. I explained in particular the risks and ramifications of untreated moderate to severe OSA, especially with respect to developing cardiovascular disease down the Road, including congestive heart failure, difficult to treat hypertension, cardiac arrhythmias, or stroke. Even type 2 diabetes has, in part, been linked to untreated OSA. Symptoms of untreated OSA include daytime sleepiness, memory problems, mood irritability and mood disorder such as depression and anxiety, lack of energy, as well as recurrent headaches, especially morning headaches. We talked about smoking cessation and trying to maintain a healthy lifestyle in general, as well as the importance of weight control. I encouraged the patient to eat healthy, exercise daily and keep well hydrated, to keep a scheduled bedtime and wake time routine, to not skip any meals and eat healthy snacks in between meals. I advised the patient not to drive when feeling sleepy. I recommended the following at this time: sleep study with  potential positive airway pressure titration. (We will score hypopneas at 4%).   I explained the sleep test procedure to the patient and also outlined  possible surgical and non-surgical treatment options of OSA, including the use of a custom-made dental device (which would require a referral to a specialist dentist or oral surgeon), upper airway surgical options, such as pillar implants, radiofrequency surgery, tongue base surgery, and UPPP (which would involve a referral to an ENT surgeon). Rarely, jaw surgery such as mandibular advancement may be considered.  I also explained the CPAP treatment option to the patient, who indicated that he would be willing to continue to use CPAP. I explained the importance of being compliant with PAP treatment, not only for insurance purposes but primarily to improve His symptoms, and for the patient's long term health benefit, including to reduce His cardiovascular risks. I answered all his questions today and the patient was in agreement. I would like to see him back after the sleep study is completed and encouraged him to call with any interim questions, concerns, problems or updates.   Thank you very much for allowing me to participate in the care of this nice patient. If I can be of any further assistance to you please do not hesitate to call me at [phone].  Sincerely,   Huston Foley, MD, PhD

## 2016-08-17 NOTE — Patient Instructions (Addendum)
Based on your symptoms and your exam I believe you are still at risk for obstructive sleep apnea or OSA, and I think we should proceed with a sleep study to determine whether you do or do not have OSA and how severe it is. If you have more than mild OSA, I want you to consider treatment with CPAP. Please remember, the risks and ramifications of moderate to severe obstructive sleep apnea or OSA are: Cardiovascular disease, including congestive heart failure, stroke, difficult to control hypertension, arrhythmias, and even type 2 diabetes has been linked to untreated OSA. Sleep apnea causes disruption of sleep and sleep deprivation in most cases, which, in turn, can cause recurrent headaches, problems with memory, mood, concentration, focus, and vigilance. Most people with untreated sleep apnea report excessive daytime sleepiness, which can affect their ability to drive. Please do not drive if you feel sleepy.   I will likely see you back after your sleep study to go over the test results and where to go from there. We will call you after your sleep study to advise about the results (most likely, you will hear from Diana, my nurse) and to set up an appointment at the time, as necessary.    Our sleep lab administrative assistant, Dawn will meet with you or call you to schedule your sleep study. If you don't hear back from her by next week please feel free to call her at [phone]. This is her direct line and please leave a message with your phone number to call back if you get the voicemail box. She will call back as soon as possible.   

## 2016-09-02 DIAGNOSIS — R0789 Other chest pain: Secondary | ICD-10-CM | POA: Diagnosis not present

## 2016-09-02 DIAGNOSIS — Z6829 Body mass index (BMI) 29.0-29.9, adult: Secondary | ICD-10-CM | POA: Diagnosis not present

## 2016-09-02 DIAGNOSIS — G4739 Other sleep apnea: Secondary | ICD-10-CM | POA: Diagnosis not present

## 2016-09-02 DIAGNOSIS — E784 Other hyperlipidemia: Secondary | ICD-10-CM | POA: Diagnosis not present

## 2016-09-02 DIAGNOSIS — R5383 Other fatigue: Secondary | ICD-10-CM | POA: Diagnosis not present

## 2016-09-02 DIAGNOSIS — E119 Type 2 diabetes mellitus without complications: Secondary | ICD-10-CM | POA: Diagnosis not present

## 2016-09-02 DIAGNOSIS — I10 Essential (primary) hypertension: Secondary | ICD-10-CM | POA: Diagnosis not present

## 2016-09-21 ENCOUNTER — Ambulatory Visit (INDEPENDENT_AMBULATORY_CARE_PROVIDER_SITE_OTHER): Payer: PPO | Admitting: Neurology

## 2016-09-21 DIAGNOSIS — R9431 Abnormal electrocardiogram [ECG] [EKG]: Secondary | ICD-10-CM

## 2016-09-21 DIAGNOSIS — G4733 Obstructive sleep apnea (adult) (pediatric): Secondary | ICD-10-CM

## 2016-09-21 DIAGNOSIS — G4720 Circadian rhythm sleep disorder, unspecified type: Secondary | ICD-10-CM

## 2016-09-21 DIAGNOSIS — G4761 Periodic limb movement disorder: Secondary | ICD-10-CM

## 2016-09-23 ENCOUNTER — Telehealth: Payer: Self-pay

## 2016-09-23 NOTE — Progress Notes (Signed)
Diana:  Patient referred by Dr. Link Snuffer, seen by me on 08/17/16, split study on 09/21/16. Please call and notify patient that the recent sleep study confirmed the diagnosis of OSA. He did well with CPAP during the study with improvement of the respiratory events. Therefore, I would like to prescribe a new CPAP machine. He may have an existing DME, has been on CPAP therapy at home for years. I placed the order in the chart. The patient will need a follow up appointment with me in 10 weeks post set up that has to be scheduled; please go ahead and schedule while you have the patient on the phone and make sure patient understands the importance of keeping this window for the FU appointment, as it is often an insurance requirement and failing to adhere to this may result in losing coverage for sleep apnea treatment.  Please re-enforce the importance of compliance with treatment and the need for [place] to monitor compliance data - again an insurance requirement and good feedback for the patient as far as how they are doing.  Also remind patient, that any upcoming CPAP machine or mask issues, should be first addressed with the DME company. Please ask if patient has a preference regarding DME company.  Please arrange for CPAP set up at home through a DME company of patient's choice - once you have spoken to the patient - and faxed/routed report to PCP and referring MD (if other than PCP), you can close this encounter, thanks,   Huston Foley, MD, PhD [facility]

## 2016-09-23 NOTE — Telephone Encounter (Signed)
I sent study to PCP. I sent referral to [facility]. Letter sent to patient.

## 2016-09-23 NOTE — Telephone Encounter (Signed)
-----   Message from Huston Foley, MD sent at 09/23/2016  8:27 AM EDT ----- Timothy Cervantes:  Patient referred by Dr. Link Snuffer, seen by me on 08/17/16, split study on 09/21/16. Please call and notify patient that the recent sleep study confirmed the diagnosis of OSA. He did well with CPAP during the study with improvement of the respiratory events. Therefore, I would like to prescribe a new CPAP machine. He may have an existing DME, has been on CPAP therapy at home for years. I placed the order in the chart. The patient will need a follow up appointment with me in 10 weeks post set up that has to be scheduled; please go ahead and schedule while you have the patient on the phone and make sure patient understands the importance of keeping this window for the FU appointment, as it is often an insurance requirement and failing to adhere to this may result in losing coverage for sleep apnea treatment.  Please re-enforce the importance of compliance with treatment and the need for [place] to monitor compliance data - again an insurance requirement and good feedback for the patient as far as how they are doing.  Also remind patient, that any upcoming CPAP machine or mask issues, should be first addressed with the DME company. Please ask if patient has a preference regarding DME company.  Please arrange for CPAP set up at home through a DME company of patient's choice - once you have spoken to the patient - and faxed/routed report to PCP and referring MD (if other than PCP), you can close this encounter, thanks,   Huston Foley, MD, PhD [facility]

## 2016-09-23 NOTE — Addendum Note (Signed)
Addended by: Huston Foley on: 09/23/2016 08:27 AM   Modules accepted: Orders

## 2016-09-23 NOTE — Procedures (Signed)
PATIENT'S NAME:  Timothy Cervantes, Timothy Cervantes DOB:      07/19/1940      MR#:    [id number]     DATE OF RECORDING: 09/21/2016 REFERRING M.D.:  Alysia Penna, MD Study Performed:  Split-Night Titration Study HISTORY: 76 year old man with a history of overweight state, degenerative disc disease of the lumbar spine, reflux disease, history of back surgery in 2015 under Dr. Danielle Dess, coronary artery disease, status post CABG 5 in 2014, hypertension, diabetes, recent hospital stay for chest pain in March 2018, who carries a long-standing diagnosis of obstructive sleep apnea and has been on CPAP therapy for nearly 20 years but has not had a re-evaluation in many years. The patient endorsed the Epworth Sleepiness Scale at 10/24 points. The patient's weight 222 pounds with a height of 73 (inches), resulting in a BMI of 29.5 kg/m2. The patient's neck circumference measured 17 inches.  CURRENT MEDICATIONS: Acetaminophen, Aspirin, Atorvastatin, Buprenorphine, Cholecalciferol, Fluoxetine, Furosemide, Metformin, Nitroglycerin, Pantoprazole, Potassium chloride and Temazepam   PROCEDURE:  This is a multichannel digital polysomnogram utilizing the Somnostar 11.2 system.  Electrodes and sensors were applied and monitored per AASM Specifications.   EEG, EOG, Chin and Limb EMG, were sampled at 200 Hz.  ECG, Snore and Nasal Pressure, Thermal Airflow, Respiratory Effort, CPAP Flow and Pressure, Oximetry was sampled at 50 Hz. Digital video and audio were recorded.      BASELINE STUDY WITHOUT CPAP RESULTS:  Lights Out was at 21:38 and Lights On at 05:01, for the night.  Total recording time (TRT) was 203.5, with a total sleep time (TST) of 167 minutes.   The patient's sleep latency was 31.5 minutes, which is delayed.  REM latency was 100.5 minutes, which is normal.  The sleep efficiency was 82.1 %.    SLEEP ARCHITECTURE: WASO (Wake after sleep onset) was 18.5 minutes with mild sleep fragmentation noted, Stage N1 was 15 minutes, Stage  N2 was 131 minutes, Stage N3 was 0 minutes and Stage R (REM sleep) was 21 minutes.  The percentages were Stage N1 9.%, which is elevated, Stage N2 78.4%, which is markedly elevated, Stage N3 was absent, and Stage R (REM sleep) 12.6%.   The arousals were noted as: 10 were spontaneous, 15 were associated with PLMs, 28 were associated with respiratory events.   Audio and video analysis did not show any abnormal or unusual movements, behaviors, but phonations and  vocalizations were noted.  The patient took 2 bathroom breaks. Mild to moderate snoring was noted. The EKG was in keeping with normal sinus rhythm (NSR) with frequent PVCs noted.   RESPIRATORY ANALYSIS:  There were a total of 28 respiratory events:  1 obstructive apneas, 0 central apneas and 0 mixed apneas with a total of 1 apneas and an apnea index (AI) of .4. There were 27 hypopneas with a hypopnea index of 9.7. The patient also had 0 respiratory event related arousals (RERAs).  Snoring was noted.     The total APNEA/HYPOPNEA INDEX (AHI) was 10.1 /hour and the total RESPIRATORY DISTURBANCE INDEX was 10.1 /hour.  2 events occurred in REM sleep and 50 events in NREM. The REM AHI was 5.7, /hour versus a non-REM AHI of 10.7 /hour. The patient spent 147 minutes sleep time in the supine position 223 minutes in non-supine. The supine AHI was 22.7 /hour versus a non-supine AHI of 1.8 /hour.  OXYGEN SATURATION & C02:  The wake baseline 02 saturation was 98%, with the lowest being 89%. Time spent below 89% saturation  equaled 0 minutes.  PERIODIC LIMB MOVEMENTS: The patient had a total of 146 Periodic Limb Movements.  The Periodic Limb Movement (PLM) index was 52.5 /hour and the PLM Arousal index was 5.4 /hour.  TITRATION STUDY WITH CPAP RESULTS:   The patient used his own nasal mask. CPAP was initiated at 5 cmH20 with heated humidity per AASM split night standards and pressure was advanced to 7 cmH20 because of hypopneas, apneas and desaturations.  At  a PAP pressure of 7 cmH20, there was a reduction of the AHI to 0/hour, O2 nadir of 96%, supine sleep achieved; supine REM sleep was achieved on 6 cm.   Total recording time (TRT) was 240 minutes, with a total sleep time (TST) of 202.5 minutes. The patient's sleep latency was 2 minutes. REM latency was 61 minutes.  The sleep efficiency was 84.4 %.    SLEEP ARCHITECTURE: Wake after sleep was 35.5 minutes, Stage N1 17.5 minutes, Stage N2 145.5 minutes, Stage N3 0 minutes and Stage R (REM sleep) 39.5 minutes. The percentages were: Stage N1 8.6%, Stage N2 71.9%, Stage N3 0% and Stage R (REM sleep) 19.5%.   The arousals were noted as: 21 were spontaneous, 0 were associated with PLMs, 0 were associated with respiratory events.  RESPIRATORY ANALYSIS:  There were a total of 0 respiratory events: 0 obstructive apneas, 0 central apneas and 0 mixed apneas with a total of 0 apneas and an apnea index (AI) of 0. There were 0 hypopneas with a hypopnea index of 0 /hour. The patient also had 0 respiratory event related arousals (RERAs).      The total APNEA/HYPOPNEA INDEX  (AHI) was 0 /hour and the total RESPIRATORY DISTURBANCE INDEX was 0 /hour.  0 events occurred in REM sleep and 0 events in NREM. The REM AHI was 0 /hour versus a non-REM AHI of 0 /hour. REM sleep was achieved on a pressure of  cm/h2o (AHI was  .) The patient spent 40% of total sleep time in the supine position. The supine AHI was 0.0 /hour, versus a non-supine AHI of 0.0/hour.  OXYGEN SATURATION & C02:  The wake baseline 02 saturation was 96%, with the lowest being 93%. Time spent below 89% saturation equaled 0 minutes.  PERIODIC LIMB MOVEMENTS: The patient had a total of 0 Periodic Limb Movements. The Periodic Limb Movement (PLM) index was 0 /hour and the PLM Arousal index was 0 /hour.   Post-study, the patient indicated that sleep was the same as usual.  POLYSOMNOGRAPHY IMPRESSION :   1. Obstructive Sleep Apnea (OSA)  2. Non-specific abnormal  EKG 3. Dysfunctions associated with sleep stages or arousals from sleep 4. Periodic Limb Movement Disorder (PLMD)  RECOMMENDATIONS:  1. This study confirms the patient's longstanding history of obstructive sleep apnea. He did well on CPAP therapy. I will, therefore, order a new home CPAP machine, utilizing a nasal mask of his preference, set pressure of 7 cm with heated humidity. The patient should be reminded to be fully compliant with PAP therapy to improve sleep related symptoms and decrease long term cardiovascular risks. Please note that untreated obstructive sleep apnea carries additional perioperative morbidity. Patients with significant obstructive sleep apnea should receive perioperative PAP therapy and the surgeons and particularly the anesthesiologist should be informed of the diagnosis and the severity of the sleep disordered breathing. 2. Severe PLMs (periodic limb movements of sleep) were noted during the baseline portion of the study only and with mild arousals; clinical correlation is recommended. Medication effect  from the patient's antidepressant medication should be considered.  3. The study showed frequent PVCs on single lead EKG; clinical correlation is recommended.  4. This study shows sleep fragmentation and abnormal sleep stage percentages; these are nonspecific findings and per se do not signify an intrinsic sleep disorder or a cause for the patient's sleep-related symptoms. Causes include (but are not limited to) the first night effect of the sleep study, circadian rhythm disturbances, medication effect or an underlying mood disorder or medical problem.  5. The patient should be cautioned not to drive, work at heights, or operate dangerous or heavy equipment when tired or sleepy. Review and reiteration of good sleep hygiene measures should be pursued with any patient. 6. The patient will be seen in follow-up by Dr. Frances Furbish at [facility] for discussion of the test results and further  management strategies. The referring provider will be notified of the test results.   I certify that I have reviewed the entire raw data recording prior to the issuance of this report in accordance with the Standards of Accreditation of the American Academy of Sleep Medicine (AASM)    Huston Foley, MD, PhD Diplomat, American Board of Psychiatry and Neurology (Neurology and Sleep Medicine)

## 2016-09-23 NOTE — Telephone Encounter (Signed)
I called pt. I advised pt that Dr. Frances Furbish reviewed their sleep study results and found that pt does have osa. Dr. Frances Furbish recommends that pt start a cpap at home. I reviewed PAP compliance expectations with the pt. Pt is agreeable to starting a CPAP. I advised pt that an order will be sent to a DME, [facility], and [facility] will call the pt within about one week after they file with the pt's insurance. [facility] will show the pt how to use the machine, fit for masks, and troubleshoot the CPAP if needed. A follow up appt was made for insurance purposes with Dr. Frances Furbish on Tuesday, July 24th, 2018 at 8:30am. Pt verbalized understanding to arrive 15 minutes early and bring their CPAP. A letter with all of this information in it will be mailed to the pt as a reminder. I verified with the pt that the address we have on file is correct. Pt verbalized understanding of results. Pt had no questions at this time but was encouraged to call back if questions arise.

## 2016-09-24 DIAGNOSIS — L03119 Cellulitis of unspecified part of limb: Secondary | ICD-10-CM | POA: Diagnosis not present

## 2016-10-12 DIAGNOSIS — I10 Essential (primary) hypertension: Secondary | ICD-10-CM | POA: Diagnosis not present

## 2016-10-12 DIAGNOSIS — E119 Type 2 diabetes mellitus without complications: Secondary | ICD-10-CM | POA: Diagnosis not present

## 2016-10-12 DIAGNOSIS — E784 Other hyperlipidemia: Secondary | ICD-10-CM | POA: Diagnosis not present

## 2016-10-12 DIAGNOSIS — Z125 Encounter for screening for malignant neoplasm of prostate: Secondary | ICD-10-CM | POA: Diagnosis not present

## 2016-10-12 DIAGNOSIS — R8299 Other abnormal findings in urine: Secondary | ICD-10-CM | POA: Diagnosis not present

## 2016-10-19 DIAGNOSIS — E784 Other hyperlipidemia: Secondary | ICD-10-CM | POA: Diagnosis not present

## 2016-10-19 DIAGNOSIS — F418 Other specified anxiety disorders: Secondary | ICD-10-CM | POA: Diagnosis not present

## 2016-10-19 DIAGNOSIS — I509 Heart failure, unspecified: Secondary | ICD-10-CM | POA: Diagnosis not present

## 2016-10-19 DIAGNOSIS — Z1212 Encounter for screening for malignant neoplasm of rectum: Secondary | ICD-10-CM | POA: Diagnosis not present

## 2016-10-19 DIAGNOSIS — R3121 Asymptomatic microscopic hematuria: Secondary | ICD-10-CM | POA: Diagnosis not present

## 2016-10-19 DIAGNOSIS — I10 Essential (primary) hypertension: Secondary | ICD-10-CM | POA: Diagnosis not present

## 2016-10-19 DIAGNOSIS — Z0000 Encounter for general adult medical examination without abnormal findings: Secondary | ICD-10-CM | POA: Diagnosis not present

## 2016-10-19 DIAGNOSIS — G4709 Other insomnia: Secondary | ICD-10-CM | POA: Diagnosis not present

## 2016-10-19 DIAGNOSIS — G4739 Other sleep apnea: Secondary | ICD-10-CM | POA: Diagnosis not present

## 2016-10-19 DIAGNOSIS — I25118 Atherosclerotic heart disease of native coronary artery with other forms of angina pectoris: Secondary | ICD-10-CM | POA: Diagnosis not present

## 2016-10-19 DIAGNOSIS — Z1389 Encounter for screening for other disorder: Secondary | ICD-10-CM | POA: Diagnosis not present

## 2016-10-19 DIAGNOSIS — Z6829 Body mass index (BMI) 29.0-29.9, adult: Secondary | ICD-10-CM | POA: Diagnosis not present

## 2016-10-29 DIAGNOSIS — G4733 Obstructive sleep apnea (adult) (pediatric): Secondary | ICD-10-CM | POA: Diagnosis not present

## 2016-11-03 ENCOUNTER — Other Ambulatory Visit: Payer: Self-pay | Admitting: Cardiovascular Disease

## 2016-11-24 ENCOUNTER — Encounter: Payer: Self-pay | Admitting: Internal Medicine

## 2016-11-24 DIAGNOSIS — H524 Presbyopia: Secondary | ICD-10-CM | POA: Diagnosis not present

## 2016-11-24 DIAGNOSIS — H25813 Combined forms of age-related cataract, bilateral: Secondary | ICD-10-CM | POA: Diagnosis not present

## 2016-11-28 DIAGNOSIS — G4733 Obstructive sleep apnea (adult) (pediatric): Secondary | ICD-10-CM | POA: Diagnosis not present

## 2016-12-20 DIAGNOSIS — H2513 Age-related nuclear cataract, bilateral: Secondary | ICD-10-CM | POA: Diagnosis not present

## 2016-12-20 DIAGNOSIS — H2512 Age-related nuclear cataract, left eye: Secondary | ICD-10-CM | POA: Diagnosis not present

## 2016-12-20 DIAGNOSIS — H2511 Age-related nuclear cataract, right eye: Secondary | ICD-10-CM | POA: Diagnosis not present

## 2016-12-21 ENCOUNTER — Ambulatory Visit: Payer: Self-pay | Admitting: Neurology

## 2016-12-21 ENCOUNTER — Telehealth: Payer: Self-pay

## 2016-12-21 NOTE — Telephone Encounter (Signed)
Pt did not show for their appt with Dr. Athar today.  

## 2016-12-26 ENCOUNTER — Other Ambulatory Visit: Payer: Self-pay | Admitting: Cardiovascular Disease

## 2016-12-27 ENCOUNTER — Encounter: Payer: Self-pay | Admitting: Neurology

## 2016-12-27 NOTE — Telephone Encounter (Signed)
2. Chronic diastolic CHF: LE edema is stable. Continue Lasix 40 mg po BID

## 2016-12-29 DIAGNOSIS — H25811 Combined forms of age-related cataract, right eye: Secondary | ICD-10-CM | POA: Diagnosis not present

## 2016-12-29 DIAGNOSIS — G4733 Obstructive sleep apnea (adult) (pediatric): Secondary | ICD-10-CM | POA: Diagnosis not present

## 2016-12-29 DIAGNOSIS — H2511 Age-related nuclear cataract, right eye: Secondary | ICD-10-CM | POA: Diagnosis not present

## 2017-01-29 DIAGNOSIS — G4733 Obstructive sleep apnea (adult) (pediatric): Secondary | ICD-10-CM | POA: Diagnosis not present

## 2017-02-02 DIAGNOSIS — H25812 Combined forms of age-related cataract, left eye: Secondary | ICD-10-CM | POA: Diagnosis not present

## 2017-02-02 DIAGNOSIS — H2512 Age-related nuclear cataract, left eye: Secondary | ICD-10-CM | POA: Diagnosis not present

## 2017-02-21 DIAGNOSIS — E119 Type 2 diabetes mellitus without complications: Secondary | ICD-10-CM | POA: Diagnosis not present

## 2017-02-21 DIAGNOSIS — R55 Syncope and collapse: Secondary | ICD-10-CM | POA: Diagnosis not present

## 2017-02-21 DIAGNOSIS — I10 Essential (primary) hypertension: Secondary | ICD-10-CM | POA: Diagnosis not present

## 2017-02-21 DIAGNOSIS — R3121 Asymptomatic microscopic hematuria: Secondary | ICD-10-CM | POA: Diagnosis not present

## 2017-02-21 DIAGNOSIS — Z6828 Body mass index (BMI) 28.0-28.9, adult: Secondary | ICD-10-CM | POA: Diagnosis not present

## 2017-02-21 DIAGNOSIS — E784 Other hyperlipidemia: Secondary | ICD-10-CM | POA: Diagnosis not present

## 2017-02-21 DIAGNOSIS — M5136 Other intervertebral disc degeneration, lumbar region: Secondary | ICD-10-CM | POA: Diagnosis not present

## 2017-02-28 DIAGNOSIS — G4733 Obstructive sleep apnea (adult) (pediatric): Secondary | ICD-10-CM | POA: Diagnosis not present

## 2017-03-31 DIAGNOSIS — 419620001 Death: Secondary | SNOMED CT | POA: Diagnosis not present

## 2017-03-31 DEATH — deceased
